# Patient Record
Sex: Male | Born: 2000
Health system: Southern US, Academic
[De-identification: ages and names within clinical notes are randomized; demographics above are authoritative.]

## PROBLEM LIST (undated history)

## (undated) ENCOUNTER — Ambulatory Visit

## (undated) ENCOUNTER — Encounter

## (undated) ENCOUNTER — Encounter: Attending: Dermatology | Primary: Dermatology

## (undated) ENCOUNTER — Telehealth

## (undated) ENCOUNTER — Ambulatory Visit: Payer: Medicaid (Managed Care)

## (undated) ENCOUNTER — Telehealth
Attending: Student in an Organized Health Care Education/Training Program | Primary: Student in an Organized Health Care Education/Training Program

## (undated) ENCOUNTER — Ambulatory Visit: Payer: BLUE CROSS/BLUE SHIELD

## (undated) ENCOUNTER — Ambulatory Visit: Payer: BLUE CROSS/BLUE SHIELD | Attending: Dermatology | Primary: Dermatology

## (undated) ENCOUNTER — Ambulatory Visit
Payer: BLUE CROSS/BLUE SHIELD | Attending: Student in an Organized Health Care Education/Training Program | Primary: Student in an Organized Health Care Education/Training Program

## (undated) ENCOUNTER — Ambulatory Visit: Attending: Pharmacist | Primary: Pharmacist

## (undated) DIAGNOSIS — L309 Dermatitis, unspecified: Secondary | ICD-10-CM

## (undated) DIAGNOSIS — Z91018 Allergy to other foods: Secondary | ICD-10-CM

## (undated) DIAGNOSIS — J45909 Unspecified asthma, uncomplicated: Secondary | ICD-10-CM

## (undated) HISTORY — PX: OTHER SURGICAL HISTORY: SHX169

## (undated) HISTORY — DX: Allergy to other foods: Z91.018

## (undated) NOTE — *Deleted (*Deleted)
Asthma Continue Advair Diskus 250 mg-take 1 puff every 12 hours to prevent coughing or wheezing Ventolin 2 puffs every 4 hours if needed for wheezing or coughing spells or instead albuterol 0.083% 1 unit dose every 4 hours if needed Use Ventolin 2 puffs 5-15 minutes before exercise to decrease cough or wheeze  Allergic rhinitis Begin fluticasone 2 sprays per nostril once a day if needed for stuffy nose Begin saline nasal rinses as needed for nasal symptoms. Use this before any medicated nasal sprays for best result Begin Mucinex 600-1200 mg twice a day for and increase hydration as tolerated until your nasal symptoms are resolved Continue cetirizine 10 mg-take 1 tablet once a day only if needed for runny nose for itching   Atopic dermatitis Continue on the treatment of his eczema prescribed by his dermatologist Continue dupilumab 300 mg once every 2 weeks  Food allergy Continue to avoid fish, shellfish, peanut and lentils.  If he has an allergic reaction give Benadryl 50 mg every 4 hours and if he has life-threatening symptoms inject with EpiPen 0.3 mg  Continue the other medications as listed in the chart  Call the clinic if this treatment plan is not working well for you  Follow up in 2 months or sooner if needed 

---
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
## 2018-01-01 ENCOUNTER — Ambulatory Visit (INDEPENDENT_AMBULATORY_CARE_PROVIDER_SITE_OTHER): Payer: Medicaid Other

## 2018-01-01 DIAGNOSIS — L209 Atopic dermatitis, unspecified: Secondary | ICD-10-CM | POA: Diagnosis not present

## 2018-01-01 MED FILL — DUPIXENT (ML)/300MG/2ML/SOSY: DUPIXENT (ML)/300MG/2ML/SOSY | 28 days supply | Qty: 4 | Fill #4

## 2018-01-01 NOTE — Unmapped (Signed)
Spoke with Barry Bond.  He says Barry Bond is doing well on the Dupixent  His next dose is for next Wednesday-sending delivery out on Friday     [facility] Refill Coordination Note    Specialty Medication(s) to be Shipped:   Inflammatory Disorders: Dupixent    Other medication(s) to be shipped: n/a     Barry Bond., DOB: August 09, 2000  Phone: [phone] (home)   Shipping Address: [address] [place] [address]  [place] [address]    All above HIPAA information was verified with patient.     Completed refill call assessment today to schedule patient's medication shipment from the [facility] [phone]).       Specialty medication(s) and dose(s) confirmed: Regimen is correct and unchanged.   Changes to medications: Barry Bond reports no changes reported at this time.  Changes to insurance: No  Questions for the pharmacist: No    The patient will receive an FSI print out for each medication shipped and additional FDA Medication Guides as required.  Patient education from [place] or Barry Bond may also be included in the shipment.    DISEASE-SPECIFIC INFORMATION        For Inflammatory disorders patients on injectable medications: Patient currently has 0 doses left.  Next injection is scheduled for 7/17.    ADHERENCE     Medication Adherence    Patient reported X missed doses in the last month:  0  Specialty Medication:  DUPIXENT  Patient is on additional specialty medications:  No  Patient is on more than two specialty medications:  No  Any gaps in refill history greater than 2 weeks in the last 3 months:  no  Demonstrates understanding of importance of adherence:  yes  Informant:  father  Reliability of informant:  reliable  Support network for adherence:  family member  Confirmed plan for next specialty medication refill:  delivery by pharmacy  Refills needed for supportive medications:  not needed          Refill Coordination    Has the Patients' Contact Information Changed: No  Is the Shipping Address Different:  No         SHIPPING     Shipping address confirmed in FSI.     Delivery Scheduled: Yes, Expected medication delivery date: 7/12 via UPS or courier.     Barry Bond   [facility] Shared [facility] Specialty Technician

## 2018-01-01 NOTE — Progress Notes (Signed)
Patient came in for Dupixent injection.  Gave 300 mg subcutaneous right arm.  Patient waited in clinic x 30 minutes.  No reaction noted.  Next appointment scheduled for 01/15/18.  Patient has EpiPen 0.3 mg available if needed.

## 2018-01-15 ENCOUNTER — Ambulatory Visit (INDEPENDENT_AMBULATORY_CARE_PROVIDER_SITE_OTHER): Payer: Medicaid Other

## 2018-01-15 DIAGNOSIS — L209 Atopic dermatitis, unspecified: Secondary | ICD-10-CM | POA: Diagnosis not present

## 2018-01-15 NOTE — Progress Notes (Signed)
Immunotherapy   Patient Details  Name: Travis Morrison MRN: [id number] Date of Birth: May 01, 2001  01/15/2018  Travis Morrison  DUPIXENT 300 MG/2ML SUBCUTANEOUS INJECTION.  GIVEN 2 ML LEFT ARM.   Following schedule: EVERY 2 WEEKS  Frequency:EVERY 2 WEEKS Epi-Pen:PATIENT STATES HE HAS EPI-PEN AND BENADRYL AVAILABLE IF NEEDED. Consent signed and patient instructions given. Patient waited in clinic x 30 minutes.  No reaction noted.  Naveah Brave Michail JewelsMarsh 01/15/2018,

## 2018-01-29 ENCOUNTER — Ambulatory Visit (INDEPENDENT_AMBULATORY_CARE_PROVIDER_SITE_OTHER): Payer: Medicaid Other

## 2018-01-29 DIAGNOSIS — L209 Atopic dermatitis, unspecified: Secondary | ICD-10-CM | POA: Diagnosis not present

## 2018-01-29 NOTE — Progress Notes (Signed)
Pt given Dupixent 300 mg/642ml today with no problems

## 2018-01-31 NOTE — Unmapped (Signed)
Spoke with Barry Bond's father  He is doing well on the dupixent so far  They have one left to use this Wednesday    [facility] Refill Coordination Note    Specialty Medication(s) to be Shipped:   Inflammatory Disorders: Dupixent    Other medication(s) to be shipped: n/a     Barry Bond., DOB: [birth year]  Phone: [phone] (home)   Shipping Address: [address]  [place] [address]    All above HIPAA information was verified with patient's father     Completed refill call assessment today to schedule patient's medication shipment from the [facility] [phone]).       Specialty medication(s) and dose(s) confirmed: Regimen is correct and unchanged.   Changes to medications: [name] reports no changes reported at this time.  Changes to insurance: No  Questions for the pharmacist: No    The patient will receive an FSI print out for each medication shipped and additional FDA Medication Guides as required.  Patient education from [place] or Barry Bond may also be included in the shipment.    DISEASE/MEDICATION-SPECIFIC INFORMATION        For Inflammatory disorders patients on injectable medications: Patient currently has 1 doses left.  Next injection is scheduled for wednesday.    ADHERENCE     Medication Adherence    Patient reported X missed doses in the last month:  0  Specialty Medication:  DUPIXENT  Patient is on additional specialty medications:  No  Patient is on more than two specialty medications:  No  Any gaps in refill history greater than 2 weeks in the last 3 months:  no  Demonstrates understanding of importance of adherence:  yes  Informant:  father  Reliability of informant:  reliable  Support network for adherence:  family member  Confirmed plan for next specialty medication refill:  delivery by pharmacy  Refills needed for supportive medications:  not needed          Refill Coordination    Has the Patients' Contact Information Changed:  No Is the Shipping Address Different:  No           SHIPPING     Shipping address confirmed in Epic.     Delivery Scheduled: Yes, Expected medication delivery date: 8/13 via UPS or courier.     Barry Bond   [facility] Shared [facility] Specialty Technician

## 2018-02-03 MED FILL — DUPIXENT 300 MG/2 ML SUBCUTANEOUS SYRINGE: 28 days supply | Qty: 4 | Fill #0 | Status: AC

## 2018-02-03 MED FILL — DUPIXENT 300 MG/2 ML SUBCUTANEOUS SYRINGE: 28 days supply | Qty: 4 | Fill #0

## 2018-02-12 ENCOUNTER — Ambulatory Visit (INDEPENDENT_AMBULATORY_CARE_PROVIDER_SITE_OTHER): Payer: Medicaid Other | Admitting: *Deleted

## 2018-02-12 DIAGNOSIS — L209 Atopic dermatitis, unspecified: Secondary | ICD-10-CM

## 2018-02-18 ENCOUNTER — Encounter: Payer: Self-pay | Admitting: Pediatrics

## 2018-02-18 ENCOUNTER — Ambulatory Visit (INDEPENDENT_AMBULATORY_CARE_PROVIDER_SITE_OTHER): Payer: Medicaid Other | Admitting: Pediatrics

## 2018-02-18 VITALS — BP 124/70 | HR 72 | Temp 97.7°F | Resp 12 | Ht 66.0 in | Wt 180.1 lb

## 2018-02-18 DIAGNOSIS — L2089 Other atopic dermatitis: Secondary | ICD-10-CM

## 2018-02-18 DIAGNOSIS — J4540 Moderate persistent asthma, uncomplicated: Secondary | ICD-10-CM | POA: Diagnosis not present

## 2018-02-18 DIAGNOSIS — T7800XD Anaphylactic reaction due to unspecified food, subsequent encounter: Secondary | ICD-10-CM

## 2018-02-18 DIAGNOSIS — J301 Allergic rhinitis due to pollen: Secondary | ICD-10-CM

## 2018-02-18 MED ORDER — EPINEPHRINE 0.3 MG/0.3ML IJ SOAJ: INTRAMUSCULAR | 1 refills | Status: DC

## 2018-02-18 MED ORDER — FLUTICASONE-SALMETEROL 250-50 MCG/DOSE IN AEPB: INHALATION_SPRAY | RESPIRATORY_TRACT | 5 refills | Status: DC

## 2018-02-18 MED ORDER — CETIRIZINE HCL 10 MG PO TABS: ORAL_TABLET | ORAL | 5 refills | Status: DC

## 2018-02-18 MED ORDER — ALBUTEROL SULFATE HFA 108 (90 BASE) MCG/ACT IN AERS: 2.0000 | INHALATION_SPRAY | RESPIRATORY_TRACT | 1 refills | Status: DC | PRN

## 2018-02-18 MED ORDER — ALBUTEROL SULFATE (2.5 MG/3ML) 0.083% IN NEBU: INHALATION_SOLUTION | RESPIRATORY_TRACT | 1 refills | Status: DC

## 2018-02-18 MED ORDER — FLUTICASONE PROPIONATE 50 MCG/ACT NA SUSP: NASAL | 5 refills | Status: DC

## 2018-02-18 NOTE — Progress Notes (Signed)
[address] [place]  [id number] Dept: [phone]  FOLLOW UP NOTE  Patient ID: Travis Saunaslejandro Deahl, male    DOB: 05-05-2001  Age: 17 y.o. MRN: [id number] Date of Office Visit: 02/18/2018  Assessment  Chief Complaint: Asthma (doing well) and Eczema (doing well)  HPI Travis Morrison presents for follow-up of asthma, allergic rhinitis, eczema and food allergies.  He has had an excellent response to dupilumab with regard to his asthma and eczema.  He now avoids fish, shellfish, peanut and lentils.  He is on Advair Diskus 250--1 puff every 12 hours and cetirizine 10 mg once a day.  [facility]   Drug Allergies:  Allergies  Allergen Reactions  . Cyproheptadine Other (See Comments)    Other Reaction: Other reaction Other Reaction: Other reaction   . Diphenhydramine Hcl Swelling  . Orange (Diagnostic) Other (See Comments)    Other Reaction: Other reaction  . Peanuts [Peanut Oil] Anaphylaxis    Pt has an epi pen  . Soy Allergy Anaphylaxis  . Citric Acid Other (See Comments)  . Eggs Or Egg-Derived Products Other (See Comments)  . Fish Allergy     Unknown  . Fish-Derived Products Other (See Comments)    Unknown Other reaction(s): UNKNOWN   . Hydroxyzine Other (See Comments)  . Lactose Intolerance (Gi)   . Other     Grains: Unknown  . Shellfish Allergy     Unknown Unknown  . Wheat Extract Other (See Comments)    Other Reaction: Not Assessed  . Cephalexin Rash  . Nickel Other (See Comments) and Rash    Other Reaction: Not Assessed     Physical Exam: BP 124/70 (BP Location: Left Arm, Patient Position: Sitting, Cuff Size: Normal)   Pulse 72   Temp 97.7 F (36.5 C) (Oral)   Resp 12   Ht 5\' 6"  (1.676 m)   Wt 180 lb 1.9 oz (81.7 kg)   SpO2 100%   BMI 29.07 kg/m    Physical Exam  Constitutional: He is oriented to person, place, and time. He appears well-developed and well-nourished.  HENT:  Eyes  normal.  Ears normal.  Nose normal.  Pharynx normal.  Neck: Neck supple.  Cardiovascular:  S1-S2 normal no murmurs  Pulmonary/Chest:  Clear to percussion and auscultation  Lymphadenopathy:    He has no cervical adenopathy.  Neurological: He is alert and oriented to person, place, and time.  Skin:  Dry.  Very mild eczematoid changes on his face arms and legs  Psychiatric: He has a normal mood and affect. His behavior is normal. Judgment and thought content normal.  Vitals reviewed.   Diagnostics: FVC 4.05 L FEV1 3.84 L.  Predicted FVC 4.85 L predicted FEV1 4.18 L-the spirometry is in the normal range  Assessment and Plan: 1. Moderate persistent asthma without complication   2. Anaphylactic shock due to food, subsequent encounter   3. Flexural atopic dermatitis   4. Seasonal allergic rhinitis due to pollen     Meds ordered this encounter  Medications  . EPINEPHrine (EPIPEN 2-PAK) 0.3 mg/0.3 mL IJ SOAJ injection    Sig: Use as directed for severe allergic reactions    Dispense:  4 Device    Refill:  1    Dispense mylan generic brand only.  Marland Kitchen. albuterol (PROVENTIL) (2.5 MG/3ML) 0.083% nebulizer solution    Sig: 1 vial in nebulizer every 4 hours as needed for coughing or wheezing.  Dispense:  75 mL    Refill:  1  . cetirizine (ZYRTEC) 10 MG tablet    Sig: 1 tablet once a day if needed for runny nose or itchy eyes    Dispense:  34 tablet    Refill:  5  . fluticasone (FLONASE) 50 MCG/ACT nasal spray    Sig: 1 spray per nostril once a day if needed for stuffy nose.    Dispense:  16 g    Refill:  5  . albuterol (VENTOLIN HFA) 108 (90 Base) MCG/ACT inhaler    Sig: Inhale 2 puffs into the lungs every 4 (four) hours as needed.    Dispense:  2 Inhaler    Refill:  1    One for home and school..  . Fluticasone-Salmeterol (ADVAIR DISKUS) 250-50 MCG/DOSE AEPB    Sig: One puff twice a day to prevent coughing or wheezing. Rinse, gargle and spit out after use    Dispense:  1 each     Refill:  5    Patient Instructions  Cetirizine 10 mg-take 1 tablet once a day if needed for runny nose for itching Fluticasone 2 sprays per nostril once a day if needed for stuffy nose  Advair Diskus 250 mg-take 1 puff every 12 hours to prevent coughing or wheezing Ventolin 2 puffs every 4 hours if needed for wheezing or coughing spells or instead albuterol 0.083% 1 unit dose every 4 hours if needed Continue on the treatment of his eczema prescribed by his dermatologist Continue dupilumab  Avoid fish, shellfish, peanut and lentils.  If he has an allergic reaction give Benadryl 50 mg every 4 hours and if he has life-threatening symptoms inject with EpiPen 0.3 mg   Return in about 6 months (around 08/21/2018).    Thank you for the opportunity to care for this patient.  Please do not hesitate to contact me with questions.  Tonette Bihari, M.D.  [facility] [address] [place], [place] [address] [phone]

## 2018-02-18 NOTE — Patient Instructions (Addendum)
Cetirizine 10 mg-take 1 tablet once a day if needed for runny nose for itching Fluticasone 2 sprays per nostril once a day if needed for stuffy nose  Advair Diskus 250 mg-take 1 puff every 12 hours to prevent coughing or wheezing Ventolin 2 puffs every 4 hours if needed for wheezing or coughing spells or instead albuterol 0.083% 1 unit dose every 4 hours if needed Continue on the treatment of his eczema prescribed by his dermatologist Continue dupilumab  Avoid fish, shellfish, peanut and lentils.  If he has an allergic reaction give Benadryl 50 mg every 4 hours and if he has life-threatening symptoms inject with EpiPen 0.3 mg

## 2018-02-26 ENCOUNTER — Ambulatory Visit (INDEPENDENT_AMBULATORY_CARE_PROVIDER_SITE_OTHER): Payer: Medicaid Other

## 2018-02-26 DIAGNOSIS — L209 Atopic dermatitis, unspecified: Secondary | ICD-10-CM | POA: Diagnosis not present

## 2018-02-26 NOTE — Progress Notes (Signed)
Immunotherapy   Patient Details  Name: Travis Morrison MRN: [id number] Date of Birth: [birth year]  02/26/2018  [name] received Dupixent inj today  Frequency: next appointment in 2 weeks Epi-Pen:Epi-Pen Available  Consent signed and patient instructions given.   Damita Gainey 02/26/2018, 9:26 AM

## 2018-03-05 NOTE — Unmapped (Signed)
Fadi's father reported he is doing well, and only occasionally needs topical steroids for minor eczema spots. He is happy with treatment, no medication issues identified.    [facility] Special[facility] Pharmacy Refill and Clinical Coordination Note  Medication(s): Dupixent    Ioane Bhola., DOB: Mar 30, 2001  Phone: [phone] (home) , Alternate phone contact: N/A  Shipping address: [address]  [place] [address]  Phone or address changes today?: No  All above HIPAA information verified.  Insurance changes? No    Completed refill and clinical call assessment today to schedule patient's medication shipment from the [facility] [phone]).      MEDICATION RECONCILIATION    Confirmed the medication and dosage are correct and have not changed: Yes, regimen is correct and unchanged.    Were there any changes to your medication(s) in the past month:  No, there are no changes reported at this time.    ADHERENCE    Is this medicine transplant or covered by Medicare Part B? No.    Did you miss any doses in the past 4 weeks? No missed doses reported.  Adherence counseling provided? Not needed     SIDE EFFECT MANAGEMENT    Are you tolerating your medication?:  Eythan reports tolerating the medication.  Side effect management discussed: None      Therapy is appropriate and should be continued.    Evidence of clinical benefit: See Epic note from 12/02/17      FINANCIAL/SHIPPING    Delivery Scheduled: Yes, Expected medication delivery date: Friday, Sept 13     Additional medications refilled: No additional medications/refills needed at this time.    The patient will receive a drug information handout for each medication shipped and additional FDA Medication Guides as required.      Donat did not have any additional questions at this time.    Delivery address confirmed in Epic.     We will follow up with patient monthly for standard refill processing and delivery.      Thank you, Tawanna Solo [facility] Specialty Pharmacist

## 2018-03-06 MED FILL — DUPIXENT 300 MG/2 ML SUBCUTANEOUS SYRINGE: 28 days supply | Qty: 4 | Fill #1

## 2018-03-06 MED FILL — DUPIXENT 300 MG/2 ML SUBCUTANEOUS SYRINGE: 28 days supply | Qty: 4 | Fill #1 | Status: AC

## 2018-03-12 ENCOUNTER — Ambulatory Visit (INDEPENDENT_AMBULATORY_CARE_PROVIDER_SITE_OTHER): Payer: Medicaid Other

## 2018-03-12 DIAGNOSIS — L209 Atopic dermatitis, unspecified: Secondary | ICD-10-CM | POA: Diagnosis not present

## 2018-03-12 NOTE — Progress Notes (Signed)
Immunotherapy   Patient Details  Name: Travis Morrison MRN: [id number] Date of Birth: [birth year]  03/12/2018  Travis Morrison  dupixent 300mg /172ml given in the office Following schedule: every 2 weeks  Frequency:every 2 weeks Epi-Pen:Epi-Pen Available  Consent signed and patient instructions given.   Murray HodgkinsMichelle Ernesto Zukowski 03/12/2018, 3:49 PM

## 2018-03-26 ENCOUNTER — Ambulatory Visit (INDEPENDENT_AMBULATORY_CARE_PROVIDER_SITE_OTHER): Payer: Medicaid Other

## 2018-03-26 DIAGNOSIS — L209 Atopic dermatitis, unspecified: Secondary | ICD-10-CM | POA: Diagnosis not present

## 2018-03-26 NOTE — Unmapped (Signed)
[facility] Specialty Pharmacy Refill Coordination Note  Specialty Medication(s): Dupixent 300mg /60ml  Additional Medications shipped: none    Barry Bond., DOB: 08/26/2000  Phone: [phone] (home) , Alternate phone contact: N/A  Phone or address changes today?: No  All above HIPAA information was verified with patient's family member.  Shipping Address: [address]  [name] [address]   Insurance changes? No    Completed refill call assessment today to schedule patient's medication shipment from the [facility] [phone]).      Confirmed the medication and dosage are correct and have not changed: Yes, regimen is correct and unchanged.    Confirmed patient started or stopped the following medications in the past month:  No, there are no changes reported at this time.    Are you tolerating your medication?:  Cobey reports tolerating the medication.    ADHERENCE    Did you miss any doses in the past 4 weeks? No missed doses reported.    FINANCIAL/SHIPPING    Delivery Scheduled: Yes, Expected medication delivery date: 04/04/18     The patient will receive a drug information handout for each medication shipped and additional FDA Medication Guides as required.      Ashraf did not have any additional questions at this time.    Delivery address validated in Epic.    We will follow up with patient monthly for standard refill processing and delivery.      Thank you,  Lupita Shutter   [facility] Specialty Pharmacist

## 2018-03-26 NOTE — Progress Notes (Signed)
Immunotherapy   Patient Details  Name: Trinton Prewitt MRN: [id number] Date of Birth: 2000-08-22  03/26/2018  [name] DUPIXENT 300 MG EVERY 14 DAYS Following schedule: EVERY 14 DAYS  Frequency:EVERY 14 DAYS Epi-Pen:PATIENT HAS EPIPEN 0.3 MG AVAILABLE IF NEEDED Consent signed and patient instructions given.  Patient waited in clinic x 30 minutes.  No reaction noted.  Next injection 04/09/18.   Kassey Laforest 03/26/2018, 9:35 AM

## 2018-04-03 MED FILL — DUPIXENT 300 MG/2 ML SUBCUTANEOUS SYRINGE: 28 days supply | Qty: 4 | Fill #2 | Status: AC

## 2018-04-03 MED FILL — DUPIXENT 300 MG/2 ML SUBCUTANEOUS SYRINGE: 28 days supply | Qty: 4 | Fill #2

## 2018-04-09 ENCOUNTER — Ambulatory Visit (INDEPENDENT_AMBULATORY_CARE_PROVIDER_SITE_OTHER): Payer: Medicaid Other | Admitting: *Deleted

## 2018-04-09 DIAGNOSIS — L209 Atopic dermatitis, unspecified: Secondary | ICD-10-CM | POA: Diagnosis not present

## 2018-04-23 ENCOUNTER — Ambulatory Visit (INDEPENDENT_AMBULATORY_CARE_PROVIDER_SITE_OTHER): Payer: Medicaid Other | Admitting: *Deleted

## 2018-04-23 DIAGNOSIS — L209 Atopic dermatitis, unspecified: Secondary | ICD-10-CM | POA: Diagnosis not present

## 2018-04-28 NOTE — Unmapped (Signed)
[facility] Specialty Pharmacy Refill Coordination Note    Specialty Medication(s) to be Shipped:   Inflammatory Disorders: Dupixent    Other medication(s) to be shipped: n/a     Barry Bond., DOB: Sep 26, 2000  Phone: [phone] (home)       All above HIPAA information was verified with Barry Bond's father     Completed refill call assessment today to schedule patient's medication shipment from the [facility] [phone]).       Specialty medication(s) and dose(s) confirmed: Regimen is correct and unchanged.   Changes to medications: Barry Bond reports no changes reported at this time.  Changes to insurance: No  Questions for the pharmacist: No    The patient will receive a drug information handout for each medication shipped and additional FDA Medication Guides as required.      DISEASE/MEDICATION-SPECIFIC INFORMATION        For Inflammatory disorders patients on injectable medications: Patient currently has 0 doses left.  Next injection is scheduled for next week.    ADHERENCE     Medication Adherence    Patient reported X missed doses in the last month:  0  Specialty Medication:  dupixent  Patient is on additional specialty medications:  No  Patient is on more than two specialty medications:  No  Any gaps in refill history greater than 2 weeks in the last 3 months:  no  Demonstrates understanding of importance of adherence:  yes  Informant:  father  Reliability of informant:  reliable          Support network for adherence:  family member      Confirmed plan for next specialty medication refill:  delivery by pharmacy  Refills needed for supportive medications:  not needed          Refill Coordination    Has the Patients' Contact Information Changed:  No  Is the Shipping Address Different:  No         SHIPPING     Shipping address confirmed in Epic.     Delivery Scheduled: Yes, Expected medication delivery date: 11/7 via UPS or courier.     Medication will be delivered via UPS to the home address in Epic WAM.    Barry Bond   Barry Bond [facility] Specialty Technician

## 2018-04-30 MED FILL — DUPIXENT 300 MG/2 ML SUBCUTANEOUS SYRINGE: 28 days supply | Qty: 4 | Fill #3 | Status: AC

## 2018-04-30 MED FILL — DUPIXENT 300 MG/2 ML SUBCUTANEOUS SYRINGE: 28 days supply | Qty: 4 | Fill #3

## 2018-05-07 ENCOUNTER — Ambulatory Visit (INDEPENDENT_AMBULATORY_CARE_PROVIDER_SITE_OTHER): Payer: Medicaid Other | Admitting: *Deleted

## 2018-05-07 DIAGNOSIS — L209 Atopic dermatitis, unspecified: Secondary | ICD-10-CM

## 2018-05-21 ENCOUNTER — Ambulatory Visit (INDEPENDENT_AMBULATORY_CARE_PROVIDER_SITE_OTHER): Payer: Medicaid Other

## 2018-05-21 DIAGNOSIS — L209 Atopic dermatitis, unspecified: Secondary | ICD-10-CM | POA: Diagnosis not present

## 2018-05-30 NOTE — Unmapped (Signed)
[facility] Specialty Pharmacy Refill Coordination Note  Specialty Medication(s): Dupixent 300 mg/2 mL  Additional Medications shipped: none    [name]., DOB: [birth year]  Phone: [phone] (home) , Alternate phone contact: N/A  Phone or address changes today?: No  All above HIPAA information was verified with patient's family member.  Shipping Address: [address]  [name] [address]   Insurance changes? No    Completed refill call assessment today to schedule patient's medication shipment from the [facility] [phone]).      Confirmed the medication and dosage are correct and have not changed: Yes, regimen is correct and unchanged.    Confirmed patient started or stopped the following medications in the past month:  No, there are no changes reported at this time.    Are you tolerating your medication?:  Prabhav reports tolerating the medication.    ADHERENCE    Did you miss any doses in the past 4 weeks? No missed doses reported.    FINANCIAL/SHIPPING    Delivery Scheduled: Yes, Expected medication delivery date: 06/03/18     Medication will be delivered via UPS to the home address in [facility].    The patient will receive a drug information handout for each medication shipped and additional FDA Medication Guides as required.      Tayjon did not have any additional questions at this time.    We will follow up with patient monthly for standard refill processing and delivery.      Thank you,  Verdia Kuba   [facility] Specialty Pharmacist

## 2018-06-02 MED FILL — DUPIXENT 300 MG/2 ML SUBCUTANEOUS SYRINGE: 28 days supply | Qty: 4 | Fill #4 | Status: AC

## 2018-06-02 MED FILL — DUPIXENT 300 MG/2 ML SUBCUTANEOUS SYRINGE: 28 days supply | Qty: 4 | Fill #4

## 2018-06-04 ENCOUNTER — Ambulatory Visit (INDEPENDENT_AMBULATORY_CARE_PROVIDER_SITE_OTHER): Payer: Medicaid Other

## 2018-06-04 DIAGNOSIS — L209 Atopic dermatitis, unspecified: Secondary | ICD-10-CM

## 2018-06-05 MED ORDER — CLOBETASOL 0.05 % TOPICAL OINTMENT
OPHTHALMIC | 1 refills | 0.00000 days | Status: CP
Start: 2018-06-05 — End: ?

## 2018-06-17 ENCOUNTER — Ambulatory Visit (INDEPENDENT_AMBULATORY_CARE_PROVIDER_SITE_OTHER): Payer: Medicaid Other

## 2018-06-17 DIAGNOSIS — L209 Atopic dermatitis, unspecified: Secondary | ICD-10-CM

## 2018-06-24 NOTE — Unmapped (Signed)
[facility] Specialty Pharmacy Refill Coordination Note    Specialty Medication(s) to be Shipped:   Inflammatory Disorders: Dupixent       Barry Bond., DOB: 24-Sep-2000  Phone: [phone] (home)       All above HIPAA information was verified with patient's family member.     Completed refill call assessment today to schedule patient's medication shipment from the [facility] [phone]).       Specialty medication(s) and dose(s) confirmed: Regimen is correct and unchanged.   Changes to medications: Younis reports no changes reported at this time.  Changes to insurance: No  Questions for the pharmacist: No    The patient will receive a drug information handout for each medication shipped and additional FDA Medication Guides as required.      DISEASE/MEDICATION-SPECIFIC INFORMATION        For patients on injectable medications: Patient currently has 0 doses left.  Next injection is scheduled for 07/01/2018.    ADHERENCE     Medication Adherence    Patient reported X missed doses in the last month:  0  Specialty Medication:  DUPIXENT 300 mg/2 mL Syrg injection  Patient is on additional specialty medications:  No  Patient is on more than two specialty medications:  No  Any gaps in refill history greater than 2 weeks in the last 3 months:  no  Demonstrates understanding of importance of adherence:  yes  Informant:  father  Reliability of informant:  reliable  Provider-estimated medication adherence level:  good          Support network for adherence:  family member      Confirmed plan for next specialty medication refill:  delivery by pharmacy          Refill Coordination    Has the Patients' Contact Information Changed:  No  Is the Shipping Address Different:  No         MEDICARE PART B DOCUMENTATION     DUPIXENT 300 mg/2 mL Syrg injection: Patient has none on hand.    SHIPPING     Shipping address confirmed in Epic.     Delivery Scheduled: Yes, Expected medication delivery date: 06/27/2018 via UPS or courier.     Medication will be delivered via UPS to the home address in Epic WAM.    Jorje Guild   [facility] Specialty Technician

## 2018-06-26 MED FILL — DUPIXENT 300 MG/2 ML SUBCUTANEOUS SYRINGE: 28 days supply | Qty: 4 | Fill #5

## 2018-06-26 MED FILL — DUPIXENT 300 MG/2 ML SUBCUTANEOUS SYRINGE: 28 days supply | Qty: 4 | Fill #5 | Status: AC

## 2018-07-01 ENCOUNTER — Ambulatory Visit (INDEPENDENT_AMBULATORY_CARE_PROVIDER_SITE_OTHER): Payer: Medicaid Other | Admitting: *Deleted

## 2018-07-01 DIAGNOSIS — L209 Atopic dermatitis, unspecified: Secondary | ICD-10-CM | POA: Diagnosis not present

## 2018-07-01 NOTE — Progress Notes (Signed)
Immunotherapy   Patient Details  Name: Travis Morrison MRN: [id number] Date of Birth: 2000-12-18  07/01/2018  [name] received Dupixent 2 mL in the left arm.  Frequency: Every 2 weeks Epi-Pen:Epi-Pen Available  Consent signed and patient instructions given. Pt left without waiting 30 minutes.   [name] 07/01/2018, 9:32 AM

## 2018-07-08 NOTE — Unmapped (Signed)
Father aware rx has expired and we're sending a refill request     [facility] Specialty Pharmacy Refill Coordination Note    Specialty Medication(s) to be Shipped:   Inflammatory Disorders: Dupixent    Other medication(s) to be shipped: n/a     Barry Bond., DOB: 10-08-2000  Phone: [phone] (home)       All above HIPAA information was verified with Barry Bond     Completed refill call assessment today to schedule patient's medication shipment from the [facility] [phone]).       Specialty medication(s) and dose(s) confirmed: Regimen is correct and unchanged.   Changes to medications: Shondell reports no changes reported at this time.  Changes to insurance: No  Questions for the pharmacist: No    Confirmed patient received Welcome Packet with first shipment. The patient will receive a drug information handout for each medication shipped and additional FDA Medication Guides as required.       DISEASE/MEDICATION-SPECIFIC INFORMATION        For patients on injectable medications: Patient currently has 1 doses left.  Next injection is scheduled for next wed 1/22.    SPECIALTY MEDICATION ADHERENCE     Medication Adherence    Patient reported X missed doses in the last month:  0  Specialty Medication:  dupixent  Patient is on additional specialty medications:  No  Patient is on more than two specialty medications:  No  Any gaps in refill history greater than 2 weeks in the last 3 months:  no  Demonstrates understanding of importance of adherence:  yes  Informant:  father  Reliability of informant:  reliable          Support network for adherence:  family member      Confirmed plan for next specialty medication refill:  delivery by pharmacy  Refills needed for supportive medications:  not needed          Refill Coordination    Has the Patients' Contact Information Changed:  No  Is the Shipping Address Different:  No           dupixent 300mg /40ml: patient has 14 days of medication on hand SHIPPING     Shipping address confirmed in Epic.     Delivery Scheduled: Yes, Expected medication delivery date: 1/24.  However, Rx request for refills was sent to the provider as there are none remaining.     Medication will be delivered via UPS to the home address in Epic WAM.    Barry Bond   [facility] Specialty Technician

## 2018-07-09 MED ORDER — DUPILUMAB 300 MG/2 ML SUBCUTANEOUS SYRINGE
SUBCUTANEOUS | 7 refills | 0.00000 days | Status: CP
Start: 2018-07-09 — End: 2019-07-09
  Filled 2018-07-17: qty 4, 28d supply, fill #0

## 2018-07-15 ENCOUNTER — Ambulatory Visit (INDEPENDENT_AMBULATORY_CARE_PROVIDER_SITE_OTHER): Payer: Medicaid Other

## 2018-07-15 DIAGNOSIS — L209 Atopic dermatitis, unspecified: Secondary | ICD-10-CM | POA: Diagnosis not present

## 2018-07-15 NOTE — Progress Notes (Signed)
Dupixent 300 mg given today

## 2018-07-17 MED FILL — DUPIXENT 300 MG/2 ML SUBCUTANEOUS SYRINGE: 28 days supply | Qty: 4 | Fill #0 | Status: AC

## 2018-07-29 ENCOUNTER — Ambulatory Visit (INDEPENDENT_AMBULATORY_CARE_PROVIDER_SITE_OTHER): Payer: Medicaid Other

## 2018-07-29 DIAGNOSIS — L209 Atopic dermatitis, unspecified: Secondary | ICD-10-CM

## 2018-08-11 MED ORDER — CALCIUM CARBONATE-VITAMIN D3 600 MG (1,500 MG)-800 UNIT TABLET
ORAL_TABLET | ORAL | 3 refills | 0.00000 days | Status: CP
Start: 2018-08-11 — End: ?

## 2018-08-12 ENCOUNTER — Ambulatory Visit (INDEPENDENT_AMBULATORY_CARE_PROVIDER_SITE_OTHER): Payer: Medicaid Other

## 2018-08-12 DIAGNOSIS — L209 Atopic dermatitis, unspecified: Secondary | ICD-10-CM | POA: Diagnosis not present

## 2018-08-12 NOTE — Unmapped (Signed)
[facility] Specialty Pharmacy Clinical Assessment & Refill Coordination Note    Barry Kring., DOB: [birth year]  Phone: [phone] (home)     All above HIPAA information was verified with patient's caregiver.     Specialty Medication(s):   Inflammatory Disorders: Dupixent     Current Outpatient Medications   Medication Sig Dispense Refill   ??? albuterol (PROVENTIL HFA;VENTOLIN HFA) 90 mcg/actuation inhaler Inhale 1 puff every six (6) hours as needed.      ??? albuterol (PROVENTIL HFA;VENTOLIN HFA) 90 mcg/actuation inhaler Inhale 2 puffs.     ??? albuterol 2.5 mg /3 mL (0.083 %) nebulizer solution INHALE THE CONTENTS OF 1 VIAL EVERY 6 HOURS AS NEEDED FOR COUGH OR WHEEZE     ??? ALBUTEROL INHL   prn as needed     ??? beclomethasone (QVAR) 80 mcg/actuation inhaler Inhale 2 puffs Two (2) times a day.      ??? calcium carbonate-vitamin D3 600 mg(1,500mg ) -800 unit Tab TAKE 1 TABLET BY MOUTH EVERY DAY 90 tablet 3   ??? cetirizine (ZYRTEC) 10 MG tablet Take 1 tablet (10 mg total) by mouth daily. 90 tablet 3   ??? clobetasol (TEMOVATE) 0.05 % ointment APPLY TOPICALLY FOR ECZEMA TWICE DAILY UNTIL SMOOTH. 240 g 1   ??? dupilumab (DUPIXENT) 300 mg/2 mL Syrg injection INJECT THE CONTENTS OF 1 SYRINGE (300MG ) UNDER THE SKIN EVERY 14 DAYS 4 mL 7   ??? EPINEPHrine (EPIPEN) 0.3 mg/0.3 mL (1:1,000) injection      ??? EPINEPHrine (EPIPEN) 0.3 mg/0.3 mL injection Inject 0.3 mL (0.3 mg total) into the muscle once. for 1 dose In case of anaphylaxis 2 each 3   ??? fluticasone (FLONASE) 50 mcg/actuation nasal spray      ??? fluticasone-salmeterol (ADVAIR DISKUS) 250-50 mcg/dose diskus Inhale 1 puff daily. 60 each 0   ??? fluticasone-salmeterol (ADVAIR) 250-50 mcg/dose diskus Inhale 1 puff.     ??? predniSONE (DELTASONE) 10 MG tablet Take 1 tablet (10 mg total) by mouth daily. (Patient not taking: Reported on 12/02/2017) 30 tablet 0   ??? sulfamethoxazole-trimethoprim (BACTRIM DS) 800-160 mg per tablet Take one tablet by mouth daily 30 tablet 0 No current facility-administered medications for this visit.         Changes to medications: Barry Bond reports no changes reported at this time.    Allergies   Allergen Reactions   ??? Benadryl [Diphenhydramine Hcl] Swelling   ??? Peanut Swelling and Other (See Comments)     Face   ??? Peanut Oil Anaphylaxis     Pt has an epi pen  Pt has an epi pen   ??? Soy Anaphylaxis   ??? Cetirizine Other (See Comments)     Other reaction(s): Unknown   ??? Cyproheptadine Other (See Comments)     Other Reaction: Other reaction   ??? Fish Containing Products Other (See Comments)     Unknown  Other reaction(s): UNKNOWN   ??? Hydroxyzine Other (See Comments)   ??? Shellfish Containing Products      Unknown   ??? Nickel Rash       Changes to allergies: No    SPECIALTY MEDICATION ADHERENCE     Dupixent 300 mg/ml: 0 days of medicine on hand      Medication Adherence    Support network for adherence:  family member          Specialty medication(s) dose(s) confirmed: Regimen is correct and unchanged.     Are there any concerns with adherence? No  Adherence counseling provided? Not needed    CLINICAL MANAGEMENT AND INTERVENTION      Clinical Benefit Assessment:    Do you feel the medicine is effective or helping your condition? Yes    Clinical Benefit counseling provided? Not needed    Adverse Effects Assessment:    Are you experiencing any side effects? No    Are you experiencing difficulty administering your medicine? No    Quality of Life Assessment:    How many days over the past month did your Dupixent  keep you from your normal activities? For example, brushing your teeth or getting up in the morning. 0    Have you discussed this with your provider? Not needed    Therapy Appropriateness:    Is therapy appropriate? Yes, therapy is appropriate and should be continued    DISEASE/MEDICATION-SPECIFIC INFORMATION      For patients on injectable medications: Patient currently has 0 doses left.  Next injection is scheduled for 08/26/18.    PATIENT SPECIFIC NEEDS     ? Does the patient have any physical, cognitive, or cultural barriers? No    ? Is the patient high risk? No     ? Does the patient require a Care Management Plan? No     ? Does the patient require physician intervention or other additional services (i.e. nutrition, smoking cessation, social work)? No      SHIPPING     Specialty Medication(s) to be Shipped:   Inflammatory Disorders: Dupixent    Other medication(s) to be shipped: n/a     Changes to insurance: No    Delivery Scheduled: Yes, Expected medication delivery date: 08/20/18.     Medication will be delivered via UPS to the confirmed home address in [facility].    The patient will receive a drug information handout for each medication shipped and additional FDA Medication Guides as required.  Verified that patient has previously received a Conservation officer, historic buildings.    Michaela Broski Vangie Bicker   [facility] Specialty Pharmacist

## 2018-08-12 NOTE — Progress Notes (Signed)
                                                                        Immunotherapy   Patient Details  Name: Travis Morrison MRN: [id number] Date of Birth: [birth year]  08/12/2018 [name]  Patient received Dupixent 300 mg/12ml  today Following schedule:  Every 2 weeks Frequency: Every 2 weeks Epi-Pen: yes  No reaction noted today.  Patient made appointment for 2 week injection.  [name] [name] 08/12/2018, 9:58 AM

## 2018-08-19 MED FILL — DUPIXENT 300 MG/2 ML SUBCUTANEOUS SYRINGE: 28 days supply | Qty: 4 | Fill #1

## 2018-08-19 MED FILL — DUPIXENT 300 MG/2 ML SUBCUTANEOUS SYRINGE: 28 days supply | Qty: 4 | Fill #1 | Status: AC

## 2018-08-25 ENCOUNTER — Ambulatory Visit: Payer: Medicaid Other | Admitting: Pediatrics

## 2018-08-26 ENCOUNTER — Ambulatory Visit (INDEPENDENT_AMBULATORY_CARE_PROVIDER_SITE_OTHER): Payer: Medicaid Other | Admitting: *Deleted

## 2018-08-26 DIAGNOSIS — L209 Atopic dermatitis, unspecified: Secondary | ICD-10-CM

## 2018-09-05 NOTE — Unmapped (Signed)
[facility] Specialty Pharmacy Refill Coordination Note    Specialty Medication(s) to be Shipped:   Inflammatory Disorders: Dupixent    Other medication(s) to be shipped: na     Barry Bond., DOB: December 30, 2000  Phone: [phone] (home)       All above HIPAA information was verified with patient's family member.     Completed refill call assessment today to schedule patient's medication shipment from the [facility] [phone]).       Specialty medication(s) and dose(s) confirmed: Regimen is correct and unchanged.   Changes to medications: Barry Bond reports no changes reported at this time.  Changes to insurance: No  Questions for the pharmacist: No    Confirmed patient received Welcome Packet with first shipment. The patient will receive a drug information handout for each medication shipped and additional FDA Medication Guides as required.       DISEASE/MEDICATION-SPECIFIC INFORMATION        N/A    SPECIALTY MEDICATION ADHERENCE     Medication Adherence    Patient reported X missed doses in the last month:  0  Specialty Medication:  dupixent  Patient is on additional specialty medications:  No  Patient is on more than two specialty medications:  No  Any gaps in refill history greater than 2 weeks in the last 3 months:  no  Demonstrates understanding of importance of adherence:  yes  Informant:  father  Reliability of informant:  reliable  Support network for adherence:  family member  Confirmed plan for next specialty medication refill:  delivery by pharmacy  Refills needed for supportive medications:  not needed          Refill Coordination    Has the Patients' Contact Information Changed:  No  Is the Shipping Address Different:  No           dupixent injections. Patient's father states he has 1 pen remaining that he will take Tuesday      SHIPPING     Shipping address confirmed in Epic.     Delivery Scheduled: Yes, Expected medication delivery date: 032520.     Medication will be delivered via UPS to the home address in Epic WAM.    Barry Bond D Quaneisha Hanisch   [facility] Specialty Technician

## 2018-09-09 ENCOUNTER — Ambulatory Visit: Payer: Medicaid Other

## 2018-09-16 MED FILL — DUPIXENT 300 MG/2 ML SUBCUTANEOUS SYRINGE: 28 days supply | Qty: 4 | Fill #2 | Status: AC

## 2018-09-16 MED FILL — DUPIXENT 300 MG/2 ML SUBCUTANEOUS SYRINGE: 28 days supply | Qty: 4 | Fill #2

## 2018-09-23 ENCOUNTER — Ambulatory Visit (INDEPENDENT_AMBULATORY_CARE_PROVIDER_SITE_OTHER): Payer: Medicaid Other

## 2018-09-23 ENCOUNTER — Other Ambulatory Visit: Payer: Self-pay

## 2018-09-23 DIAGNOSIS — L209 Atopic dermatitis, unspecified: Secondary | ICD-10-CM | POA: Diagnosis not present

## 2018-10-06 NOTE — Unmapped (Signed)
Barry Bond    Specialty Medication(s) to be Shipped:   Inflammatory Disorders: Dupixent    Other medication(s) to be shipped: na     Barry Bond., DOB: 05-04-2001  Phone: [phone] (home)       All above HIPAA information was verified with patient.     Completed refill call assessment today to schedule patient's medication shipment from the [facility] [phone]).       Specialty medication(s) and dose(s) confirmed: Regimen is correct and unchanged.   Changes to medications: Barry Bond reports no changes at this time.  Changes to insurance: No  Questions for the pharmacist: No    Confirmed patient received Welcome Packet with first shipment. The patient will receive a drug information handout for each medication shipped and additional FDA Medication Guides as required.       DISEASE/MEDICATION-SPECIFIC INFORMATION        N/A    SPECIALTY MEDICATION ADHERENCE     Medication Adherence    Patient reported X missed doses in the last month:  0  Specialty Medication:  dupixent  Patient is on additional specialty medications:  No  Patient is on more than two specialty medications:  No  Any gaps in refill history greater than 2 weeks in the last 3 months:  no  Demonstrates understanding of importance of adherence:  yes  Informant:  patient  Reliability of informant:  reliable  Support network for adherence:  family member  Confirmed plan for next specialty medication refill:  delivery by pharmacy  Refills needed for supportive medications:  not needed          Refill Coordination    Has the Patients' Contact Information Changed:  No  Is the Shipping Address Different:  No         Dupixent. Patient has 1 pen that he will take this coming Tuesday      SHIPPING     Shipping address confirmed in Epic.     Delivery Scheduled: Yes, Expected medication delivery date: 042320.     Medication will be delivered via UPS to the home address in Epic WAM. Barry Bond   [facility] Specialty Technician

## 2018-10-07 ENCOUNTER — Other Ambulatory Visit: Payer: Self-pay

## 2018-10-07 ENCOUNTER — Ambulatory Visit (INDEPENDENT_AMBULATORY_CARE_PROVIDER_SITE_OTHER): Payer: Medicaid Other

## 2018-10-07 DIAGNOSIS — L209 Atopic dermatitis, unspecified: Secondary | ICD-10-CM | POA: Diagnosis not present

## 2018-10-15 MED FILL — DUPIXENT 300 MG/2 ML SUBCUTANEOUS SYRINGE: 28 days supply | Qty: 4 | Fill #3 | Status: AC

## 2018-10-15 MED FILL — DUPIXENT 300 MG/2 ML SUBCUTANEOUS SYRINGE: 28 days supply | Qty: 4 | Fill #3

## 2018-10-21 ENCOUNTER — Ambulatory Visit (INDEPENDENT_AMBULATORY_CARE_PROVIDER_SITE_OTHER): Payer: Medicaid Other

## 2018-10-21 ENCOUNTER — Other Ambulatory Visit: Payer: Self-pay

## 2018-10-21 DIAGNOSIS — L209 Atopic dermatitis, unspecified: Secondary | ICD-10-CM | POA: Diagnosis not present

## 2018-10-27 ENCOUNTER — Encounter: Payer: Self-pay | Admitting: Pediatrics

## 2018-10-27 ENCOUNTER — Other Ambulatory Visit: Payer: Self-pay

## 2018-10-27 ENCOUNTER — Ambulatory Visit (INDEPENDENT_AMBULATORY_CARE_PROVIDER_SITE_OTHER): Payer: Medicaid Other | Admitting: Pediatrics

## 2018-10-27 DIAGNOSIS — J4540 Moderate persistent asthma, uncomplicated: Secondary | ICD-10-CM | POA: Diagnosis not present

## 2018-10-27 DIAGNOSIS — L209 Atopic dermatitis, unspecified: Secondary | ICD-10-CM | POA: Diagnosis not present

## 2018-10-27 DIAGNOSIS — T7800XD Anaphylactic reaction due to unspecified food, subsequent encounter: Secondary | ICD-10-CM

## 2018-10-27 DIAGNOSIS — J301 Allergic rhinitis due to pollen: Secondary | ICD-10-CM

## 2018-10-27 MED ORDER — ALBUTEROL SULFATE 2.5 MG/3 ML (0.083 %) SOLUTION FOR NEBULIZATION
0.00000 days
Start: 2018-10-27 — End: ?

## 2018-10-27 MED ORDER — ALBUTEROL SULFATE HFA 108 (90 BASE) MCG/ACT IN AERS: 2.0000 | INHALATION_SPRAY | RESPIRATORY_TRACT | 2 refills | Status: DC | PRN

## 2018-10-27 MED ORDER — FLUTICASONE PROPIONATE 50 MCG/ACT NA SUSP: NASAL | 5 refills | Status: DC

## 2018-10-27 MED ORDER — ALBUTEROL SULFATE (2.5 MG/3ML) 0.083% IN NEBU: INHALATION_SOLUTION | RESPIRATORY_TRACT | 1 refills | Status: DC

## 2018-10-27 MED ORDER — CETIRIZINE HCL 10 MG PO TABS: ORAL_TABLET | ORAL | 5 refills | Status: DC

## 2018-10-27 MED ORDER — FLUTICASONE-SALMETEROL 250-50 MCG/DOSE IN AEPB: INHALATION_SPRAY | RESPIRATORY_TRACT | 5 refills | Status: DC

## 2018-10-27 MED ORDER — EPINEPHRINE 0.3 MG/0.3ML IJ SOAJ: INTRAMUSCULAR | 1 refills | Status: DC

## 2018-10-27 NOTE — Patient Instructions (Addendum)
Allergic rhinitis Continue cetirizine 10 mg-take 1 tablet once a day if needed for runny nose for itching Continue Fluticasone 2 sprays per nostril once a day if needed for stuffy nose  Asthma Continue Advair Diskus 250 mg-take 1 puff every 12 hours to prevent coughing or wheezing Ventolin 2 puffs every 4 hours if needed for wheezing or coughing spells or instead albuterol 0.083% 1 unit dose every 4 hours if needed Use Ventolin 2 puffs 5-15 minutes before exercise to decrease cough or wheeze  Atopic dermatitis Continue on the treatment of his eczema prescribed by his dermatologist Continue dupilumab 300 mg once every 2 weeks  Food allergy Continue to avoid fish, shellfish, peanut and lentils.  If he has an allergic reaction give Benadryl 50 mg every 4 hours and if he has life-threatening symptoms inject with EpiPen 0.3 mg  Continue the other medications as listed in the chart  Call the clinic if this treatment plan is not working well for you  Follow up in 6 months or sooner if needed

## 2018-10-27 NOTE — Progress Notes (Addendum)
RE: Travis Morrison MRN: [id number] DOB: 14-Mar-2001 Date of Telemedicine Visit: 10/27/2018  Referring provider: Clint Guy, MD Primary care provider: Clint Guy, MD  Chief Complaint: Eczema   Telemedicine Follow Up Visit via Telephone: I connected with Lindo Gislason for a follow up on 10/27/18 by telephone and verified that I am speaking with the correct person using two identifiers.   I discussed the limitations, risks, security and privacy concerns of performing an evaluation and management service by telephone and the availability of in person appointments. I also discussed with the patient that there may be a patient responsible charge related to this service. The patient expressed understanding and agreed to proceed.  Patient is at home accompanied by his father who provided/contributed to the history.  Provider is at the office.  Visit start time: 2:45 Visit end time: 3:12 Insurance consent/check in by: Jennette Banker Medical consent and medical assistant/nurse: Maryjean Morn  History of Present Illness: He is a 18 y.o. male, who is being followed for asthma, allergic rhinitis, atopic dermatitis, and food allergy to peanut, fish, shellfish, and lentils. His previous allergy office visit was on 02/18/2018 with Dr. Beaulah Dinning. He is accompanied by his father who assists with history. He reports that his asthma has been moderately well controlled with intermittent shortness of breath with activity while outside in the heat. He denies cough or wheeze. He is currently using Advair 250/50-1 puff twice a day and albuterol 2 times a week with relief of symptoms. He is not using albuterol before activity at this time. Allergic rhinitis is reported as well controlled with occasional runny nose. He continues cetirizine daily and Flonase as needed. Atopic dermatitis is reported as "one thousand times better" while continuing on Dupixent injections once every 2 weeks. He continues to avoid  peanut, tree nuts, fish, shellfish, and lentils and carry an EpiPen at all times. His current medications are listed in the chart.  Assessment and Plan: Allergic rhinitis Continue cetirizine 10 mg-take 1 tablet once a day if needed for runny nose for itching Continue Fluticasone 2 sprays per nostril once a day if needed for stuffy nose  Asthma Continue Advair Diskus 250 mg-take 1 puff every 12 hours to prevent coughing or wheezing Ventolin 2 puffs every 4 hours if needed for wheezing or coughing spells or instead albuterol 0.083% 1 unit dose every 4 hours if needed Use Ventolin 2 puffs 5-15 minutes before exercise to decrease cough or wheeze  Atopic dermatitis Continue on the treatment of eczema as prescribed by his dermatologist Continue dupilumab 300 mg once every 2 weeks  Food allergy Continue to avoid fish, shellfish, peanut and lentils.  If he has an allergic reaction give Benadryl 50 mg every 4 hours and if he has life-threatening symptoms inject with EpiPen 0.3 mg  Continue the other medications as listed in the chart  Call the clinic if this treatment plan is not working well for you  Follow up in 6 months or sooner if needed  Return in about 6 months (around 04/29/2019), or if symptoms worsen or fail to improve.  Meds ordered this encounter  Medications   EPINEPHrine (EPIPEN 2-PAK) 0.3 mg/0.3 mL IJ SOAJ injection    Sig: Use as directed for severe allergic reactions    Dispense:  2 Device    Refill:  1    Dispense mylan generic brand only.   Fluticasone-Salmeterol (ADVAIR DISKUS) 250-50 MCG/DOSE AEPB    Sig: One puff twice a day to prevent  coughing or wheezing. Rinse, gargle and spit out after use    Dispense:  60 each    Refill:  5   albuterol (VENTOLIN HFA) 108 (90 Base) MCG/ACT inhaler    Sig: Inhale 2 puffs into the lungs every 4 (four) hours as needed.    Dispense:  1 Inhaler    Refill:  2    One for home and school..   cetirizine (ZYRTEC) 10 MG tablet     Sig: 1 tablet once a day if needed for runny nose or itchy eyes    Dispense:  34 tablet    Refill:  5   albuterol (PROVENTIL) (2.5 MG/3ML) 0.083% nebulizer solution    Sig: 1 vial in nebulizer every 4 hours as needed for coughing or wheezing.    Dispense:  75 mL    Refill:  1   fluticasone (FLONASE) 50 MCG/ACT nasal spray    Sig: 1 spray per nostril once a day if needed for stuffy nose.    Dispense:  16 g    Refill:  5     Medication List:  Current Outpatient Medications  Medication Sig Dispense Refill   albuterol (PROVENTIL) (2.5 MG/3ML) 0.083% nebulizer solution 1 vial in nebulizer every 4 hours as needed for coughing or wheezing. 75 mL 1   albuterol (VENTOLIN HFA) 108 (90 Base) MCG/ACT inhaler Inhale 2 puffs into the lungs every 4 (four) hours as needed. 1 Inhaler 2   Calcium Carb-Cholecalciferol 600-800 MG-UNIT TABS Take one tablet daily     cetirizine (ZYRTEC) 10 MG tablet 1 tablet once a day if needed for runny nose or itchy eyes 34 tablet 5   clobetasol ointment (TEMOVATE) 0.05 % APPLY TOPICALLY FOR ECZEMA TWICE DAILY UNTIL SMOOTH.  1   Dupilumab 300 MG/2ML SOSY Inject 300 mg into the skin.     EPINEPHrine (EPIPEN 2-PAK) 0.3 mg/0.3 mL IJ SOAJ injection Use as directed for severe allergic reactions 2 Device 1   fluticasone (FLONASE) 50 MCG/ACT nasal spray 1 spray per nostril once a day if needed for stuffy nose. 16 g 5   Fluticasone-Salmeterol (ADVAIR DISKUS) 250-50 MCG/DOSE AEPB One puff twice a day to prevent coughing or wheezing. Rinse, gargle and spit out after use 60 each 5   No current facility-administered medications for this visit.    Allergies: Allergies  Allergen Reactions   Cyproheptadine Other (See Comments)    Other Reaction: Other reaction Other Reaction: Other reaction    Diphenhydramine Hcl Swelling   Orange (Diagnostic) Other (See Comments)    Other Reaction: Other reaction   Peanuts [Peanut Oil] Anaphylaxis    Pt has an epi pen   Soy  Allergy Anaphylaxis   Citric Acid Other (See Comments)   Eggs Or Egg-Derived Products Other (See Comments)   Fish Allergy     Unknown   Fish-Derived Products Other (See Comments)    Unknown Other reaction(s): UNKNOWN    Hydroxyzine Other (See Comments)   Lactose Intolerance (Gi)    Other     Grains: Unknown   Shellfish Allergy     Unknown Unknown   Wheat Extract Other (See Comments)    Other Reaction: Not Assessed   Cephalexin Rash   Nickel Other (See Comments) and Rash    Other Reaction: Not Assessed    I reviewed his past medical history, social history, family history, and environmental history and no significant changes have been reported from previous visit on 02/18/2018.  Objective: Physical Exam Not obtained  as encounter was done via telephone.   Previous notes and tests were reviewed.  I discussed the assessment and treatment plan with the patient. The patient was provided an opportunity to ask questions and all were answered. The patient agreed with the plan and demonstrated an understanding of the instructions.   The patient was advised to call back or seek an in-person evaluation if the symptoms worsen or if the condition fails to improve as anticipated.  I provided 27 minutes of non-face-to-face time during this encounter.  Thank you for the opportunity to care for this patient.  Please do not hesitate to contact me with questions.  Anne AmbsThermon LeylandAllergy and [facility]  I have provided oversight concerning Thermon Leyland' evaluation and treatment of this patient's health issues addressed during today's encounter. I agree with the assessment and therapeutic plan as outlined in the note.   It was my pleasure to participate in Ripley Tugwell's care today. Please feel free to contact me with any questions or concerns.   Sincerely,  Ginnie Smart, MD

## 2018-10-30 ENCOUNTER — Telehealth: Payer: Self-pay | Admitting: *Deleted

## 2018-10-30 NOTE — Telephone Encounter (Signed)
-----   Message from Hetty Blend, FNP sent at 10/27/2018  5:18 PM EDT ----- Hi Claus Silvestro, Can we please order Dupixent for this patient? He has been taking 300 mg every 2 weeks for eczema and was getting the Dupixent from [facility]. Thank you

## 2018-10-30 NOTE — Telephone Encounter (Signed)
I have left patient 2 messages this week with no callback so I could find out which specialty pharmacy he was using and submit rx but have not received return call yet

## 2018-10-31 NOTE — Telephone Encounter (Signed)
Ok. Thank you.

## 2018-11-04 ENCOUNTER — Ambulatory Visit (INDEPENDENT_AMBULATORY_CARE_PROVIDER_SITE_OTHER): Payer: Medicaid Other

## 2018-11-04 ENCOUNTER — Other Ambulatory Visit: Payer: Self-pay

## 2018-11-04 DIAGNOSIS — L209 Atopic dermatitis, unspecified: Secondary | ICD-10-CM

## 2018-11-04 NOTE — Progress Notes (Signed)
Pt received Dupixent today 

## 2018-11-06 NOTE — Unmapped (Signed)
[facility] Refill Coordination Note    Specialty Medication(s) to be Shipped:   Inflammatory Disorders: Dupixent    Other medication(s) to be shipped: na     Barry Bond., DOB: [birth year]  Phone: [phone] (home)       All above HIPAA information was verified with patient's family member.     Completed refill call assessment today to schedule patient's medication shipment from the [facility] [phone]).       Specialty medication(s) and dose(s) confirmed: Regimen is correct and unchanged.   Changes to medications: Lemar reports no changes at this time.  Changes to insurance: No  Questions for the pharmacist: No    Confirmed patient received Welcome Packet with first shipment. The patient will receive a drug information handout for each medication shipped and additional FDA Medication Guides as required.       DISEASE/MEDICATION-SPECIFIC INFORMATION        N/A    SPECIALTY MEDICATION ADHERENCE     Medication Adherence    Patient reported X missed doses in the last month:  0  Specialty Medication:  dupixent 300 mg/2 ml  Patient is on additional specialty medications:  No  Patient is on more than two specialty medications:  No  Any gaps in refill history greater than 2 weeks in the last 3 months:  no  Demonstrates understanding of importance of adherence:  yes  Informant:  father  Reliability of informant:  reliable  Support network for adherence:  family member  Confirmed plan for next specialty medication refill:  delivery by pharmacy  Refills needed for supportive medications:  not needed            dupixent 300 mg/2 ml. 0 on hand      SHIPPING     Shipping address confirmed in Epic.     Delivery Scheduled: Yes, Expected medication delivery date: 052120.     Medication will be delivered via UPS to the home address in Epic WAM.    Barry Bond   [facility] Specialty Technician

## 2018-11-12 MED FILL — DUPIXENT 300 MG/2 ML SUBCUTANEOUS SYRINGE: 28 days supply | Qty: 4 | Fill #4 | Status: AC

## 2018-11-12 MED FILL — DUPIXENT 300 MG/2 ML SUBCUTANEOUS SYRINGE: 28 days supply | Qty: 4 | Fill #4

## 2018-11-18 ENCOUNTER — Other Ambulatory Visit: Payer: Self-pay

## 2018-11-18 ENCOUNTER — Ambulatory Visit (INDEPENDENT_AMBULATORY_CARE_PROVIDER_SITE_OTHER): Payer: Medicaid Other

## 2018-11-18 DIAGNOSIS — L209 Atopic dermatitis, unspecified: Secondary | ICD-10-CM

## 2018-12-02 ENCOUNTER — Ambulatory Visit (INDEPENDENT_AMBULATORY_CARE_PROVIDER_SITE_OTHER): Payer: Medicaid Other

## 2018-12-02 ENCOUNTER — Other Ambulatory Visit: Payer: Self-pay

## 2018-12-02 DIAGNOSIS — L209 Atopic dermatitis, unspecified: Secondary | ICD-10-CM | POA: Diagnosis not present

## 2018-12-02 MED ORDER — DUPILUMAB 300 MG/2ML ~~LOC~~ SOSY
300.0000 mg | PREFILLED_SYRINGE | SUBCUTANEOUS | Status: DC
  Administered 2018-12-02 – 2020-07-13 (×39): 300 mg via SUBCUTANEOUS

## 2018-12-04 NOTE — Unmapped (Signed)
[facility] Refill Coordination Note    Specialty Medication(s) to be Shipped:   Inflammatory Disorders: Dupixent    Other medication(s) to be shipped: na     Barry Bond., DOB: 2001-01-02  Phone: [phone] (home)       All above HIPAA information was verified with patient.     Completed refill call assessment today to schedule patient's medication shipment from the [facility] [phone]).       Specialty medication(s) and dose(s) confirmed: Regimen is correct and unchanged.   Changes to medications: [name] reports no changes at this time.  Changes to insurance: No  Questions for the pharmacist: No    Confirmed patient received Welcome Packet with first shipment. The patient will receive a drug information handout for each medication shipped and additional FDA Medication Guides as required.       DISEASE/MEDICATION-SPECIFIC INFORMATION        N/A    SPECIALTY MEDICATION ADHERENCE     Medication Adherence    Patient reported X missed doses in the last month:  0  Specialty Medication:  dupixent 300 mg/2 ml  Patient is on additional specialty medications:  No  Patient is on more than two specialty medications:  No  Any gaps in refill history greater than 2 weeks in the last 3 months:  no  Demonstrates understanding of importance of adherence:  yes  Informant:  patient  Reliability of informant:  reliable  Support network for adherence:  family member  Confirmed plan for next specialty medication refill:  delivery by pharmacy  Refills needed for supportive medications:  not needed                dupixent 300 mg/2 ml. 0 on hand. Next dose is due 6/23      SHIPPING     Shipping address confirmed in Epic.     Delivery Scheduled: Yes, Expected medication delivery date: 061820.     Medication will be delivered via UPS to the home address in Epic WAM.    Thomasenia Dowse D Jakiera Ehler   [facility] Specialty Technician

## 2018-12-10 MED FILL — DUPIXENT 300 MG/2 ML SUBCUTANEOUS SYRINGE: 28 days supply | Qty: 4 | Fill #5

## 2018-12-10 MED FILL — DUPIXENT 300 MG/2 ML SUBCUTANEOUS SYRINGE: 28 days supply | Qty: 4 | Fill #5 | Status: AC

## 2018-12-16 ENCOUNTER — Other Ambulatory Visit: Payer: Self-pay

## 2018-12-16 ENCOUNTER — Ambulatory Visit (INDEPENDENT_AMBULATORY_CARE_PROVIDER_SITE_OTHER): Payer: Medicaid Other

## 2018-12-16 DIAGNOSIS — L2081 Atopic neurodermatitis: Secondary | ICD-10-CM

## 2018-12-30 ENCOUNTER — Other Ambulatory Visit: Payer: Self-pay

## 2018-12-30 ENCOUNTER — Ambulatory Visit (INDEPENDENT_AMBULATORY_CARE_PROVIDER_SITE_OTHER): Payer: Medicaid Other

## 2018-12-30 DIAGNOSIS — L209 Atopic dermatitis, unspecified: Secondary | ICD-10-CM | POA: Diagnosis not present

## 2019-01-01 NOTE — Unmapped (Signed)
[facility] Refill Coordination Note    Specialty Medication(s) to be Shipped:   Inflammatory Disorders: Dupixent    Other medication(s) to be shipped: na     Barry Bond., DOB: 2000-10-21  Phone: [phone] (home)       All above HIPAA information was verified with patient.     Completed refill call assessment today to schedule patient's medication shipment from the [facility] [phone]).       Specialty medication(s) and dose(s) confirmed: Regimen is correct and unchanged.   Changes to medications: [name] reports no changes at this time.  Changes to insurance: No  Questions for the pharmacist: No    Confirmed patient received Welcome Packet with first shipment. The patient will receive a drug information handout for each medication shipped and additional FDA Medication Guides as required.       DISEASE/MEDICATION-SPECIFIC INFORMATION        N/A    SPECIALTY MEDICATION ADHERENCE     Medication Adherence    Patient reported X missed doses in the last month:  0  Specialty Medication:  dupixent 300 mg/2 ml  Patient is on additional specialty medications:  No  Patient is on more than two specialty medications:  No  Any gaps in refill history greater than 2 weeks in the last 3 months:  no  Demonstrates understanding of importance of adherence:  yes  Informant:  patient  Reliability of informant:  reliable  Support network for adherence:  family member  Confirmed plan for next specialty medication refill:  delivery by pharmacy  Refills needed for supportive medications:  not needed                dupixent 300 mg/2 ml. 0 on hand. Next dose is due on 7/21       [facility]     Shipping address confirmed in Epic.     Delivery Scheduled: Yes, Expected medication delivery date: 071620.     Medication will be delivered via UPS to the home address in Epic WAM.    [name] D [name]   [facility] Specialty Technician

## 2019-01-07 MED FILL — DUPIXENT 300 MG/2 ML SUBCUTANEOUS SYRINGE: 28 days supply | Qty: 4 | Fill #6

## 2019-01-07 MED FILL — DUPIXENT 300 MG/2 ML SUBCUTANEOUS SYRINGE: 28 days supply | Qty: 4 | Fill #6 | Status: AC

## 2019-01-13 ENCOUNTER — Ambulatory Visit (INDEPENDENT_AMBULATORY_CARE_PROVIDER_SITE_OTHER): Payer: Medicaid Other

## 2019-01-13 DIAGNOSIS — L209 Atopic dermatitis, unspecified: Secondary | ICD-10-CM | POA: Diagnosis not present

## 2019-01-15 ENCOUNTER — Other Ambulatory Visit: Payer: Self-pay

## 2019-01-27 ENCOUNTER — Other Ambulatory Visit: Payer: Self-pay

## 2019-01-27 ENCOUNTER — Ambulatory Visit (INDEPENDENT_AMBULATORY_CARE_PROVIDER_SITE_OTHER): Payer: Medicaid Other

## 2019-01-27 DIAGNOSIS — L209 Atopic dermatitis, unspecified: Secondary | ICD-10-CM | POA: Diagnosis not present

## 2019-01-29 NOTE — Unmapped (Signed)
[facility] Clinical Assessment & Refill Coordination Note    Barry Bond., DOB: [birth year]  Phone: [phone] (home)     All above HIPAA information was verified with patient.     Specialty Medication(s):   Inflammatory Disorders: Dupixent     Current Outpatient Medications   Medication Sig Dispense Refill   ??? albuterol (PROVENTIL HFA;VENTOLIN HFA) 90 mcg/actuation inhaler Inhale 1 puff every six (6) hours as needed.      ??? albuterol (PROVENTIL HFA;VENTOLIN HFA) 90 mcg/actuation inhaler Inhale 2 puffs.     ??? albuterol 2.5 mg /3 mL (0.083 %) nebulizer solution INHALE THE CONTENTS OF 1 VIAL EVERY 6 HOURS AS NEEDED FOR COUGH OR WHEEZE     ??? ALBUTEROL INHL   prn as needed     ??? beclomethasone (QVAR) 80 mcg/actuation inhaler Inhale 2 puffs Two (2) times a day.      ??? calcium carbonate-vitamin D3 600 mg(1,500mg ) -800 unit Tab TAKE 1 TABLET BY MOUTH EVERY DAY 90 tablet 3   ??? cetirizine (ZYRTEC) 10 MG tablet Take 1 tablet (10 mg total) by mouth daily. 90 tablet 3   ??? clobetasol (TEMOVATE) 0.05 % ointment APPLY TOPICALLY FOR ECZEMA TWICE DAILY UNTIL SMOOTH. 240 g 1   ??? dupilumab (DUPIXENT) 300 mg/2 mL Syrg injection INJECT THE CONTENTS OF 1 SYRINGE (300MG ) UNDER THE SKIN EVERY 14 DAYS 4 mL 7   ??? EPINEPHrine (EPIPEN) 0.3 mg/0.3 mL (1:1,000) injection      ??? EPINEPHrine (EPIPEN) 0.3 mg/0.3 mL injection Inject 0.3 mL (0.3 mg total) into the muscle once. for 1 dose In case of anaphylaxis 2 each 3   ??? fluticasone (FLONASE) 50 mcg/actuation nasal spray      ??? fluticasone-salmeterol (ADVAIR DISKUS) 250-50 mcg/dose diskus Inhale 1 puff daily. 60 each 0   ??? fluticasone-salmeterol (ADVAIR) 250-50 mcg/dose diskus Inhale 1 puff.     ??? predniSONE (DELTASONE) 10 MG tablet Take 1 tablet (10 mg total) by mouth daily. (Patient not taking: Reported on 12/02/2017) 30 tablet 0   ??? sulfamethoxazole-trimethoprim (BACTRIM DS) 800-160 mg per tablet Take one tablet by mouth daily 30 tablet 0     No current facility-administered medications for this visit.         Changes to medications: Barry Bond reports no changes at this time.    Allergies   Allergen Reactions   ??? Benadryl [Diphenhydramine Hcl] Swelling   ??? Peanut Swelling and Other (See Comments)     Face   ??? Peanut Oil Anaphylaxis     Pt has an epi pen  Pt has an epi pen   ??? Soy Anaphylaxis   ??? Cetirizine Other (See Comments)     Other reaction(s): Unknown   ??? Cyproheptadine Other (See Comments)     Other Reaction: Other reaction   ??? Fish Containing Products Other (See Comments)     Unknown  Other reaction(s): UNKNOWN   ??? Hydroxyzine Other (See Comments)   ??? Shellfish Containing Products      Unknown   ??? Nickel Rash       Changes to allergies: No    SPECIALTY MEDICATION ADHERENCE     Dupixent 300mg /63ml: 0 days of medicine on hand       Medication Adherence    Patient reported X missed doses in the last month: 0  Specialty Medication: Dupixent  Support network for adherence: family member          Specialty medication(s) dose(s) confirmed: Regimen  is correct and unchanged.     Are there any concerns with adherence? No    Adherence counseling provided? Not needed    CLINICAL MANAGEMENT AND INTERVENTION      Clinical Benefit Assessment:    Do you feel the medicine is effective or helping your condition? Yes    Clinical Benefit counseling provided? Not needed    Adverse Effects Assessment:    Are you experiencing any side effects? No    Are you experiencing difficulty administering your medicine? No    Quality of Life Assessment:    How many days over the past month did your atomic dermatitis  keep you from your normal activities? For example, brushing your teeth or getting up in the morning. 0    Have you discussed this with your provider? Not needed    Therapy Appropriateness:    Is therapy appropriate? Yes, therapy is appropriate and should be continued    DISEASE/MEDICATION-SPECIFIC INFORMATION      For patients on injectable medications: Patient currently has 0 doses left.  Next injection is scheduled for 8/18.    PATIENT SPECIFIC NEEDS     ? Does the patient have any physical, cognitive, or cultural barriers? No    ? Is the patient high risk? No     ? Does the patient require a Care Management Plan? No     ? Does the patient require physician intervention or other additional services (i.e. nutrition, smoking cessation, social work)? No      SHIPPING     Specialty Medication(s) to be Shipped:   Inflammatory Disorders: Dupixent    Other medication(s) to be shipped: n/a     Changes to insurance: No    Delivery Scheduled: Yes, Expected medication delivery date: 8/12.     Medication will be delivered via UPS to the confirmed home address in [facility].    The patient will receive a drug information handout for each medication shipped and additional FDA Medication Guides as required.  Verified that patient has previously received a Conservation officer, historic buildings.    All of the patient's questions and concerns have been addressed.    Clydell Hakim   [facility] Shared [facility] Specialty Pharmacist

## 2019-02-03 MED FILL — DUPIXENT 300 MG/2 ML SUBCUTANEOUS SYRINGE: 28 days supply | Qty: 4 | Fill #7 | Status: AC

## 2019-02-03 MED FILL — DUPIXENT 300 MG/2 ML SUBCUTANEOUS SYRINGE: 28 days supply | Qty: 4 | Fill #7

## 2019-02-10 ENCOUNTER — Ambulatory Visit (INDEPENDENT_AMBULATORY_CARE_PROVIDER_SITE_OTHER): Payer: Medicaid Other

## 2019-02-10 DIAGNOSIS — L209 Atopic dermatitis, unspecified: Secondary | ICD-10-CM

## 2019-02-25 ENCOUNTER — Other Ambulatory Visit: Payer: Self-pay

## 2019-02-25 ENCOUNTER — Ambulatory Visit (INDEPENDENT_AMBULATORY_CARE_PROVIDER_SITE_OTHER): Payer: Medicaid Other

## 2019-02-25 DIAGNOSIS — L209 Atopic dermatitis, unspecified: Secondary | ICD-10-CM

## 2019-02-25 NOTE — Unmapped (Signed)
Called, left voicemail

## 2019-02-25 NOTE — Unmapped (Signed)
Hasn't been seen in over a year. Will send to schedulers to get scheduled in first available adult clinic.

## 2019-02-25 NOTE — Unmapped (Signed)
Barry Bond. 's Dupixent shipment will be delayed as a result of no refills remain on the prescription.      I have reached out to the patient and left a voicemail message.  We will not reschedule the medication due to denied refill request and have removed this/these medication(s) from the work request.  We have not confirmed the new delivery date.

## 2019-02-25 NOTE — Unmapped (Signed)
[facility] Special[facility] Pharmacy Refill Coordination Note    Specialty Medication(s) to be Shipped:   Inflammatory Disorders: Dupixent    Other medication(s) to be shipped: na     Barry Bond., DOB: 2001-02-26  Phone: [phone] (home)       All above HIPAA information was verified with patient.     Completed refill call assessment today to schedule patient's medication shipment from the [facility] [phone]).       Specialty medication(s) and dose(s) confirmed: Regimen is correct and unchanged.   Changes to medications: Barry Bond reports no changes at this time.  Changes to insurance: No  Questions for the pharmacist: No    Confirmed patient received Welcome Packet with first shipment. The patient will receive a drug information handout for each medication shipped and additional FDA Medication Guides as required.       DISEASE/MEDICATION-SPECIFIC INFORMATION        For patients on injectable medications: Patient currently has 1 doses left.  Next injection is scheduled for [id number].    SPECIALTY MEDICATION ADHERENCE     Medication Adherence    Patient reported X missed doses in the last month: 0  Specialty Medication: dupixent 300 mg/2 ml  Patient is on additional specialty medications: No  Patient is on more than two specialty medications: No  Any gaps in refill history greater than 2 weeks in the last 3 months: no  Demonstrates understanding of importance of adherence: yes  Informant: patient  Reliability of informant: reliable  Support network for adherence: family member  Confirmed plan for next specialty medication refill: delivery by pharmacy  Refills needed for supportive medications: not needed                dupixent 300 mg/2 ml. 14 days on hand      SHIPPING     Shipping address confirmed in Epic.     Delivery Scheduled: Yes, Expected medication delivery date: 091020.     Medication will be delivered via UPS to the home address in Epic WAM.    Samary Shatz D Marigene Erler   [facility] Shared [facility] Specialty Technician

## 2019-02-25 NOTE — Unmapped (Signed)
Pt is requesting refill of DUPIXENT SYRINGE 300 mg/2 mL Syrg injection. Pt needs an appointment before request can be fulfilled. Pt's last visit was 12/02/2017.

## 2019-03-11 ENCOUNTER — Ambulatory Visit: Payer: Medicaid Other

## 2019-03-27 NOTE — Unmapped (Signed)
Harutyun's dad with accompany him for the 04/01/2019 dermatology visit.Mr. Steve answer no to all COVID screening questions.

## 2019-03-30 ENCOUNTER — Ambulatory Visit
Admit: 2019-03-30 | Discharge: 2019-03-31 | Payer: BLUE CROSS/BLUE SHIELD | Attending: Dermatology | Primary: Dermatology

## 2019-03-30 DIAGNOSIS — L209 Atopic dermatitis, unspecified: Secondary | ICD-10-CM

## 2019-03-30 MED ORDER — DUPILUMAB 300 MG/2 ML SUBCUTANEOUS SYRINGE
SUBCUTANEOUS | 11 refills | 0.00000 days | Status: CP
Start: 2019-03-30 — End: 2020-03-29
  Filled 2019-04-01: qty 4, 28d supply, fill #0

## 2019-03-30 MED ORDER — CEPHALEXIN 500 MG CAPSULE
ORAL_CAPSULE | Freq: Two times a day (BID) | ORAL | 0 refills | 14.00000 days | Status: CP
Start: 2019-03-30 — End: 2019-04-15
  Filled 2019-04-01: qty 28, 14d supply, fill #0

## 2019-03-30 MED ORDER — CLOBETASOL 0.05 % TOPICAL OINTMENT
Freq: Two times a day (BID) | TOPICAL | 11 refills | 0.00000 days | Status: CP
Start: 2019-03-30 — End: ?
  Filled 2019-04-01: qty 120, 30d supply, fill #0

## 2019-03-30 MED ORDER — TRIAMCINOLONE ACETONIDE 0.1 % TOPICAL OINTMENT
Freq: Two times a day (BID) | TOPICAL | 11 refills | 0.00000 days | Status: CP
Start: 2019-03-30 — End: 2020-03-29
  Filled 2019-04-01: qty 454, 30d supply, fill #0

## 2019-03-30 NOTE — Unmapped (Signed)
PEDIATRIC DERMATOLOGY CLINIC NOTE      A/P:  Atopic Dermatitis, severe, flaring BSA at least 80%  - Has failed numerous previous therapies  - Previously well controlled with dupixent but ran out 4 weeks ago and flaring.   - Continue dupilumab (DUPIXENT) 300 mg/2 mL Syrg injection; INJECT THE CONTENTS OF 1 SYRINGE (300MG ) UNDER THE SKIN EVERY 14 DAYS  - Continue emollients, prefer cerave and vaseline.  - Start triamcinolone 0.01% ointment BID to entire body. Apply wet wraps after application.   - Can re-start clobetasol ointment BID if TAC ointment not effective. Refills provided.   - Start cephalexin 500 mg BID for possible impetiginization.   - Has had an extensive work up for immune deficiency including immunoglobulins, lymphocyte markers, mitogens, vaccine response, genetic testing, and skin biopsy all pointing to normal eczema.      Return in about 6 months (around 09/28/2019) for f/u atopic dermatitis on dupixent. or sooner as needed      CC:  Chief Complaint   Patient presents with   ??? Skin Check     follow up atopic dermatitis- Dupixent helps to control outbreaks, needs refills.       HPI:  Barry Bond. is a 18 y.o. male last seen by Dr Elton Sin on 03/27/2019. He presents today for follow up of atopic dermatitis on dupilumab. Dad reports that patient is flaring due to running out of dupilumab 4 weeks ago. Otherwise, patient was well controlled on dupilumab 300 mg q2w, TSC (clobetasol). He moisturizes with cerave. Worse areas are at bilateral lower extremities, neck. Reports itching especially at legs, does not affect sleep. Tolerating dupixent without side effects.     No other new or changing lesions or areas of concern. Denies fevers, chills, night sweats.     Pertinent PMH: Reviewed in epic.     Patient Active Problem List   Diagnosis   ??? Atopic dermatitis   ??? Moderate persistent asthma without complication   ??? Osteopenia   - food allergy to peanut, fish, shellfish, and lentils      ROS: Baseline state of health. No fever, no other skin complaints except as noted in HPI.      PE:  General: Well-developed, well-nourished male, in no acute distress  Neuro: Alert and oriented, interacts appropriately  Mouth: no lesions of lips or oral mucosa  Ext: no inflammatory or dystrophic changes of nails  Skin: Examination with inspection and palpation of the head, neck, chest, abdomen, back, right upper extremity, left upper extremity, right lower extremity, left lower extremity, was performed and notable for the following:  - Diffuse xerosis and scale with hyperlinear palms, somewhat ill-defined erythematous plaques with evidence of lichenification and pigment alteration, with excoriation on the trunk and extremities.  - superficial erosion at R neck.     -All other areas examined were normal or had no significant findings.        The patient was seen and examined by Lorayne Bender, MD who agrees with the assessment and plan as above.

## 2019-03-31 NOTE — Unmapped (Addendum)
We appreciated the opportunity to see you in clinic today! Your resident doctor was Louanne Skye, MD and your attending doctor was Lorayne Bender, MD.    Re-start dupilumab 300 mg every 14 days    Start cephalexin 500 mg twice daily for possible infection. Please take with food.    Continue with clobetasol ointment and emollients (Cerave, vaseline). After applying, can place warm, wet towels on top and let sit.     If any of your medications are too expensive, you can look for a coupon at GoodRx.com  - Enter the medication name, size, and your zip code to find coupons for local pharmacies.   - You can print a coupon and bring it to the pharmacy, or pull up the coupon on a smartphone.  - You can also call pharmacies ahead of time to ask about the cost of your medication before you pick it up.    Please also feel free to call the clinic at [phone] with any concerns. We look forward to seeing you again!

## 2019-03-31 NOTE — Unmapped (Signed)
Barry Bond had a recent visit with dermatology and will start back on his Dupixent. He was off medication for ~ month due to needing a follow up appointment. He is currently having a flare and was prescribed a course of keflex. We'll plan to transfer and send this as well.     [facility] Clinical Assessment & Refill Coordination Note    Barry Bond., DOB: 01-06-2001  Phone: [phone] (home)     All above HIPAA information was verified with patient.     Specialty Medication(s):   Inflammatory Disorders: Dupixent     Current Outpatient Medications   Medication Sig Dispense Refill   ??? albuterol (PROVENTIL HFA;VENTOLIN HFA) 90 mcg/actuation inhaler Inhale 1 puff every six (6) hours as needed.      ??? albuterol (PROVENTIL HFA;VENTOLIN HFA) 90 mcg/actuation inhaler Inhale 2 puffs.     ??? albuterol 2.5 mg /3 mL (0.083 %) nebulizer solution INHALE THE CONTENTS OF 1 VIAL EVERY 6 HOURS AS NEEDED FOR COUGH OR WHEEZE     ??? ALBUTEROL INHL   prn as needed     ??? beclomethasone (QVAR) 80 mcg/actuation inhaler Inhale 2 puffs Two (2) times a day.      ??? calcium carbonate-vitamin D3 600 mg(1,500mg ) -800 unit Tab TAKE 1 TABLET BY MOUTH EVERY DAY 90 tablet 3   ??? cephalexin (KEFLEX) 500 MG capsule Take 1 capsule (500 mg total) by mouth Two (2) times a day for 14 days. 28 capsule 0   ??? cetirizine (ZYRTEC) 10 MG tablet Take 1 tablet (10 mg total) by mouth daily. 90 tablet 3   ??? clobetasoL (TEMOVATE) 0.05 % ointment Apply topically Two (2) times a day. 120 g 11   ??? dupilumab (DUPIXENT) 300 mg/2 mL Syrg injection INJECT THE CONTENTS OF 1 SYRINGE (300MG ) UNDER THE SKIN EVERY 14 DAYS 4 mL 11   ??? EPINEPHrine (EPIPEN) 0.3 mg/0.3 mL (1:1,000) injection      ??? EPINEPHrine (EPIPEN) 0.3 mg/0.3 mL injection Inject 0.3 mL (0.3 mg total) into the muscle once. for 1 dose In case of anaphylaxis 2 each 3   ??? fluticasone (FLONASE) 50 mcg/actuation nasal spray      ??? fluticasone-salmeterol (ADVAIR DISKUS) 250-50 mcg/dose diskus Inhale 1 puff daily. (Patient not taking: Reported on 03/27/2019) 60 each 0   ??? fluticasone-salmeterol (ADVAIR) 250-50 mcg/dose diskus Inhale 1 puff.     ??? predniSONE (DELTASONE) 10 MG tablet Take 1 tablet (10 mg total) by mouth daily. 30 tablet 0   ??? sulfamethoxazole-trimethoprim (BACTRIM DS) 800-160 mg per tablet Take one tablet by mouth daily 30 tablet 0   ??? tacrolimus (PROTOPIC) 0.03 % ointment Apply topically.     ??? triamcinolone (KENALOG) 0.1 % ointment Apply topically Two (2) times a day. To affected areas on the body. 453.6 g 11     No current facility-administered medications for this visit.         Changes to medications: [place] reports starting the following medications: keflex    Allergies   Allergen Reactions   ??? Benadryl [Diphenhydramine Hcl] Swelling   ??? Peanut Swelling and Other (See Comments)     Face   ??? Peanut Oil Anaphylaxis     Pt has an epi pen  Pt has an epi pen   ??? Soy Anaphylaxis   ??? Cetirizine Other (See Comments)     Other reaction(s): Unknown   ??? Cyproheptadine Other (See Comments)     Other Reaction: Other reaction   ???  Fish Containing Products Other (See Comments)     Unknown  Other reaction(s): UNKNOWN   ??? Hydroxyzine Other (See Comments)   ??? Shellfish Containing Products      Unknown   ??? Nickel Rash       Changes to allergies: No    SPECIALTY MEDICATION ADHERENCE     Dupixent - 0 left    Medication Adherence    Patient reported X missed doses in the last month: all  Specialty Medication: Dupixent  Support network for adherence: family member          Specialty medication(s) dose(s) confirmed: Regimen is correct and unchanged.     Are there any concerns with adherence? No    Adherence counseling provided? Not needed    CLINICAL MANAGEMENT AND INTERVENTION      Clinical Benefit Assessment:    Do you feel the medicine is effective or helping your condition? Yes    Clinical Benefit counseling provided? Not needed    Adverse Effects Assessment:    Are you experiencing any side effects? No    Are you experiencing difficulty administering your medicine? No    Quality of Life Assessment:    How many days over the past month did your atopic dermatitis  keep you from your normal activities? For example, brushing your teeth or getting up in the morning. 0    Have you discussed this with your provider? Not needed    Therapy Appropriateness:    Is therapy appropriate? Yes, therapy is appropriate and should be continued    DISEASE/MEDICATION-SPECIFIC INFORMATION      For patients on injectable medications: Patient currently has 0 doses left.  Next injection is scheduled for asap.    PATIENT SPECIFIC NEEDS     ? Does the patient have any physical, cognitive, or cultural barriers? No    ? Is the patient high risk? No     ? Does the patient require a Care Management Plan? No     ? Does the patient require physician intervention or other additional services (i.e. nutrition, smoking cessation, social work)? No      SHIPPING     Specialty Medication(s) to be Shipped:   Inflammatory Disorders: Dupixent    Other medication(s) to be shipped: keflex, triamcinolone, clobetasol     Changes to insurance: No    Delivery Scheduled: Yes, Expected medication delivery date: Thurs, Oct 8.     Medication will be delivered via UPS to the confirmed home address in [facility]    The patient will receive a drug information handout for each medication shipped and additional FDA Medication Guides as required.  Verified that patient has previously received a Conservation officer, historic buildings.    All of the patient's questions and concerns have been addressed.    Barry Bond   [facility] Specialty Pharmacist

## 2019-04-01 MED FILL — CLOBETASOL 0.05 % TOPICAL OINTMENT: 30 days supply | Qty: 120 | Fill #0 | Status: AC

## 2019-04-01 MED FILL — CEPHALEXIN 500 MG CAPSULE: 14 days supply | Qty: 28 | Fill #0 | Status: AC

## 2019-04-01 MED FILL — DUPIXENT 300 MG/2 ML SUBCUTANEOUS SYRINGE: 28 days supply | Qty: 4 | Fill #0 | Status: AC

## 2019-04-01 MED FILL — TRIAMCINOLONE ACETONIDE 0.1 % TOPICAL OINTMENT: 30 days supply | Qty: 454 | Fill #0 | Status: AC

## 2019-04-02 ENCOUNTER — Ambulatory Visit (INDEPENDENT_AMBULATORY_CARE_PROVIDER_SITE_OTHER): Payer: Medicaid Other

## 2019-04-02 DIAGNOSIS — L209 Atopic dermatitis, unspecified: Secondary | ICD-10-CM | POA: Diagnosis not present

## 2019-04-16 ENCOUNTER — Ambulatory Visit (INDEPENDENT_AMBULATORY_CARE_PROVIDER_SITE_OTHER): Payer: Medicaid Other

## 2019-04-16 ENCOUNTER — Encounter: Payer: Self-pay | Admitting: Family Medicine

## 2019-04-16 ENCOUNTER — Other Ambulatory Visit: Payer: Self-pay

## 2019-04-16 ENCOUNTER — Ambulatory Visit: Payer: Medicaid Other

## 2019-04-16 ENCOUNTER — Ambulatory Visit: Payer: Medicaid Other | Admitting: Family Medicine

## 2019-04-16 VITALS — BP 116/80 | HR 78 | Temp 98.2°F | Resp 20 | Ht 65.7 in | Wt 189.2 lb

## 2019-04-16 DIAGNOSIS — J4540 Moderate persistent asthma, uncomplicated: Secondary | ICD-10-CM | POA: Diagnosis not present

## 2019-04-16 DIAGNOSIS — J301 Allergic rhinitis due to pollen: Secondary | ICD-10-CM | POA: Diagnosis not present

## 2019-04-16 DIAGNOSIS — T7800XD Anaphylactic reaction due to unspecified food, subsequent encounter: Secondary | ICD-10-CM

## 2019-04-16 DIAGNOSIS — L209 Atopic dermatitis, unspecified: Secondary | ICD-10-CM

## 2019-04-16 DIAGNOSIS — L2084 Intrinsic (allergic) eczema: Secondary | ICD-10-CM

## 2019-04-16 MED ORDER — ALBUTEROL SULFATE HFA 108 (90 BASE) MCG/ACT IN AERS: 2.0000 | INHALATION_SPRAY | RESPIRATORY_TRACT | 1 refills | Status: DC | PRN

## 2019-04-16 NOTE — Progress Notes (Addendum)
[address] [place] [address] Dept: [phone]  FOLLOW UP NOTE  Patient ID: Travis Morrison, male    DOB: 2000/07/18  Age: 18 y.o. MRN: [id number] Date of Office Visit: 04/16/2019  Assessment  Chief Complaint: Cough (x 3 days)  HPI Travis Morrison is a 18 year old male who presents to the clinic for evaluation of a cough. He was last seen in this clinic on 10/27/2018 by Dr. Shaune Leeks for evaluation of asthma, allergic rhinitis, atopic dermatitis, and food allergy to peanut, fish, shellfish, and lentil.  At today's visit he reports that he began to experience a dry cough, increased throat clearing, and thick postnasal drainage that started on Monday.  He reports his asthma has been moderately well controlled with no shortness of breath, intermittent wheezing that improves with albuterol beginning on Monday, and cough that is producing some clear mucus that began on Monday.  He continues to use his Advair 250-1 puff twice a day and has used albuterol 2 times since Monday with relief of symptoms.  Allergic rhinitis is reported as not well controlled with nasal congestion, cough with clear mucus, and thick postnasal drainage.  He began using Flonase yesterday and is not taking an antihistamine at this time.  Atopic dermatitis is reported as well controlled with Dupixent every other week.  He is followed by dermatology specialty at [facility] for his eczema treatment plan.  He continues to avoid peanuts, tree nuts, fish, and lentils with no accidental ingestion or epinephrine use since his last visit to this clinic. His current medications are listed in the chart.   Drug Allergies:  Allergies  Allergen Reactions  . Cyproheptadine Other (See Comments)    Other Reaction: Other reaction Other Reaction: Other reaction   . Diphenhydramine Hcl Swelling  . Peanuts [Peanut Oil] Anaphylaxis    Pt has an epi pen  . Fish Allergy     Unknown  . Hydroxyzine Other (See Comments)  . Lentil    . Shellfish Allergy Cough  . Cephalexin Rash  . Nickel Other (See Comments) and Rash    Other Reaction: Not Assessed     Physical Exam: BP 116/80   Pulse 78   Temp 98.2 F (36.8 C) (Oral)   Resp 20   Ht 5' 5.7" (1.669 m)   Wt 189 lb 3.2 oz (85.8 kg)   SpO2 100%   BMI 30.82 kg/m    Physical Exam Vitals signs reviewed.  Constitutional:      Appearance: Normal appearance.  HENT:     Head: Normocephalic and atraumatic.     Right Ear: Tympanic membrane normal.     Left Ear: Tympanic membrane normal.     Nose:     Comments: Bilateral nares edematous and pale with clear nasal drainage noted.  Pharynx slightly erythematous with no exudate.  Ears normal.  Eyes normal. Eyes:     Conjunctiva/sclera: Conjunctivae normal.  Neck:     Musculoskeletal: Normal range of motion and neck supple.  Cardiovascular:     Rate and Rhythm: Normal rate and regular rhythm.     Heart sounds: Normal heart sounds. No murmur.  Pulmonary:     Effort: Pulmonary effort is normal.     Breath sounds: Normal breath sounds.     Comments: Lungs clear to auscultation Musculoskeletal: Normal range of motion.  Skin:    General: Skin is warm.     Comments: Scattered dry, erythematous, eczematous patches with no open areas or drainage  Neurological:     Mental Status: He is alert and oriented to person, place, and time.  Psychiatric:        Mood and Affect: Mood normal.        Behavior: Behavior normal.        Thought Content: Thought content normal.        Judgment: Judgment normal.    Diagnostics: FVC 3.81, FEV1 3.06.  Predicted FVC 5.02, predicted FEV1 4.31.  Spirometry indicates mild restriction.  Post bronchodilator therapy FVC 4.29, FEV1 3.18.  Postbronchodilator spirometry indicates normal ventilatory function with a 4% increase in FEV1 and 13% increase in FVC.  Assessment and Plan: 1. Atopic dermatitis, unspecified type   2. Seasonal allergic rhinitis due to pollen   3. Anaphylactic shock due  to food, subsequent encounter   4. Moderate persistent asthma without complication   5. Intrinsic atopic dermatitis     Meds ordered this encounter  Medications  . albuterol (VENTOLIN HFA) 108 (90 Base) MCG/ACT inhaler    Sig: Inhale 2 puffs into the lungs every 4 (four) hours as needed.    Dispense:  36 g    Refill:  1    One for home and school..    Patient Instructions  Asthma Continue Advair Diskus 250 mg-take 1 puff every 12 hours to prevent coughing or wheezing Ventolin 2 puffs every 4 hours if needed for wheezing or coughing spells or instead albuterol 0.083% 1 unit dose every 4 hours if needed Use Ventolin 2 puffs 5-15 minutes before exercise to decrease cough or wheeze  Allergic rhinitis Begin fluticasone 2 sprays per nostril once a day if needed for stuffy nose Consider saline nasal rinses as needed for nasal symptoms. Use this before any medicated nasal sprays for best result Begin Mucinex [phone] mg twice a day for and increase hydration as tolerated until your nasal symptoms are resolved After 5 days, begin cetirizine 10 mg-take 1 tablet once a day if needed for runny nose for itching If your symptome do not improve over the next 2 days, then begin prednisone 10 mg tablets. Take 2 tablets once a day for 4 days, then take 1 tablet on the last day, then stop.   Atopic dermatitis Continue on the treatment of his eczema prescribed by his dermatologist Continue dupilumab 300 mg once every 2 weeks  Food allergy Continue to avoid fish, shellfish, peanut and lentils.  If he has an allergic reaction give Benadryl 50 mg every 4 hours and if he has life-threatening symptoms inject with EpiPen 0.3 mg  Continue the other medications as listed in the chart  Call the clinic if this treatment plan is not working well for you  Follow up in 2 months or sooner if needed   Return in about 2 months (around 06/16/2019), or if symptoms worsen or fail to improve.    Thank you for  the opportunity to care for this patient.  Please do not hesitate to contact me with questions.  Thermon Leyland, FNP [facility]  ________________________________________________  I have provided oversight concerning [facility]'s evaluation and treatment of this patient's health issues addressed during today's encounter.  I agree with the assessment and therapeutic plan as outlined in the note.   Signed,   R Jorene Guest, MD

## 2019-04-16 NOTE — Patient Instructions (Addendum)
Asthma Continue Advair Diskus 250 mg-take 1 puff every 12 hours to prevent coughing or wheezing Ventolin 2 puffs every 4 hours if needed for wheezing or coughing spells or instead albuterol 0.083% 1 unit dose every 4 hours if needed Use Ventolin 2 puffs 5-15 minutes before exercise to decrease cough or wheeze  Allergic rhinitis Begin fluticasone 2 sprays per nostril once a day if needed for stuffy nose Consider saline nasal rinses as needed for nasal symptoms. Use this before any medicated nasal sprays for best result Begin Mucinex [phone] mg twice a day for and increase hydration as tolerated until your nasal symptoms are resolved After 5 days, begin cetirizine 10 mg-take 1 tablet once a day if needed for runny nose for itching If your symptome do not improve over the next 2 days, then begin prednisone 10 mg tablets. Take 2 tablets once a day for 4 days, then take 1 tablet on the last day, then stop.   Atopic dermatitis Continue on the treatment of his eczema prescribed by his dermatologist Continue dupilumab 300 mg once every 2 weeks  Food allergy Continue to avoid fish, shellfish, peanut and lentils.  If he has an allergic reaction give Benadryl 50 mg every 4 hours and if he has life-threatening symptoms inject with EpiPen 0.3 mg  Continue the other medications as listed in the chart  Call the clinic if this treatment plan is not working well for you  Follow up in 2 months or sooner if needed

## 2019-04-23 DIAGNOSIS — L209 Atopic dermatitis, unspecified: Principal | ICD-10-CM

## 2019-04-23 NOTE — Unmapped (Signed)
[facility] Pharmacy Refill Coordination Note    Specialty Medication(s) to be Shipped:   Inflammatory Disorders: Dupixent    Other medication(s) to be shipped: na     Barry Bond., DOB: 27-Jun-2000  Phone: [phone] (home)       All above HIPAA information was verified with patient's family member.     Completed refill call assessment today to schedule patient's medication shipment from the [facility] [phone]).       Specialty medication(s) and dose(s) confirmed: Regimen is correct and unchanged.   Changes to medications: Barry Bond reports no changes at this time.  Changes to insurance: No  Questions for the pharmacist: No    Confirmed patient received Welcome Packet with first shipment. The patient will receive a drug information handout for each medication shipped and additional FDA Medication Guides as required.       DISEASE/MEDICATION-SPECIFIC INFORMATION        For patients on injectable medications: Patient currently has 0 doses left.  Next injection is scheduled for 110520.    SPECIALTY MEDICATION ADHERENCE     Medication Adherence    Patient reported X missed doses in the last month: 0  Specialty Medication: dupixent 300 mg/2 ml  Patient is on additional specialty medications: No  Any gaps in refill history greater than 2 weeks in the last 3 months: no  Demonstrates understanding of importance of adherence: yes  Informant: father  Reliability of informant: reliable  Support network for adherence: family member  Confirmed plan for next specialty medication refill: delivery by pharmacy  Refills needed for supportive medications: not needed                dupixent 300 mg/2 ml. 0 on hand      SHIPPING     Shipping address confirmed in Epic.     Delivery Scheduled: Yes, Expected medication delivery date: 110420.     Medication will be delivered via UPS to the prescription address in Epic WAM.    Barry Bond Barry Bond [facility] Specialty Technician

## 2019-04-28 MED FILL — DUPIXENT 300 MG/2 ML SUBCUTANEOUS SYRINGE: 28 days supply | Qty: 4 | Fill #1 | Status: AC

## 2019-04-28 MED FILL — DUPIXENT 300 MG/2 ML SUBCUTANEOUS SYRINGE: 28 days supply | Qty: 4 | Fill #1

## 2019-04-30 ENCOUNTER — Other Ambulatory Visit: Payer: Self-pay

## 2019-04-30 ENCOUNTER — Ambulatory Visit (INDEPENDENT_AMBULATORY_CARE_PROVIDER_SITE_OTHER): Payer: Medicaid Other

## 2019-04-30 DIAGNOSIS — L209 Atopic dermatitis, unspecified: Secondary | ICD-10-CM | POA: Diagnosis not present

## 2019-05-04 ENCOUNTER — Ambulatory Visit: Payer: Medicaid Other | Admitting: Pediatrics

## 2019-05-14 ENCOUNTER — Ambulatory Visit: Payer: Medicaid Other

## 2019-05-14 NOTE — Unmapped (Signed)
[facility] Refill Coordination Note    Specialty Medication(s) to be Shipped:   Inflammatory Disorders: Dupixent    Other medication(s) to be shipped: n/a     Barry Bond., DOB: 04-Aug-2000  Phone: [phone] (home)       All above HIPAA information was verified with patient's family member.     Completed refill call assessment today to schedule patient's medication shipment from the [facility] [phone]).       Specialty medication(s) and dose(s) confirmed: Regimen is correct and unchanged.   Changes to medications: Hillard reports no changes at this time.  Changes to insurance: No  Questions for the pharmacist: No    Confirmed patient received Welcome Packet with first shipment. The patient will receive a drug information handout for each medication shipped and additional FDA Medication Guides as required.       DISEASE/MEDICATION-SPECIFIC INFORMATION        For patients on injectable medications: Patient currently has 1 doses left.  Next injection is scheduled for 05/15/2019.    SPECIALTY MEDICATION ADHERENCE     Medication Adherence    Patient reported X missed doses in the last month: 0  Specialty Medication: dupixent 300 mg/2 ml  Patient is on additional specialty medications: No  Any gaps in refill history greater than 2 weeks in the last 3 months: no  Demonstrates understanding of importance of adherence: yes  Informant: father  Reliability of informant: reliable  Support network for adherence: family member  Confirmed plan for next specialty medication refill: delivery by pharmacy  Refills needed for supportive medications: not needed                dupixent 300 mg/2 ml. 14 days on hand      SHIPPING     Shipping address confirmed in Epic.     Delivery Scheduled: Yes, Expected medication delivery date: 05/19/2019.     Medication will be delivered via UPS to the prescription address in Epic WAM.    Micaiah Litle D Terrence Pizana [facility] Specialty Technician

## 2019-05-15 ENCOUNTER — Ambulatory Visit (INDEPENDENT_AMBULATORY_CARE_PROVIDER_SITE_OTHER): Payer: Medicaid Other

## 2019-05-15 ENCOUNTER — Other Ambulatory Visit: Payer: Self-pay

## 2019-05-15 DIAGNOSIS — L209 Atopic dermatitis, unspecified: Secondary | ICD-10-CM

## 2019-05-18 MED FILL — DUPIXENT 300 MG/2 ML SUBCUTANEOUS SYRINGE: 28 days supply | Qty: 4 | Fill #2

## 2019-05-18 MED FILL — DUPIXENT 300 MG/2 ML SUBCUTANEOUS SYRINGE: 28 days supply | Qty: 4 | Fill #2 | Status: AC

## 2019-05-29 ENCOUNTER — Other Ambulatory Visit: Payer: Self-pay

## 2019-05-29 ENCOUNTER — Ambulatory Visit (INDEPENDENT_AMBULATORY_CARE_PROVIDER_SITE_OTHER): Payer: Medicaid Other

## 2019-05-29 DIAGNOSIS — L209 Atopic dermatitis, unspecified: Secondary | ICD-10-CM

## 2019-06-10 NOTE — Unmapped (Signed)
[facility] Refill Coordination Note    Specialty Medication(s) to be Shipped:   Inflammatory Disorders: Dupixent    Other medication(s) to be shipped: n/a     Barry Bond., DOB: 2000-12-17  Phone: [phone] (home)       All above HIPAA information was verified with patient's family member, dad.     Was a Nurse, learning disability used for this call? No    Completed refill call assessment today to schedule patient's medication shipment from the [facility] [phone]).       Specialty medication(s) and dose(s) confirmed: Regimen is correct and unchanged.   Changes to medications: Barry Bond reports no changes at this time.  Changes to insurance: No  Questions for the pharmacist: No    Confirmed patient received Welcome Packet with first shipment. The patient will receive a drug information handout for each medication shipped and additional FDA Medication Guides as required.       DISEASE/MEDICATION-SPECIFIC INFORMATION        For patients on injectable medications: Patient currently has 1 doses left.  Next injection is scheduled for 06/12/2019.    SPECIALTY MEDICATION ADHERENCE     Medication Adherence    Patient reported X missed doses in the last month: 0  Specialty Medication: dupixent 300 mg/2 ml  Patient is on additional specialty medications: No  Any gaps in refill history greater than 2 weeks in the last 3 months: no  Demonstrates understanding of importance of adherence: yes  Informant: father  Reliability of informant: reliable  Support network for adherence: family member  Confirmed plan for next specialty medication refill: delivery by pharmacy  Refills needed for supportive medications: not needed                dupixent 300 mg/2 ml. 14 days on [employer] address confirmed in Epic.     Delivery Scheduled: Yes, Expected medication delivery date: 06/16/2019.     Medication will be delivered via UPS to the prescription address in Epic WAM. Marquavius Scaife D Allante Beane   [facility] Shared [facility] Specialty Technician

## 2019-06-12 ENCOUNTER — Other Ambulatory Visit: Payer: Self-pay

## 2019-06-12 ENCOUNTER — Ambulatory Visit (INDEPENDENT_AMBULATORY_CARE_PROVIDER_SITE_OTHER): Payer: Medicaid Other

## 2019-06-12 DIAGNOSIS — L209 Atopic dermatitis, unspecified: Secondary | ICD-10-CM | POA: Diagnosis not present

## 2019-06-15 MED FILL — DUPIXENT 300 MG/2 ML SUBCUTANEOUS SYRINGE: 28 days supply | Qty: 4 | Fill #3 | Status: AC

## 2019-06-15 MED FILL — DUPIXENT 300 MG/2 ML SUBCUTANEOUS SYRINGE: 28 days supply | Qty: 4 | Fill #3

## 2019-06-22 ENCOUNTER — Emergency Department (HOSPITAL_COMMUNITY)
Admission: EM | Admit: 2019-06-22 | Discharge: 2019-06-22 | Disposition: A | Discharge status: Home / Self Care | Payer: Medicaid Other | Attending: Emergency Medicine | Admitting: Emergency Medicine

## 2019-06-22 ENCOUNTER — Other Ambulatory Visit: Payer: Self-pay

## 2019-06-22 ENCOUNTER — Encounter (HOSPITAL_COMMUNITY): Payer: Self-pay

## 2019-06-22 DIAGNOSIS — J45909 Unspecified asthma, uncomplicated: Secondary | ICD-10-CM | POA: Insufficient documentation

## 2019-06-22 DIAGNOSIS — K029 Dental caries, unspecified: Secondary | ICD-10-CM | POA: Insufficient documentation

## 2019-06-22 DIAGNOSIS — Z91010 Allergy to peanuts: Secondary | ICD-10-CM | POA: Insufficient documentation

## 2019-06-22 DIAGNOSIS — Z79899 Other long term (current) drug therapy: Secondary | ICD-10-CM | POA: Diagnosis not present

## 2019-06-22 DIAGNOSIS — K047 Periapical abscess without sinus: Secondary | ICD-10-CM | POA: Diagnosis not present

## 2019-06-22 DIAGNOSIS — K0889 Other specified disorders of teeth and supporting structures: Secondary | ICD-10-CM | POA: Diagnosis present

## 2019-06-22 MED ORDER — CHLORHEXIDINE GLUCONATE 0.12 % MT SOLN: 15.0000 mL | Freq: Two times a day (BID) | OROMUCOSAL | 0 refills | Status: DC

## 2019-06-22 MED ORDER — CLINDAMYCIN HCL 150 MG PO CAPS: 450.0000 mg | ORAL_CAPSULE | Freq: Four times a day (QID) | ORAL | 0 refills | Status: AC

## 2019-06-22 NOTE — ED Triage Notes (Signed)
Pt reports pain in all his wisdom teeth for the past year.

## 2019-06-22 NOTE — Discharge Instructions (Signed)
Please take antibiotics as prescribed and follow-up with the dentist that I have referred you to.  Please also use the chlorhexidine mouth rinse twice per day swish and spit.  And brush your  teeth twice per day as well.  Please call tomorrow to make an appointment with [facility]

## 2019-06-22 NOTE — ED Notes (Signed)
RN discussed discharge instructions with father via telephone. Father verbalized understanding with no questions at this time. Pt to go home with mother

## 2019-06-22 NOTE — ED Provider Notes (Signed)
[facility] EMERGENCY DEPARTMENT Provider Note   CSN: [id number] Arrival date & time: 06/22/19  1639     History Chief Complaint  Patient presents with  . Dental Pain    Travis Morrison is a 18 y.o. male.  HPI  Patient is 18 year old male presenting today with right lower molar dental pain.  He has abdominal pain for approximately 1 year but starting today he has had severe right lower dental pain that is worse with chewing.  Patient states the pain is severe, nonradiating, worse with touching his tooth as well.  Patient denies any medication use to help with pain.      Past Medical History:  Diagnosis Date  . Asthma   . Eczema   . Food allergy   . Multiple food allergies     Patient Active Problem List   Diagnosis Date Noted  . Moderate persistent asthma without complication 08/65/7846  . Intrinsic atopic dermatitis 09/05/2015  . Seasonal allergic rhinitis due to pollen 09/05/2015  . Anaphylactic shock due to adverse food reaction 09/05/2015    Past Surgical History:  Procedure Laterality Date  . no past surgery         Family History  Problem Relation Age of Onset  . Allergic rhinitis Mother     Social History   Tobacco Use  . Smoking status: Never Smoker  . Smokeless tobacco: Never Used  Substance Use Topics  . Alcohol use: No  . Drug use: No    Home Medications Prior to Admission medications   Medication Sig Start Date End Date Taking? Authorizing Provider  albuterol (PROVENTIL) (2.5 MG/3ML) 0.083% nebulizer solution 1 vial in nebulizer every 4 hours as needed for coughing or wheezing. 10/27/18   Dara Hoyer, FNP  albuterol (VENTOLIN HFA) 108 (90 Base) MCG/ACT inhaler Inhale 2 puffs into the lungs every 4 (four) hours as needed. 04/16/19   Dara Hoyer, FNP  Calcium Carb-Cholecalciferol 600-800 MG-UNIT TABS Take one tablet daily 10/28/17   [provider]  cetirizine (ZYRTEC) 10 MG tablet 1 tablet once a day if needed for  runny nose or itchy eyes 10/27/18   Ambs, Kathrine Cords, FNP  chlorhexidine (PERIDEX) 0.12 % solution Use as directed 15 mLs in the mouth or throat 2 (two) times daily. 06/22/19   Tedd Sias, PA  clindamycin (CLEOCIN) 150 MG capsule Take 3 capsules (450 mg total) by mouth every 6 (six) hours for 7 days. 06/22/19 06/29/19  Tedd Sias, PA  clobetasol ointment (TEMOVATE) 0.05 % APPLY TOPICALLY FOR ECZEMA TWICE DAILY UNTIL SMOOTH. 04/17/17   [provider]  Dupilumab 300 MG/2ML SOSY Inject 300 mg into the skin. 01/08/17   [provider]  EPINEPHrine (EPIPEN 2-PAK) 0.3 mg/0.3 mL IJ SOAJ injection Use as directed for severe allergic reactions 10/27/18   Ambs, Kathrine Cords, FNP  fluticasone (FLONASE) 50 MCG/ACT nasal spray 1 spray per nostril once a day if needed for stuffy nose. 10/27/18   Dara Hoyer, FNP  Fluticasone-Salmeterol (ADVAIR DISKUS) 250-50 MCG/DOSE AEPB One puff twice a day to prevent coughing or wheezing. Rinse, gargle and spit out after use 10/27/18   Ambs, Kathrine Cords, FNP  triamcinolone ointment (KENALOG) 0.1 % Apply topically. 03/30/19 03/29/20  [provider]    Allergies    Cyproheptadine, Diphenhydramine hcl, Peanuts [peanut oil], Fish allergy, Hydroxyzine, Lentil, Shellfish allergy, Cephalexin, and Nickel  Review of Systems   Review of Systems  Constitutional: Negative for chills  and fever.  HENT: Positive for dental problem. Negative for congestion.   Respiratory: Negative for shortness of breath.   Cardiovascular: Negative for chest pain.  Gastrointestinal: Negative for abdominal pain.  Musculoskeletal: Negative for neck pain.    Physical Exam Updated Vital Signs BP 124/79   Pulse 93   Temp 98.4 F (36.9 C) (Oral)   Resp 16   SpO2 98%   Physical Exam Vitals and nursing note reviewed.  Constitutional:      General: He is not in acute distress.    Appearance: Normal appearance. He is not ill-appearing.  HENT:     Head: Normocephalic and atraumatic.       Nose: Nose normal.     Mouth/Throat:     Mouth: Mucous membranes are moist.      Comments: No obvious periapical abscess.  No purulence.  No swelling of tongue or buccal mucosa.  Able to move tongue in all directions.  No sublingual pain to palpa Eyes:     General: No scleral icterus.       Right eye: No discharge.        Left eye: No discharge.     Conjunctiva/sclera: Conjunctivae normal.  Pulmonary:     Effort: Pulmonary effort is normal.     Breath sounds: No stridor.  Neurological:     Mental Status: He is alert and oriented to person, place, and time. Mental status is at baseline.     ED Results / Procedures / Treatments   Labs (all labs ordered are listed, but only abnormal results are displayed) Labs Reviewed - No data to display  EKG None  Radiology No results found.  Procedures Procedures (including critical care time)  Medications Ordered in ED Medications - No data to display  ED Course  I have reviewed the triage vital signs and the nursing notes.  Pertinent labs & imaging results that were available during my care of the patient were reviewed by me and considered in my medical decision making (see chart for details).    MDM Rules/Calculators/A&P                      Patient is 18 year old male with no significant past medical history.  He does seem to have a developmental delay and I called his father is demographic information is available in the chart who confirmed the patient has elemental delay.  He appears to have a dental infection of her right lower gumline/molar.  Will prescribe patient Augmentin and have him follow-up with Dr. Mia Creek who is our on-call general dentist.  Patient is understanding of plan and I discussed plan with father who states he will call patient tomorrow and make sure he follows up.  Patient given Augmentin.  He does not appear to be in any significant pain at this moment will defer narcotic pain medication and recommend  timing ibuprofen for pain.  Patient not appear to have Ludwig's angina or retropharyngeal or peritonsillar abscess.  Doubt deep space infection.  Is well-appearing on exam is afebrile with no tachycardia or tachypnea.  Given return precautions.    This patient appears reasonably screened and I doubt any other medical condition requiring further workup, evaluation, or treatment in the ED at this time prior to discharge.   Patient's vitals are WNL apart from vital sign abnormalities discussed above, patient is in NAD, and able to ambulate in the ED at their baseline. Pain has been managed or a plan has  been made for home management and has no complaints prior to discharge. Patient is comfortable with above plan and is stable for discharge at this time. All questions were answered prior to disposition. Results from the ER workup discussed with the patient face to face and all questions answered to the best of my ability. The patient is safe for discharge with strict return precautions. Patient appears safe for discharge with appropriate follow-up. Conveyed my impression with the patient and they voiced understanding and are agreeable to plan.   An After Visit Summary was printed and given to the patient.  Portions of this note were generated with Scientist, clinical (histocompatibility and immunogenetics)Dragon dictation software. Dictation errors may occur despite best attempts at proofreading.     Final Clinical Impression(s) / ED Diagnoses Final diagnoses:  Dental caries  Dental infection    Rx / DC Orders ED Discharge Orders         Ordered    clindamycin (CLEOCIN) 150 MG capsule  Every 6 hours     06/22/19 2226    chlorhexidine (PERIDEX) 0.12 % solution  2 times daily     06/22/19 2226           [name] ShelterFondaw, Tiaira Arambula S, GeorgiaPA 06/22/19 2231    Virgina NorfolkCuratolo, Adam, DO 06/22/19 2351

## 2019-06-25 ENCOUNTER — Ambulatory Visit (INDEPENDENT_AMBULATORY_CARE_PROVIDER_SITE_OTHER): Payer: Medicaid Other

## 2019-06-25 ENCOUNTER — Other Ambulatory Visit: Payer: Self-pay

## 2019-06-25 DIAGNOSIS — L209 Atopic dermatitis, unspecified: Secondary | ICD-10-CM

## 2019-07-08 NOTE — Unmapped (Signed)
[facility] Specialty Pharmacy Refill Coordination Note    Specialty Medication(s) to be Shipped:   Inflammatory Disorders: Dupixent    Other medication(s) to be shipped: n/a     Barry Bond., DOB: [birth year]  Phone: [phone] (home)       All above HIPAA information was verified with patient's family member, dad.     Was a Nurse, learning disability used for this call? No    Completed refill call assessment today to schedule patient's medication shipment from the [facility] [phone]).       Specialty medication(s) and dose(s) confirmed: Regimen is correct and unchanged.   Changes to medications: [name] reports no changes at this time.  Changes to insurance: No  Questions for the pharmacist: No    Confirmed patient received Welcome Packet with first shipment. The patient will receive a drug information handout for each medication shipped and additional FDA Medication Guides as required.       DISEASE/MEDICATION-SPECIFIC INFORMATION        For patients on injectable medications: Patient currently has 1 doses left.  Next injection is scheduled for 07/10/2019.    SPECIALTY MEDICATION ADHERENCE     Medication Adherence    Patient reported X missed doses in the last month: 0  Specialty Medication: Dupixent 300 mg/2 ml  Patient is on additional specialty medications: No  Any gaps in refill history greater than 2 weeks in the last 3 months: no  Demonstrates understanding of importance of adherence: yes  Informant: father  Reliability of informant: reliable  Support network for adherence: family member  Confirmed plan for next specialty medication refill: delivery by pharmacy  Refills needed for supportive medications: not needed                Dupixent 300 mg/2 ml. 14 days on hand      SHIPPING     Shipping address confirmed in Epic.     Delivery Scheduled: Yes, Expected medication delivery date: 07/16/2019.     Medication will be delivered via UPS to the prescription address in Epic WAM. Barry Bond   [facility] Shared [facility] Specialty Technician

## 2019-07-09 ENCOUNTER — Other Ambulatory Visit: Payer: Self-pay

## 2019-07-09 ENCOUNTER — Ambulatory Visit (INDEPENDENT_AMBULATORY_CARE_PROVIDER_SITE_OTHER): Payer: Medicaid Other

## 2019-07-09 DIAGNOSIS — L209 Atopic dermatitis, unspecified: Secondary | ICD-10-CM

## 2019-07-15 MED FILL — DUPIXENT 300 MG/2 ML SUBCUTANEOUS SYRINGE: 28 days supply | Qty: 4 | Fill #4

## 2019-07-15 MED FILL — DUPIXENT 300 MG/2 ML SUBCUTANEOUS SYRINGE: 28 days supply | Qty: 4 | Fill #4 | Status: AC

## 2019-07-23 ENCOUNTER — Ambulatory Visit (INDEPENDENT_AMBULATORY_CARE_PROVIDER_SITE_OTHER): Payer: Medicaid Other

## 2019-07-23 ENCOUNTER — Other Ambulatory Visit: Payer: Self-pay

## 2019-07-23 DIAGNOSIS — L209 Atopic dermatitis, unspecified: Secondary | ICD-10-CM

## 2019-08-06 ENCOUNTER — Ambulatory Visit (INDEPENDENT_AMBULATORY_CARE_PROVIDER_SITE_OTHER): Payer: Medicaid Other

## 2019-08-06 ENCOUNTER — Other Ambulatory Visit: Payer: Self-pay

## 2019-08-06 DIAGNOSIS — L209 Atopic dermatitis, unspecified: Secondary | ICD-10-CM

## 2019-08-06 NOTE — Unmapped (Signed)
[facility] Specialty Pharmacy Refill Coordination Note    Specialty Medication(s) to be Shipped:   Inflammatory Disorders: Dupixent    Other medication(s) to be shipped: n/a     Barry Bond., DOB: [birth year]  Phone: [phone] (home)       All above HIPAA information was verified with patient's family member, father.     Was a Nurse, learning disability used for this call? No    Completed refill call assessment today to schedule patient's medication shipment from the [facility] [phone]).       Specialty medication(s) and dose(s) confirmed: Regimen is correct and unchanged.   Changes to medications: [name] reports no changes at this time.  Changes to insurance: No  Questions for the pharmacist: No    Confirmed patient received Welcome Packet with first shipment. The patient will receive a drug information handout for each medication shipped and additional FDA Medication Guides as required.       DISEASE/MEDICATION-SPECIFIC INFORMATION        For patients on injectable medications: Patient currently has 1 doses left.  Next injection is scheduled for 08/06/2019.    SPECIALTY MEDICATION ADHERENCE     Medication Adherence    Patient reported X missed doses in the last month: 0  Specialty Medication: Dupixent 300 mg/2 ml  Patient is on additional specialty medications: No  Any gaps in refill history greater than 2 weeks in the last 3 months: no  Demonstrates understanding of importance of adherence: yes  Informant: father  Reliability of informant: reliable  Support network for adherence: family member  Confirmed plan for next specialty medication refill: delivery by pharmacy  Refills needed for supportive medications: not needed                Dupixent 300 mg/2 ml. 14 days on hand      SHIPPING     Shipping address confirmed in Epic.     Delivery Scheduled: Yes, Expected medication delivery date: 08/13/2019.     Medication will be delivered via UPS to the prescription address in Epic WAM.    Barry Bond D Barry Bond   [facility] Shared [facility] Specialty Technician

## 2019-08-12 MED FILL — DUPIXENT 300 MG/2 ML SUBCUTANEOUS SYRINGE: 28 days supply | Qty: 4 | Fill #5

## 2019-08-12 MED FILL — DUPIXENT 300 MG/2 ML SUBCUTANEOUS SYRINGE: 28 days supply | Qty: 4 | Fill #5 | Status: AC

## 2019-08-20 ENCOUNTER — Other Ambulatory Visit: Payer: Self-pay

## 2019-08-20 ENCOUNTER — Ambulatory Visit (INDEPENDENT_AMBULATORY_CARE_PROVIDER_SITE_OTHER): Payer: Medicaid Other | Admitting: *Deleted

## 2019-08-20 DIAGNOSIS — L209 Atopic dermatitis, unspecified: Secondary | ICD-10-CM | POA: Diagnosis not present

## 2019-09-03 ENCOUNTER — Ambulatory Visit (INDEPENDENT_AMBULATORY_CARE_PROVIDER_SITE_OTHER): Payer: Medicaid Other | Admitting: *Deleted

## 2019-09-03 ENCOUNTER — Other Ambulatory Visit: Payer: Self-pay

## 2019-09-03 DIAGNOSIS — L209 Atopic dermatitis, unspecified: Secondary | ICD-10-CM

## 2019-09-04 ENCOUNTER — Other Ambulatory Visit: Payer: Self-pay | Admitting: *Deleted

## 2019-09-04 MED ORDER — FLUTICASONE-SALMETEROL 250-50 MCG/DOSE IN AEPB: INHALATION_SPRAY | RESPIRATORY_TRACT | 5 refills | Status: DC

## 2019-09-04 NOTE — Unmapped (Signed)
Tan's father reports he's doing well on his Dupixent. He occasionally has some small flares, but uses topicals with good effect. He did request a refill of Advair from outside provider.    [facility] Shared [facility] Clinical Assessment & Refill Coordination Note    Barry Bond., DOB: [birth year]  Phone: [phone] (home)     All above HIPAA information was verified with patient's family member, father, Barry Bond.     Was a Nurse, learning disability used for this call? No    Specialty Medication(s):   Inflammatory Disorders: Dupixent     Current Outpatient Medications   Medication Sig Dispense Refill   ??? albuterol (PROVENTIL HFA;VENTOLIN HFA) 90 mcg/actuation inhaler Inhale 1 puff every six (6) hours as needed.      ??? albuterol (PROVENTIL HFA;VENTOLIN HFA) 90 mcg/actuation inhaler Inhale 2 puffs.     ??? albuterol 2.5 mg /3 mL (0.083 %) nebulizer solution INHALE THE CONTENTS OF 1 VIAL EVERY 6 HOURS AS NEEDED FOR COUGH OR WHEEZE     ??? ALBUTEROL INHL   prn as needed     ??? beclomethasone (QVAR) 80 mcg/actuation inhaler Inhale 2 puffs Two (2) times a day.      ??? calcium carbonate-vitamin D3 600 mg(1,500mg ) -800 unit Tab TAKE 1 TABLET BY MOUTH EVERY DAY 90 tablet 3   ??? cetirizine (ZYRTEC) 10 MG tablet Take 1 tablet (10 mg total) by mouth daily. 90 tablet 3   ??? clobetasoL (TEMOVATE) 0.05 % ointment Apply topically Two (2) times a day. 120 g 11   ??? dupilumab (DUPIXENT) 300 mg/2 mL Syrg injection INJECT THE CONTENTS OF 1 SYRINGE (300MG ) UNDER THE SKIN EVERY 14 DAYS 4 mL 11   ??? EPINEPHrine (EPIPEN) 0.3 mg/0.3 mL (1:1,000) injection      ??? EPINEPHrine (EPIPEN) 0.3 mg/0.3 mL injection Inject 0.3 mL (0.3 mg total) into the muscle once. for 1 dose In case of anaphylaxis 2 each 3   ??? fluticasone (FLONASE) 50 mcg/actuation nasal spray      ??? fluticasone-salmeterol (ADVAIR DISKUS) 250-50 mcg/dose diskus Inhale 1 puff daily. (Patient not taking: Reported on 03/27/2019) 60 each 0   ??? fluticasone-salmeterol (ADVAIR) 250-50 mcg/dose diskus Inhale 1 puff.     ??? predniSONE (DELTASONE) 10 MG tablet Take 1 tablet (10 mg total) by mouth daily. 30 tablet 0   ??? sulfamethoxazole-trimethoprim (BACTRIM DS) 800-160 mg per tablet Take one tablet by mouth daily 30 tablet 0   ??? tacrolimus (PROTOPIC) 0.03 % ointment Apply topically.     ??? triamcinolone (KENALOG) 0.1 % ointment Apply topically Two (2) times a day. To affected areas on the body. 453.6 g 11     No current facility-administered medications for this visit.         Changes to medications: Alfonsa reports no changes at this time.    Allergies   Allergen Reactions   ??? Benadryl [Diphenhydramine Hcl] Swelling   ??? Peanut Swelling and Other (See Comments)     Face   ??? Peanut Oil Anaphylaxis     Pt has an epi pen  Pt has an epi pen   ??? Soy Anaphylaxis   ??? Cetirizine Other (See Comments)     Other reaction(s): Unknown   ??? Cyproheptadine Other (See Comments)     Other Reaction: Other reaction   ??? Fish Containing Products Other (See Comments)     Unknown  Other reaction(s): UNKNOWN   ??? Hydroxyzine Other (See Comments)   ??? Shellfish Containing Products  Unknown   ??? Nickel Rash       Changes to allergies: No    SPECIALTY MEDICATION ADHERENCE     Dupixent - 0 left    Medication Adherence    Patient reported X missed doses in the last month: 0  Specialty Medication: Dupixent  Patient is on additional specialty medications: No  Support network for adherence: family member          Specialty medication(s) dose(s) confirmed: Regimen is correct and unchanged.     Are there any concerns with adherence? No    Adherence counseling provided? Not needed    CLINICAL MANAGEMENT AND INTERVENTION      Clinical Benefit Assessment:    Do you feel the medicine is effective or helping your condition? Yes    Clinical Benefit counseling provided? Not needed    Adverse Effects Assessment:    Are you experiencing any side effects? No    Are you experiencing difficulty administering your medicine? No    Quality of Life Assessment:    How many days over the past month did your AD  keep you from your normal activities? For example, brushing your teeth or getting up in the morning. 0    Have you discussed this with your provider? Not needed    Therapy Appropriateness:    Is therapy appropriate? Yes, therapy is appropriate and should be continued    DISEASE/MEDICATION-SPECIFIC INFORMATION      For patients on injectable medications: Patient currently has 0 doses left.  Next injection is scheduled for Thurs, March 25.    PATIENT SPECIFIC NEEDS     ? Does the patient have any physical, cognitive, or cultural barriers? No    ? Is the patient high risk? No     ? Does the patient require a Care Management Plan? No     ? Does the patient require physician intervention or other additional services (i.e. nutrition, smoking cessation, social work)? No      SHIPPING     Specialty Medication(s) to be Shipped:   Inflammatory Disorders: Dupixent    Other medication(s) to be shipped: clobetasol, triamcinolone, Advair     Changes to insurance: No    Delivery Scheduled: Yes, Expected medication delivery date: Thurs, March 18.     Medication will be delivered via UPS to the confirmed prescription address in [facility].    The patient will receive a drug information handout for each medication shipped and additional FDA Medication Guides as required.  Verified that patient has previously received a Conservation officer, historic buildings.    All of the patient's questions and concerns have been addressed.    Barry Bond   [facility] Shared [facility] Specialty Pharmacist

## 2019-09-09 MED FILL — TRIAMCINOLONE ACETONIDE 0.1 % TOPICAL OINTMENT: 30 days supply | Qty: 454 | Fill #1 | Status: AC

## 2019-09-09 MED FILL — CLOBETASOL 0.05 % TOPICAL OINTMENT: TOPICAL | 30 days supply | Qty: 120 | Fill #1

## 2019-09-09 MED FILL — DUPIXENT 300 MG/2 ML SUBCUTANEOUS SYRINGE: 28 days supply | Qty: 4 | Fill #6

## 2019-09-09 MED FILL — CLOBETASOL 0.05 % TOPICAL OINTMENT: 30 days supply | Qty: 120 | Fill #1 | Status: AC

## 2019-09-09 MED FILL — TRIAMCINOLONE ACETONIDE 0.1 % TOPICAL OINTMENT: TOPICAL | 30 days supply | Qty: 454 | Fill #1

## 2019-09-09 MED FILL — DUPIXENT 300 MG/2 ML SUBCUTANEOUS SYRINGE: 28 days supply | Qty: 4 | Fill #6 | Status: AC

## 2019-09-10 ENCOUNTER — Other Ambulatory Visit: Payer: Self-pay

## 2019-09-10 MED ORDER — FLUTICASONE 250 MCG-SALMETEROL 50 MCG/DOSE BLISTR POWDR FOR INHALATION
Freq: Two times a day (BID) | RESPIRATORY_TRACT | 0 refills | 30.00000 days
Start: 2019-09-10 — End: ?

## 2019-09-10 MED ORDER — FLUTICASONE-SALMETEROL 250-50 MCG/DOSE IN AEPB: INHALATION_SPRAY | RESPIRATORY_TRACT | 0 refills | Status: DC

## 2019-09-10 NOTE — Telephone Encounter (Signed)
Refill for Advair x 1 with no refills. Patient will need a 6 month follow-up soon. Dad informed, appointment made.

## 2019-09-14 MED FILL — ADVAIR DISKUS 250 MCG-50 MCG/DOSE POWDER FOR INHALATION: 30 days supply | Qty: 60 | Fill #0 | Status: AC

## 2019-09-14 MED FILL — ADVAIR DISKUS 250 MCG-50 MCG/DOSE POWDER FOR INHALATION: RESPIRATORY_TRACT | 30 days supply | Qty: 60 | Fill #0

## 2019-09-17 ENCOUNTER — Ambulatory Visit (INDEPENDENT_AMBULATORY_CARE_PROVIDER_SITE_OTHER): Payer: Medicaid Other | Admitting: *Deleted

## 2019-09-17 ENCOUNTER — Other Ambulatory Visit: Payer: Self-pay

## 2019-09-17 DIAGNOSIS — L209 Atopic dermatitis, unspecified: Secondary | ICD-10-CM

## 2019-09-30 NOTE — Unmapped (Signed)
[facility] Specialty Pharmacy Refill Coordination Note    Specialty Medication(s) to be Shipped:   Inflammatory Disorders: Dupixent    Other medication(s) to be shipped: Clobetasol and Triamcinolone ointment     Barry Bond., DOB: 11-02-2000  Phone: [phone] (home)       All above HIPAA information was verified with patient's family member, father.     Was a Nurse, learning disability used for this call? No    Completed refill call assessment today to schedule patient's medication shipment from the [facility] [phone]).       Specialty medication(s) and dose(s) confirmed: Regimen is correct and unchanged.   Changes to medications: Barry Bond reports no changes at this time.  Changes to insurance: No  Questions for the pharmacist: No    Confirmed patient received Welcome Packet with first shipment. The patient will receive a drug information handout for each medication shipped and additional FDA Medication Guides as required.       DISEASE/MEDICATION-SPECIFIC INFORMATION        For patients on injectable medications: Patient currently has 1 doses left.  Next injection is scheduled for 10/01/2019.    SPECIALTY MEDICATION ADHERENCE     Medication Adherence    Patient reported X missed doses in the last month: 0  Specialty Medication: Dupixent 300 mg/2 ml  Patient is on additional specialty medications: No  Any gaps in refill history greater than 2 weeks in the last 3 months: no  Demonstrates understanding of importance of adherence: yes  Informant: father  Reliability of informant: reliable  Support network for adherence: family member  Confirmed plan for next specialty medication refill: delivery by pharmacy  Refills needed for supportive medications: not needed                Dupixent 300 mg/2 ml. 14 days on hand      SHIPPING     Shipping address confirmed in Epic.     Delivery Scheduled: Yes, Expected medication delivery date: 10/08/2019.     Medication will be delivered via UPS to the prescription address in Epic WAM.    Barry Bond Barry Bond   [facility] Shared [facility] Specialty Technician

## 2019-10-01 ENCOUNTER — Ambulatory Visit: Payer: Medicaid Other | Admitting: Family Medicine

## 2019-10-01 ENCOUNTER — Encounter: Payer: Self-pay | Admitting: Family Medicine

## 2019-10-01 ENCOUNTER — Ambulatory Visit (INDEPENDENT_AMBULATORY_CARE_PROVIDER_SITE_OTHER): Payer: Medicaid Other

## 2019-10-01 ENCOUNTER — Other Ambulatory Visit: Payer: Self-pay

## 2019-10-01 VITALS — BP 124/74 | HR 74 | Temp 98.5°F | Resp 16 | Ht 66.0 in | Wt 194.7 lb

## 2019-10-01 DIAGNOSIS — L209 Atopic dermatitis, unspecified: Secondary | ICD-10-CM | POA: Diagnosis not present

## 2019-10-01 DIAGNOSIS — J4540 Moderate persistent asthma, uncomplicated: Secondary | ICD-10-CM | POA: Diagnosis not present

## 2019-10-01 DIAGNOSIS — J301 Allergic rhinitis due to pollen: Secondary | ICD-10-CM | POA: Diagnosis not present

## 2019-10-01 DIAGNOSIS — L2084 Intrinsic (allergic) eczema: Secondary | ICD-10-CM

## 2019-10-01 DIAGNOSIS — T7800XD Anaphylactic reaction due to unspecified food, subsequent encounter: Secondary | ICD-10-CM

## 2019-10-01 MED ORDER — ALBUTEROL SULFATE HFA 108 (90 BASE) MCG/ACT IN AERS: 2.0000 | INHALATION_SPRAY | RESPIRATORY_TRACT | 1 refills | Status: DC | PRN

## 2019-10-01 MED ORDER — TRIAMCINOLONE ACETONIDE 0.1 % EX OINT: TOPICAL_OINTMENT | CUTANEOUS | 3 refills | Status: DC

## 2019-10-01 MED ORDER — EPINEPHRINE 0.3 MG/0.3ML IJ SOAJ: INTRAMUSCULAR | 1 refills | Status: DC

## 2019-10-01 MED ORDER — FLUTICASONE PROPIONATE 50 MCG/ACT NA SUSP: NASAL | 5 refills | Status: DC

## 2019-10-01 MED ORDER — FLUTICASONE-SALMETEROL 250-50 MCG/DOSE IN AEPB: INHALATION_SPRAY | RESPIRATORY_TRACT | 5 refills | Status: DC

## 2019-10-01 MED ORDER — ALBUTEROL SULFATE (2.5 MG/3ML) 0.083% IN NEBU: INHALATION_SOLUTION | RESPIRATORY_TRACT | 1 refills | Status: DC

## 2019-10-01 NOTE — Progress Notes (Addendum)
[address] [place]  [address] Dept: [phone]  FOLLOW UP NOTE  Patient ID: Travis Morrison, male    DOB: 05-04-2001  Age: 19 y.o. MRN: [id number] Date of Office Visit: 10/01/2019  Assessment  Chief Complaint: Allergic Rhinitis , Asthma, and Eczema  HPI Travis Morrison is a 19 year old male who presents to the clinic for follow-up visit. He was last seen in this clinic on 04/16/2019 for evaluation of asthma, allergic rhinitis, atopic dermatitis, and food allergy to peanut, fish, shellfish, and lentil.  At today's visit, his father reports that his asthma has been moderately well controlled with wheezing that began about 2 months ago occurring mostly in the morning and has gotten better over the last couple of weeks.  He continues Advair discus 250-1 puff in the morning and 1 puff in the evening and uses albuterol about 1-2 times a week with relief of symptoms.  He reports occasional heartburn for which he takes an over-the-counter tablet about once or twice a month with relief of symptoms.  Allergic rhinitis is reported as moderately well controlled with nasal congestion, occasional sneeze, and occasional postnasal drainage for which he is not currently using any medical intervention.  Atopic dermatitis is reported as well controlled with intermittent red itchy areas occurring on the bilateral antecubital, bilateral popliteal fossa, and posterior neck for which he continues Dupixent 300 mg once every 2 weeks and occasional use of triamcinolone ointment.  He reports a significant decrease in his symptoms of atopic dermatitis while continuing on Dupixent injections.  He continues to avoid peanut, fish, shellfish, and lentil with no accidental ingestions or EpiPen use since his last visit to this clinic.  His current medications are listed in the chart.   Drug Allergies:  Allergies  Allergen Reactions  . Cyproheptadine Other (See Comments)    Other Reaction: Other reaction Other  Reaction: Other reaction   . Diphenhydramine Hcl Swelling  . Peanuts [Peanut Oil] Anaphylaxis    Pt has an epi pen  . Fish Allergy     Unknown  . Hydroxyzine Other (See Comments)  . Lentil   . Shellfish Allergy Cough  . Cephalexin Rash  . Nickel Other (See Comments) and Rash    Other Reaction: Not Assessed     Physical Exam: BP 124/74 (BP Location: Right Arm, Patient Position: Sitting, Cuff Size: Normal)   Pulse 74   Temp 98.5 F (36.9 C) (Oral)   Resp 16   Ht 5\' 6"  (1.676 m)   Wt 194 lb 10.7 oz (88.3 kg)   SpO2 97%   BMI 31.42 kg/m    Physical Exam Vitals reviewed.  Constitutional:      Appearance: Normal appearance.  HENT:     Head: Normocephalic and atraumatic.     Right Ear: Tympanic membrane normal.     Left Ear: Tympanic membrane normal.     Nose:     Comments: Bilateral nares slightly erythematous and edematous with clear nasal drainage noted.  Pharynx normal.  Ears normal.  Eyes normal.    Mouth/Throat:     Pharynx: Oropharynx is clear.  Eyes:     Conjunctiva/sclera: Conjunctivae normal.  Cardiovascular:     Rate and Rhythm: Normal rate and regular rhythm.     Heart sounds: Normal heart sounds. No murmur.  Pulmonary:     Effort: Pulmonary effort is normal.     Breath sounds: Normal breath sounds.     Comments: Lungs clear to auscultation Musculoskeletal:  General: Normal range of motion.     Cervical back: Normal range of motion and neck supple.  Skin:    General: Skin is warm and dry.     Comments: Scattered red erythematous eczematous areas noted on arms.  No open areas noted  Neurological:     Mental Status: He is alert and oriented to person, place, and time.  Psychiatric:        Mood and Affect: Mood normal.        Behavior: Behavior normal.        Thought Content: Thought content normal.        Judgment: Judgment normal.     Diagnostics: FVC 3.96, FEV1 3.36.  Predicted FVC 5.00, predicted FEV1 4.28.  Spirometry indicates mild  restriction.  This is consistent with previous spirometry readings.  Assessment and Plan: 1. Moderate persistent asthma without complication   2. Seasonal allergic rhinitis due to pollen   3. Intrinsic atopic dermatitis   4. Anaphylactic shock due to food, subsequent encounter     Meds ordered this encounter  Medications  . albuterol (PROVENTIL) (2.5 MG/3ML) 0.083% nebulizer solution    Sig: 1 vial in nebulizer every 4 hours as needed for coughing or wheezing.    Dispense:  75 mL    Refill:  1  . albuterol (VENTOLIN HFA) 108 (90 Base) MCG/ACT inhaler    Sig: Inhale 2 puffs into the lungs every 4 (four) hours as needed.    Dispense:  18 g    Refill:  1  . EPINEPHrine (EPIPEN 2-PAK) 0.3 mg/0.3 mL IJ SOAJ injection    Sig: Use as directed for severe allergic reactions    Dispense:  2 each    Refill:  1    Dispense mylan generic brand only. Not the adrenaclick please.  . fluticasone (FLONASE) 50 MCG/ACT nasal spray    Sig: 1 spray per nostril once a day if needed for stuffy nose.    Dispense:  16 g    Refill:  5  . triamcinolone ointment (KENALOG) 0.1 %    Sig: Apply twice daily to red itchy areas below face.    Dispense:  45 g    Refill:  3  . Fluticasone-Salmeterol (ADVAIR DISKUS) 250-50 MCG/DOSE AEPB    Sig: One puff twice a day to prevent coughing or wheezing. Rinse, gargle and spit out after use    Dispense:  60 each    Refill:  5    Patient Instructions  Asthma Continue Advair Diskus 250 mg-take 1 puff every 12 hours to prevent coughing or wheezing Ventolin 2 puffs every 4 hours if needed for wheezing or coughing spells or instead albuterol 0.083% 1 unit dose every 4 hours if needed Use Ventolin 2 puffs 5-15 minutes before exercise to decrease cough or wheeze  Allergic rhinitis Begin fluticasone 2 sprays per nostril once a day if needed for stuffy nose Begin saline nasal rinses as needed for nasal symptoms. Use this before any medicated nasal sprays for best  result Begin Mucinex [phone] mg twice a day for and increase hydration as tolerated until your nasal symptoms are resolved Continue cetirizine 10 mg-take 1 tablet once a day only if needed for runny nose for itching   Atopic dermatitis Continue on the treatment of his eczema prescribed by his dermatologist Continue dupilumab 300 mg once every 2 weeks  Food allergy Continue to avoid fish, shellfish, peanut and lentils.  If he has an allergic reaction give Benadryl 50  mg every 4 hours and if he has life-threatening symptoms inject with EpiPen 0.3 mg  Continue the other medications as listed in the chart  Call the clinic if this treatment plan is not working well for you  Follow up in 2 months or sooner if needed   Return in about 2 months (around 12/01/2019), or if symptoms worsen or fail to improve.    Thank you for the opportunity to care for this patient.  Please do not hesitate to contact me with questions.  Thermon Leyland, FNP [facility]  ________________________________________________  I have provided oversight concerning [facility]'s evaluation and treatment of this patient's health issues addressed during today's encounter.  I agree with the assessment and therapeutic plan as outlined in the note.   Signed,   R Jorene Guest, MD

## 2019-10-01 NOTE — Patient Instructions (Addendum)
Asthma Continue Advair Diskus 250 mg-take 1 puff every 12 hours to prevent coughing or wheezing Ventolin 2 puffs every 4 hours if needed for wheezing or coughing spells or instead albuterol 0.083% 1 unit dose every 4 hours if needed Use Ventolin 2 puffs 5-15 minutes before exercise to decrease cough or wheeze  Allergic rhinitis Begin fluticasone 2 sprays per nostril once a day if needed for stuffy nose Begin saline nasal rinses as needed for nasal symptoms. Use this before any medicated nasal sprays for best result Begin Mucinex [phone] mg twice a day for and increase hydration as tolerated until your nasal symptoms are resolved Continue cetirizine 10 mg-take 1 tablet once a day only if needed for runny nose for itching   Atopic dermatitis Continue on the treatment of his eczema prescribed by his dermatologist Continue dupilumab 300 mg once every 2 weeks  Food allergy Continue to avoid fish, shellfish, peanut and lentils.  If he has an allergic reaction give Benadryl 50 mg every 4 hours and if he has life-threatening symptoms inject with EpiPen 0.3 mg  Continue the other medications as listed in the chart  Call the clinic if this treatment plan is not working well for you  Follow up in 2 months or sooner if needed

## 2019-10-02 ENCOUNTER — Telehealth: Payer: Self-pay

## 2019-10-02 NOTE — Telephone Encounter (Signed)
Ambs, Norvel Richards, FNP  [facility] [place] [facility]  Can you please call this patient and have him make a follow up appointment for 2 months instead of 6 months? Thank you   Lm for pts parents to call us back to schedule this

## 2019-10-07 DIAGNOSIS — L209 Atopic dermatitis, unspecified: Principal | ICD-10-CM

## 2019-10-07 MED FILL — CLOBETASOL 0.05 % TOPICAL OINTMENT: 30 days supply | Qty: 120 | Fill #2 | Status: AC

## 2019-10-07 MED FILL — CLOBETASOL 0.05 % TOPICAL OINTMENT: TOPICAL | 30 days supply | Qty: 120 | Fill #2

## 2019-10-07 MED FILL — TRIAMCINOLONE ACETONIDE 0.1 % TOPICAL OINTMENT: TOPICAL | 30 days supply | Qty: 454 | Fill #2

## 2019-10-07 MED FILL — TRIAMCINOLONE ACETONIDE 0.1 % TOPICAL OINTMENT: 30 days supply | Qty: 454 | Fill #2 | Status: AC

## 2019-10-07 NOTE — Unmapped (Signed)
Barry Bond. 's dupixent shipment will be delayed as a result of prior authorization being required by the patient's insurance.     I have reached out to the patient and left a voicemail message.  We will call the patient back to reschedule the delivery upon resolution. We have not confirmed the new delivery date.

## 2019-10-08 MED FILL — DUPIXENT 300 MG/2 ML SUBCUTANEOUS SYRINGE: 28 days supply | Qty: 4 | Fill #7 | Status: AC

## 2019-10-08 MED FILL — DUPIXENT 300 MG/2 ML SUBCUTANEOUS SYRINGE: 28 days supply | Qty: 4 | Fill #7

## 2019-10-08 NOTE — Unmapped (Signed)
Barry Bond. 's DUPIXENT shipment will be sent out  as a result of prior authorization now approved.     I have reached out to the patient and communicated the delivery change. We will reschedule the medication for the delivery date that the patient agreed upon.  We have confirmed the delivery date as 10/09/19 VIA UPS

## 2019-10-15 ENCOUNTER — Other Ambulatory Visit: Payer: Self-pay

## 2019-10-15 ENCOUNTER — Ambulatory Visit (INDEPENDENT_AMBULATORY_CARE_PROVIDER_SITE_OTHER): Payer: Medicaid Other

## 2019-10-15 DIAGNOSIS — L209 Atopic dermatitis, unspecified: Secondary | ICD-10-CM

## 2019-10-29 ENCOUNTER — Ambulatory Visit (INDEPENDENT_AMBULATORY_CARE_PROVIDER_SITE_OTHER): Payer: Medicaid Other

## 2019-10-29 ENCOUNTER — Other Ambulatory Visit: Payer: Self-pay

## 2019-10-29 DIAGNOSIS — L209 Atopic dermatitis, unspecified: Secondary | ICD-10-CM

## 2019-10-30 NOTE — Unmapped (Signed)
[facility] Refill Coordination Note    Specialty Medication(s) to be Shipped:   Inflammatory Disorders: Dupixent    Other medication(s) to be shipped:      Barry Bond., DOB: [birth year]  Phone: [phone] (home)       All above HIPAA information was verified with patient's family member, father.     Was a Nurse, learning disability used for this call? No    Completed refill call assessment today to schedule patient's medication shipment from the [facility] [phone]).       Specialty medication(s) and dose(s) confirmed: Regimen is correct and unchanged.   Changes to medications: Barry Bond reports no changes at this time.  Changes to insurance: No  Questions for the pharmacist: No    Confirmed patient received Welcome Packet with first shipment. The patient will receive a drug information handout for each medication shipped and additional FDA Medication Guides as required.       DISEASE/MEDICATION-SPECIFIC INFORMATION        For patients on injectable medications: Patient currently has 0 doses left.  Next injection is scheduled for 11/12/2019.    SPECIALTY MEDICATION ADHERENCE     Medication Adherence    Patient reported X missed doses in the last month: 0  Specialty Medication: Dupixent 300 mg/2 ml  Patient is on additional specialty medications: No  Any gaps in refill history greater than 2 weeks in the last 3 months: no  Demonstrates understanding of importance of adherence: yes  Informant: father  Reliability of informant: reliable  Support network for adherence: family member  Confirmed plan for next specialty medication refill: delivery by pharmacy  Refills needed for supportive medications: not needed                Dupixent 300 mg/2 ml. 0 days on hand      SHIPPING     Shipping address confirmed in Epic.     Delivery Scheduled: Yes, Expected medication delivery date: 11/05/2019.     Medication will be delivered via UPS to the prescription address in Epic WAM.    Tyr Franca D Christel Bai   [facility] Specialty Technician

## 2019-11-04 MED FILL — DUPIXENT 300 MG/2 ML SUBCUTANEOUS SYRINGE: 28 days supply | Qty: 4 | Fill #8

## 2019-11-04 MED FILL — DUPIXENT 300 MG/2 ML SUBCUTANEOUS SYRINGE: 28 days supply | Qty: 4 | Fill #8 | Status: AC

## 2019-11-12 ENCOUNTER — Other Ambulatory Visit: Payer: Self-pay

## 2019-11-12 ENCOUNTER — Ambulatory Visit (INDEPENDENT_AMBULATORY_CARE_PROVIDER_SITE_OTHER): Payer: Medicaid Other

## 2019-11-12 DIAGNOSIS — L209 Atopic dermatitis, unspecified: Secondary | ICD-10-CM | POA: Diagnosis not present

## 2019-11-26 ENCOUNTER — Other Ambulatory Visit: Payer: Self-pay

## 2019-11-26 ENCOUNTER — Ambulatory Visit (INDEPENDENT_AMBULATORY_CARE_PROVIDER_SITE_OTHER): Payer: Medicaid Other

## 2019-11-26 DIAGNOSIS — L209 Atopic dermatitis, unspecified: Secondary | ICD-10-CM

## 2019-11-27 NOTE — Unmapped (Signed)
[facility] Refill Coordination Note    Specialty Medication(s) to be Shipped:   Inflammatory Disorders: Dupixent    Other medication(s) to be shipped:      Barry Bond., DOB: March 28, 2001  Phone: [phone] (home)       All above HIPAA information was verified with patient's family member, father.     Was a Nurse, learning disability used for this call? No    Completed refill call assessment today to schedule patient's medication shipment from the [facility] [phone]).       Specialty medication(s) and dose(s) confirmed: Regimen is correct and unchanged.   Changes to medications: Barry Bond reports no changes at this time.  Changes to insurance: No  Questions for the pharmacist: No    Confirmed patient received Welcome Packet with first shipment. The patient will receive a drug information handout for each medication shipped and additional FDA Medication Guides as required.       DISEASE/MEDICATION-SPECIFIC INFORMATION        For patients on injectable medications: Patient currently has 1 doses left.  Next injection is scheduled for 12/10/2019.    SPECIALTY MEDICATION ADHERENCE     Medication Adherence    Patient reported X missed doses in the last month: 0  Specialty Medication: Dupixent 300 mg/2 ml  Patient is on additional specialty medications: No  Any gaps in refill history greater than 2 weeks in the last 3 months: no  Demonstrates understanding of importance of adherence: yes  Informant: father  Reliability of informant: reliable  Support network for adherence: family member  Confirmed plan for next specialty medication refill: delivery by pharmacy  Refills needed for supportive medications: not needed                Dupixent 300 mg/2 ml. 14 days on hand      SHIPPING     Shipping address confirmed in Epic.     Delivery Scheduled: Yes, Expected medication delivery date: 12/10/2019.     Medication will be delivered via UPS to the prescription address in Epic WAM.    Barry Bond Barry Bond   [facility] Specialty Technician

## 2019-12-09 MED FILL — DUPIXENT 300 MG/2 ML SUBCUTANEOUS SYRINGE: 28 days supply | Qty: 4 | Fill #9

## 2019-12-09 MED FILL — DUPIXENT 300 MG/2 ML SUBCUTANEOUS SYRINGE: 28 days supply | Qty: 4 | Fill #9 | Status: AC

## 2019-12-10 ENCOUNTER — Ambulatory Visit (INDEPENDENT_AMBULATORY_CARE_PROVIDER_SITE_OTHER): Payer: Medicaid Other

## 2019-12-10 DIAGNOSIS — L209 Atopic dermatitis, unspecified: Secondary | ICD-10-CM | POA: Diagnosis not present

## 2019-12-24 ENCOUNTER — Ambulatory Visit: Payer: Medicaid Other

## 2019-12-29 NOTE — Unmapped (Signed)
[facility] Refill Coordination Note    Specialty Medication(s) to be Shipped:   Inflammatory Disorders: Dupixent    Other medication(s) to be shipped:       Barry Bond., DOB: 06/04/2001  Phone: [phone] (home)       All above HIPAA information was verified with patient's family member, fatehr.     Was a Nurse, learning disability used for this call? No    Completed refill call assessment today to schedule patient's medication shipment from the [facility] [phone]).       Specialty medication(s) and dose(s) confirmed: Regimen is correct and unchanged.   Changes to medications: Barry Bond reports no changes at this time.  Changes to insurance: No  Questions for the pharmacist: No    Confirmed patient received Welcome Packet with first shipment. The patient will receive a drug information handout for each medication shipped and additional FDA Medication Guides as required.       DISEASE/MEDICATION-SPECIFIC INFORMATION        For patients on injectable medications: Patient currently has 0 doses left.  Next injection is scheduled for 07/08.    SPECIALTY MEDICATION ADHERENCE     Medication Adherence    Support network for adherence: family member                Dupixent 300/2 mg/ml: 0 days of medicine on hand         SHIPPING     Shipping address confirmed in Epic.     Delivery Scheduled: Yes, Expected medication delivery date: 07/08.     Medication will be delivered via UPS to the prescription address in Epic WAM.    [name]   [facility] Specialty Technician

## 2019-12-30 MED FILL — DUPIXENT 300 MG/2 ML SUBCUTANEOUS SYRINGE: 28 days supply | Qty: 4 | Fill #10 | Status: AC

## 2019-12-30 MED FILL — DUPIXENT 300 MG/2 ML SUBCUTANEOUS SYRINGE: 28 days supply | Qty: 4 | Fill #10

## 2020-01-08 ENCOUNTER — Other Ambulatory Visit: Payer: Self-pay

## 2020-01-08 ENCOUNTER — Ambulatory Visit (INDEPENDENT_AMBULATORY_CARE_PROVIDER_SITE_OTHER): Payer: Medicaid Other

## 2020-01-08 DIAGNOSIS — L209 Atopic dermatitis, unspecified: Secondary | ICD-10-CM | POA: Diagnosis not present

## 2020-01-20 NOTE — Unmapped (Signed)
[facility] Refill Coordination Note    Specialty Medication(s) to be Shipped:   Inflammatory Disorders: Dupixent    Other medication(s) to be shipped: No additional medications requested for fill at this time     Barry Bond., DOB: 03-24-2001  Phone: [phone] (home)       All above HIPAA information was verified with patient's family member, father.     Was a Nurse, learning disability used for this call? No    Completed refill call assessment today to schedule patient's medication shipment from the [facility] [phone]).       Specialty medication(s) and dose(s) confirmed: Regimen is correct and unchanged.   Changes to medications: Barry Bond reports no changes at this time.  Changes to insurance: No  Questions for the pharmacist: No    Confirmed patient received Welcome Packet with first shipment. The patient will receive a drug information handout for each medication shipped and additional FDA Medication Guides as required.       DISEASE/MEDICATION-SPECIFIC INFORMATION        For patients on injectable medications: Patient currently has 1 doses left.  Next injection is scheduled for 01/21/2020.    SPECIALTY MEDICATION ADHERENCE     Medication Adherence    Patient reported X missed doses in the last month: 0  Specialty Medication: Dupixent 300 mg/2 ml  Patient is on additional specialty medications: No  Any gaps in refill history greater than 2 weeks in the last 3 months: no  Demonstrates understanding of importance of adherence: yes  Informant: father  Reliability of informant: reliable  Support network for adherence: family member  Confirmed plan for next specialty medication refill: delivery by pharmacy  Refills needed for supportive medications: not needed                      SHIPPING     Shipping address confirmed in Epic.     Delivery Scheduled: Yes, Expected medication delivery date: 01/28/2020.     Medication will be delivered via UPS to the prescription address in Epic WAM.    Barry Bond D Barry Bond   [facility] Specialty Technician

## 2020-01-22 ENCOUNTER — Ambulatory Visit: Payer: Self-pay

## 2020-01-25 ENCOUNTER — Ambulatory Visit (INDEPENDENT_AMBULATORY_CARE_PROVIDER_SITE_OTHER): Payer: Medicaid Other

## 2020-01-25 ENCOUNTER — Other Ambulatory Visit: Payer: Self-pay

## 2020-01-25 DIAGNOSIS — L209 Atopic dermatitis, unspecified: Secondary | ICD-10-CM | POA: Diagnosis not present

## 2020-01-27 MED FILL — DUPIXENT 300 MG/2 ML SUBCUTANEOUS SYRINGE: 28 days supply | Qty: 4 | Fill #11

## 2020-01-27 MED FILL — DUPIXENT 300 MG/2 ML SUBCUTANEOUS SYRINGE: 28 days supply | Qty: 4 | Fill #11 | Status: AC

## 2020-02-08 ENCOUNTER — Other Ambulatory Visit: Payer: Self-pay

## 2020-02-08 ENCOUNTER — Ambulatory Visit (INDEPENDENT_AMBULATORY_CARE_PROVIDER_SITE_OTHER): Payer: Medicaid Other

## 2020-02-08 DIAGNOSIS — L209 Atopic dermatitis, unspecified: Secondary | ICD-10-CM

## 2020-02-17 DIAGNOSIS — L209 Atopic dermatitis, unspecified: Principal | ICD-10-CM

## 2020-02-17 MED ORDER — DUPIXENT 300 MG/2 ML SUBCUTANEOUS SYRINGE
11 refills | 0.00000 days
Start: 2020-02-17 — End: 2021-02-16
  Filled 2020-02-24: qty 4, 28d supply, fill #0

## 2020-02-17 NOTE — Unmapped (Signed)
Barry Bond is doing well on the Dupixent.  His face is mostly clear but he does have some redness on his face.  Father thinks this is due to missing 2 doses of the medicine.  Their August shipment was taken from the porch after delivery.  I explained if that happens again to give [facility] a call to see if we can get the insurance replace it.  Dad verbalized understanding.  No eye irritation noted.     [facility] Clinical Assessment & Refill Coordination Note    Barry Einstein., DOB: Jun 02, 2001  Phone: [phone] (home)     All above HIPAA information was verified with patient's family member, father.     Was a Nurse, learning disability used for this call? No    Specialty Medication(s):   Inflammatory Disorders: Dupixent     Current Outpatient Medications   Medication Sig Dispense Refill   ??? albuterol (PROVENTIL HFA;VENTOLIN HFA) 90 mcg/actuation inhaler Inhale 1 puff every six (6) hours as needed.      ??? albuterol (PROVENTIL HFA;VENTOLIN HFA) 90 mcg/actuation inhaler Inhale 2 puffs.     ??? albuterol 2.5 mg /3 mL (0.083 %) nebulizer solution INHALE THE CONTENTS OF 1 VIAL EVERY 6 HOURS AS NEEDED FOR COUGH OR WHEEZE     ??? ALBUTEROL INHL   prn as needed     ??? beclomethasone (QVAR) 80 mcg/actuation inhaler Inhale 2 puffs Two (2) times a day.      ??? calcium carbonate-vitamin D3 600 mg(1,500mg ) -800 unit Tab TAKE 1 TABLET BY MOUTH EVERY DAY 90 tablet 3   ??? cetirizine (ZYRTEC) 10 MG tablet Take 1 tablet (10 mg total) by mouth daily. 90 tablet 3   ??? clobetasoL (TEMOVATE) 0.05 % ointment Apply topically Two (2) times a day. 120 g 11   ??? dupilumab (DUPIXENT) 300 mg/2 mL Syrg injection INJECT THE CONTENTS OF 1 SYRINGE (300MG ) UNDER THE SKIN EVERY 14 DAYS 4 mL 11   ??? EPINEPHrine (EPIPEN) 0.3 mg/0.3 mL (1:1,000) injection      ??? EPINEPHrine (EPIPEN) 0.3 mg/0.3 mL injection Inject 0.3 mL (0.3 mg total) into the muscle once. for 1 dose In case of anaphylaxis 2 each 3   ??? fluticasone (FLONASE) 50 mcg/actuation nasal spray      ??? fluticasone propion-salmeteroL (ADVAIR) 250-50 mcg/dose diskus Inhale 1 puff Two (2) times a day to prevent coughing or wheezing. Rinse, gargle and spit out after use. Due for 6 month check-up! 60 each 0   ??? fluticasone-salmeterol (ADVAIR DISKUS) 250-50 mcg/dose diskus Inhale 1 puff daily. (Patient not taking: Reported on 03/27/2019) 60 each 0   ??? fluticasone-salmeterol (ADVAIR) 250-50 mcg/dose diskus Inhale 1 puff.     ??? predniSONE (DELTASONE) 10 MG tablet Take 1 tablet (10 mg total) by mouth daily. 30 tablet 0   ??? sulfamethoxazole-trimethoprim (BACTRIM DS) 800-160 mg per tablet Take one tablet by mouth daily 30 tablet 0   ??? tacrolimus (PROTOPIC) 0.03 % ointment Apply topically.     ??? triamcinolone (KENALOG) 0.1 % ointment Apply topically Two (2) times a day. To affected areas on the body. 453.6 g 11     No current facility-administered medications for this visit.        Changes to medications: Barry Bond reports no changes at this time.    Allergies   Allergen Reactions   ??? Benadryl [Diphenhydramine Hcl] Swelling   ??? Peanut Swelling and Other (See Comments)     Face   ??? Peanut Oil  Anaphylaxis     Pt has an epi pen  Pt has an epi pen   ??? Soy Anaphylaxis   ??? Cetirizine Other (See Comments)     Other reaction(s): Unknown   ??? Cyproheptadine Other (See Comments)     Other Reaction: Other reaction   ??? Fish Containing Products Other (See Comments)     Unknown  Other reaction(s): UNKNOWN   ??? Hydroxyzine Other (See Comments)   ??? Shellfish Containing Products      Unknown   ??? Nickel Rash       Changes to allergies: No    SPECIALTY MEDICATION ADHERENCE     Dupixent 300/2 mg/ml: 18 days of medicine on hand       Medication Adherence    Patient reported X missed doses in the last month: 0  Specialty Medication: Dupixent 300 mg/2 ml  Patient is on additional specialty medications: No  Informant: patient  Reasons for non-adherence: no problems identified  Support network for adherence: family member  Confirmed plan for next specialty medication refill: delivery by pharmacy  Refills needed for supportive medications: yes, ordered or provider notified          Specialty medication(s) dose(s) confirmed: Regimen is correct and unchanged.     Are there any concerns with adherence? No    Adherence counseling provided? Not needed    CLINICAL MANAGEMENT AND INTERVENTION      Clinical Benefit Assessment:    Do you feel the medicine is effective or helping your condition? Yes    Clinical Benefit counseling provided? Not needed    Adverse Effects Assessment:    Are you experiencing any side effects? No    Are you experiencing difficulty administering your medicine? No    Quality of Life Assessment:    How many days over the past month did your AD  keep you from your normal activities? For example, brushing your teeth or getting up in the morning. 0    Have you discussed this with your provider? Not needed    Therapy Appropriateness:    Is therapy appropriate? Yes, therapy is appropriate and should be continued    DISEASE/MEDICATION-SPECIFIC INFORMATION      For patients on injectable medications: Patient currently has 1 doses left.  Next injection is scheduled for 02/22/20.    PATIENT SPECIFIC NEEDS     - Does the patient have any physical, cognitive, or cultural barriers? No    - Is the patient high risk? No    - Does the patient require a Care Management Plan? No     - Does the patient require physician intervention or other additional services (i.e. nutrition, smoking cessation, social work)? No      SHIPPING     Specialty Medication(s) to be Shipped:   Inflammatory Disorders: Dupixent    Other medication(s) to be shipped: No additional medications requested for fill at this time     Changes to insurance: No    Delivery Scheduled: Yes, Expected medication delivery date: 02/25/20.  However, Rx request for refills was sent to the provider as there are none remaining.     Medication will be delivered via UPS to the confirmed prescription address in [facility].    The patient will receive a drug information handout for each medication shipped and additional FDA Medication Guides as required.  Verified that patient has previously received a Conservation officer, historic buildings.    All of the patient's questions and concerns have been addressed.  Zane Pellecchia Vangie Bicker   [facility] Specialty Pharmacist

## 2020-02-18 NOTE — Unmapped (Signed)
Appointment 10/15 at 2:15 pm with Dr Izora Ribas    Thanks!

## 2020-02-22 ENCOUNTER — Ambulatory Visit (INDEPENDENT_AMBULATORY_CARE_PROVIDER_SITE_OTHER): Payer: Medicaid Other | Admitting: *Deleted

## 2020-02-22 ENCOUNTER — Other Ambulatory Visit: Payer: Self-pay

## 2020-02-22 DIAGNOSIS — L209 Atopic dermatitis, unspecified: Secondary | ICD-10-CM

## 2020-02-24 MED FILL — DUPIXENT 300 MG/2 ML SUBCUTANEOUS SYRINGE: 28 days supply | Qty: 4 | Fill #0 | Status: AC

## 2020-03-07 ENCOUNTER — Ambulatory Visit (INDEPENDENT_AMBULATORY_CARE_PROVIDER_SITE_OTHER): Payer: Medicaid Other

## 2020-03-07 DIAGNOSIS — L209 Atopic dermatitis, unspecified: Secondary | ICD-10-CM | POA: Diagnosis not present

## 2020-03-17 NOTE — Unmapped (Signed)
[facility] Refill Coordination Note    Specialty Medication(s) to be Shipped:   Inflammatory Disorders: Dupixent    Other medication(s) to be shipped: No additional medications requested for fill at this time     Barry Bond., DOB: 09-Aug-2000  Phone: There are no phone numbers on file.      All above HIPAA information was verified with patient's family member, father.     Was a Nurse, learning disability used for this call? No    Completed refill call assessment today to schedule patient's medication shipment from the [facility] [phone]).       Specialty medication(s) and dose(s) confirmed: Regimen is correct and unchanged.   Changes to medications: Barry Bond reports no changes at this time.  Changes to insurance: No  Questions for the pharmacist: No    Confirmed patient received Welcome Packet with first shipment. The patient will receive a drug information handout for each medication shipped and additional FDA Medication Guides as required.       DISEASE/MEDICATION-SPECIFIC INFORMATION        For patients on injectable medications: Patient currently has 1 doses left.  Next injection is scheduled for 03/21/2020.    SPECIALTY MEDICATION ADHERENCE     Medication Adherence    Patient reported X missed doses in the last month: 0  Specialty Medication: Dupixent 300 mg/2 ml   Patient is on additional specialty medications: No  Any gaps in refill history greater than 2 weeks in the last 3 months: no  Demonstrates understanding of importance of adherence: yes  Informant: father  Reliability of informant: reliable  Support network for adherence: family member  Confirmed plan for next specialty medication refill: delivery by pharmacy  Refills needed for supportive medications: not needed                      SHIPPING     Shipping address confirmed in Epic.     Delivery Scheduled: Yes, Expected medication delivery date: 03/24/2020.     Medication will be delivered via UPS to the prescription address in Epic [facility].    Barry Bond D Barry Bond   [facility] Specialty Technician

## 2020-03-21 ENCOUNTER — Ambulatory Visit (INDEPENDENT_AMBULATORY_CARE_PROVIDER_SITE_OTHER): Payer: Medicaid Other | Admitting: *Deleted

## 2020-03-21 DIAGNOSIS — L209 Atopic dermatitis, unspecified: Secondary | ICD-10-CM | POA: Diagnosis not present

## 2020-03-23 MED FILL — DUPIXENT 300 MG/2 ML SUBCUTANEOUS SYRINGE: SUBCUTANEOUS | 28 days supply | Qty: 4 | Fill #1

## 2020-03-23 MED FILL — DUPIXENT 300 MG/2 ML SUBCUTANEOUS SYRINGE: 28 days supply | Qty: 4 | Fill #1 | Status: AC

## 2020-04-04 ENCOUNTER — Other Ambulatory Visit: Payer: Self-pay

## 2020-04-04 ENCOUNTER — Ambulatory Visit (INDEPENDENT_AMBULATORY_CARE_PROVIDER_SITE_OTHER): Payer: Medicaid Other

## 2020-04-04 DIAGNOSIS — L209 Atopic dermatitis, unspecified: Secondary | ICD-10-CM

## 2020-04-07 NOTE — Unmapped (Signed)
Pt's father reports that pt has had a cough x 1-2 months.  Pt has asthma and has been using nebulizers more frequently during this time period with some relief.  No recent covid testing- last covid test in April 2021 prior to current increase in asthma symptoms.  Father prefers to seek out covid testing and follow up with pcp regarding this recent exacerbation.  Dermatology provider updated prior to tomorrow's scheduled clinic visit.  Eczema is flaring per father and they prefer in person over virtual care.    Call made with spanish interpreter on the line for entirety of call but pt's father declined to utilize translation services.

## 2020-04-07 NOTE — Unmapped (Signed)
Patient's father reported to pre roomer that pt has a cough, was tested for covid and found negative.  Father reported pt h/o asthma to pre roomer.  Attempted to call to assess further and left VM with spanish interpreter Psychologist, prison and probation services interpreters).

## 2020-04-08 ENCOUNTER — Encounter: Admit: 2020-04-08 | Discharge: 2020-04-09 | Payer: MEDICAID | Attending: Dermatology | Primary: Dermatology

## 2020-04-08 LAB — COMPREHENSIVE METABOLIC PANEL
ALBUMIN: 4.5 g/dL (ref 3.4–5.0)
ALKALINE PHOSPHATASE: 76 U/L (ref 46–116)
ALT (SGPT): 58 U/L — ABNORMAL HIGH (ref 10–49)
ANION GAP: 8 mmol/L (ref 5–14)
AST (SGOT): 40 U/L — ABNORMAL HIGH (ref <=34)
BLOOD UREA NITROGEN: 11 mg/dL (ref 9–23)
BUN / CREAT RATIO: 16
CALCIUM: 9.8 mg/dL (ref 8.7–10.4)
CHLORIDE: 105 mmol/L (ref 98–107)
CO2: 24 mmol/L (ref 20.0–31.0)
CREATININE: 0.68 mg/dL — ABNORMAL LOW
EGFR CKD-EPI AA MALE: >90 mL/min/{1.73_m2} (ref >=60)
EGFR CKD-EPI NON-AA MALE: >90 mL/min/{1.73_m2} (ref >=60)
GLUCOSE RANDOM: 93 mg/dL (ref 70–179)
POTASSIUM: 3.9 mmol/L (ref 3.4–4.5)
PROTEIN TOTAL: 8 g/dL (ref 5.7–8.2)
SODIUM: 137 mmol/L (ref 135–145)

## 2020-04-08 LAB — AST (SGOT): Aspartate aminotransferase:CCnc:Pt:Ser/Plas:Qn:: 40 — ABNORMAL HIGH

## 2020-04-08 LAB — CBC W/ AUTO DIFF
BASOPHILS ABSOLUTE COUNT: 0.1 10*9/L (ref 0.0–0.1)
BASOPHILS RELATIVE PERCENT: 0.7 %
EOSINOPHILS ABSOLUTE COUNT: 1.8 10*9/L — ABNORMAL HIGH (ref 0.0–0.4)
EOSINOPHILS RELATIVE PERCENT: 16.6 %
HEMATOCRIT: 47.3 % (ref 41.0–53.0)
HEMOGLOBIN: 15.3 g/dL (ref 13.5–17.5)
LARGE UNSTAINED CELLS: 2 % (ref 0–4)
LYMPHOCYTES ABSOLUTE COUNT: 2.1 10*9/L (ref 1.5–5.0)
LYMPHOCYTES RELATIVE PERCENT: 19.5 %
MEAN CORPUSCULAR VOLUME: 88 fL (ref 80.0–100.0)
MONOCYTES ABSOLUTE COUNT: 0.4 10*9/L (ref 0.2–0.8)
MONOCYTES RELATIVE PERCENT: 3.3 %
NEUTROPHILS ABSOLUTE COUNT: 6.1 10*9/L (ref 2.0–7.5)
NEUTROPHILS RELATIVE PERCENT: 57.8 %
PLATELET COUNT: 380 10*9/L (ref 150–440)
RED BLOOD CELL COUNT: 5.37 10*12/L (ref 4.50–5.90)
WBC ADJUSTED: 10.5 10*9/L (ref 4.5–11.0)

## 2020-04-08 LAB — CHOLESTEROL: Cholesterol:MCnc:Pt:Ser/Plas:Qn:: 173

## 2020-04-08 LAB — HEMOGLOBIN: Hemoglobin:MCnc:Pt:Bld:Qn:: 15.3

## 2020-04-08 LAB — MAGNESIUM: Magnesium:MCnc:Pt:Ser/Plas:Qn:: 2.1

## 2020-04-08 MED ORDER — CEPHALEXIN 500 MG CAPSULE
ORAL_CAPSULE | Freq: Two times a day (BID) | ORAL | 0 refills | 14.00000 days | Status: CP
Start: 2020-04-08 — End: 2020-04-18

## 2020-04-08 MED ORDER — ALBUTEROL SULFATE 2.5 MG/3 ML (0.083 %) SOLUTION FOR NEBULIZATION
0 refills | 0.00000 days | Status: CP
Start: 2020-04-08 — End: ?

## 2020-04-08 MED ORDER — TRIAMCINOLONE ACETONIDE 0.1 % TOPICAL OINTMENT
Freq: Two times a day (BID) | TOPICAL | 3 refills | 0.00000 days | Status: CP
Start: 2020-04-08 — End: 2021-04-08

## 2020-04-08 MED ORDER — ALBUTEROL SULFATE HFA 90 MCG/ACTUATION AEROSOL INHALER
Freq: Four times a day (QID) | RESPIRATORY_TRACT | 2 refills | 0.00000 days | Status: CP | PRN
Start: 2020-04-08 — End: ?

## 2020-04-08 MED ORDER — CYCLOSPORINE MODIFIED 100 MG CAPSULE
ORAL_CAPSULE | Freq: Two times a day (BID) | ORAL | 2 refills | 30.00000 days | Status: CP
Start: 2020-04-08 — End: 2021-04-08

## 2020-04-08 MED ORDER — CLOBETASOL 0.05 % TOPICAL OINTMENT
Freq: Two times a day (BID) | TOPICAL | 3 refills | 0.00000 days | Status: CP
Start: 2020-04-08 — End: 2021-04-08

## 2020-04-08 NOTE — Unmapped (Addendum)
-   Plan to increase dupixent injections from once every 14 days to once every 7 days pending insurance approval  - Apply triamcinolone to all affected areas. Cover with wet wraps as described below. Apply clobetasol to thicker areas of rash - avoid face and skin folds  - Please take Cephalexin 1 tablet twice a day for 2 weeks  - Please start Cyclosporine 200 mg (2 tablets) twice a day       [employer] Instructions      Medications:  ??? Triamcinolone cream applied to the neck, front and back of the trunk, arms and legs.      Instructions for Applying the Wraps   1. Line the bed with old sheets   2. Cover the patient???s neck, front and back of the trunk, arms, and legs with triamcinolone to the trunk, upper and lower extremities ??? avoid use on the face, armpits and groin. Apply Hydrocortisone to the face, armpits and groin, if needed.   3. After the steroids have been applied, cover the patient (neck, front and back of the trunk, arms and legs) with cotton towels, soaked with room temperature water.   4. Cover with with blankets to keep patient warm, as needed.   5. Leave in place for 1.5 hours, remove dressings, and pat dry.   6. Liberally apply Vaseline.   7. Repeat cycle TID.
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
## 2020-04-08 NOTE — Unmapped (Signed)
Updated father that provider ok to see patient in clinic today

## 2020-04-11 LAB — HEPATITIS B SURFACE ANTIBODY QUANT: Hepatitis B virus surface Ab:ACnc:Pt:Ser:Qn:: <8

## 2020-04-11 LAB — HEPATITIS B SURFACE ANTIGEN: Hepatitis B virus surface Ag:PrThr:Pt:Ser:Ord:: NONREACTIVE

## 2020-04-11 LAB — HEPATITIS B CORE TOTAL ANTIBODY: Hepatitis B virus core Ab:PrThr:Pt:Ser/Plas:Ord:IA: NONREACTIVE

## 2020-04-11 LAB — HEPATITIS B SURFACE ANTIBODY: HEPATITIS B SURFACE ANTIBODY: NONREACTIVE

## 2020-04-11 LAB — HEPATITIS C ANTIBODY: Hepatitis C virus Ab:PrThr:Pt:Ser:Ord:: NONREACTIVE

## 2020-04-14 ENCOUNTER — Telehealth: Payer: Self-pay

## 2020-04-14 DIAGNOSIS — L209 Atopic dermatitis, unspecified: Principal | ICD-10-CM

## 2020-04-14 LAB — TB MITOGEN VALUE: Lab: 0.45

## 2020-04-14 LAB — QUANTIFERON TB GOLD PLUS
QUANTIFERON ANTIGEN 1 MINUS NIL: -0.01 [IU]/mL
QUANTIFERON ANTIGEN 2 MINUS NIL: -0.02 [IU]/mL
QUANTIFERON TB NIL VALUE: 0.07 [IU]/mL

## 2020-04-14 LAB — TB AG1 VALUE: Lab: 0.06

## 2020-04-14 LAB — QUANTIFERON TB NIL VALUE: Lab: 0.07

## 2020-04-14 LAB — TB NIL VALUE: Lab: 0.07

## 2020-04-14 LAB — TB AG2 VALUE: Lab: 0.05

## 2020-04-14 MED ORDER — DUPIXENT 300 MG/2 ML SUBCUTANEOUS SYRINGE: 300 mg | mL | 11 refills | 28 days | Status: AC

## 2020-04-14 MED ORDER — DUPIXENT 300 MG/2 ML SUBCUTANEOUS SYRINGE
SUBCUTANEOUS | 11 refills | 28.00000 days | Status: CP
Start: 2020-04-14 — End: 2021-04-14
  Filled 2020-04-27: qty 8, 28d supply, fill #0

## 2020-04-14 MED ORDER — ALBUTEROL SULFATE HFA 108 (90 BASE) MCG/ACT IN AERS: 2.0000 | INHALATION_SPRAY | RESPIRATORY_TRACT | 0 refills | Status: DC | PRN

## 2020-04-14 NOTE — Addendum Note (Signed)
Addended by: Janie Morning on: 04/14/2020 12:03 PM   Modules accepted: Orders

## 2020-04-14 NOTE — Telephone Encounter (Addendum)
[employer] sent PA for albuterol HFA, will send in ProAir which is the peferred drug.  Courtesy [facility] with [facility]; 6 month follow-up due. Spoke with father and he will make appt soon.

## 2020-04-18 ENCOUNTER — Ambulatory Visit: Payer: Self-pay

## 2020-04-18 NOTE — Progress Notes (Deleted)
° °  [address] [place]  [address] Dept: [phone]  FOLLOW UP NOTE  Patient ID: Travis Morrison, male    DOB: 07-Apr-2001  Age: 19 y.o. MRN: [id number] Date of Office Visit: 04/19/2020  Assessment  Chief Complaint: No chief complaint on file.  HPI Travis Morrison    Drug Allergies:  Allergies  Allergen Reactions   Cyproheptadine Other (See Comments)    Other Reaction: Other reaction Other Reaction: Other reaction    Diphenhydramine Hcl Swelling   Peanuts [Peanut Oil] Anaphylaxis    Pt has an epi pen   Fish Allergy     Unknown   Hydroxyzine Other (See Comments)   Lentil    Shellfish Allergy Cough   Cephalexin Rash   Nickel Other (See Comments) and Rash    Other Reaction: Not Assessed     Physical Exam: There were no vitals taken for this visit.   Physical Exam  Diagnostics:    Assessment and Plan: No diagnosis found.  No orders of the defined types were placed in this encounter.   There are no Patient Instructions on file for this visit.  No follow-ups on file.    Thank you for the opportunity to care for this patient.  Please do not hesitate to contact me with questions.  Thermon Leyland, FNP [facility]

## 2020-04-19 ENCOUNTER — Ambulatory Visit: Payer: Medicaid Other | Admitting: Family Medicine

## 2020-04-19 DIAGNOSIS — L209 Atopic dermatitis, unspecified: Principal | ICD-10-CM

## 2020-04-20 NOTE — Unmapped (Signed)
[facility] Pharm[facility] Refill Coordination Note    Patient's father states they are aware of dose increase to 1 syringe every week    Specialty Medication(s) to be Shipped:   Inflammatory Disorders: Dupixent    Other medication(s) to be shipped: No additional medications requested for fill at this time     Barry Bond., DOB: 2001-03-02  Phone: There are no phone numbers on file.      All above HIPAA information was verified with patient's family member, father.     Was a Nurse, learning disability used for this call? No    Completed refill call assessment today to schedule patient's medication shipment from the [facility] [phone]).       Specialty medication(s) and dose(s) confirmed: Regimen is correct and unchanged.   Changes to medications: Barry Bond reports no changes at this time.  Changes to insurance: No  Questions for the pharmacist: No    Confirmed patient received Welcome Packet with first shipment. The patient will receive a drug information handout for each medication shipped and additional FDA Medication Guides as required.       DISEASE/MEDICATION-SPECIFIC INFORMATION        For patients on injectable medications: Patient currently has 0 doses left.  Next injection is scheduled for 04/25/2020.    SPECIALTY MEDICATION ADHERENCE     Medication Adherence    Patient reported X missed doses in the last month: 0  Specialty Medication: Dupixent 300 mg/2 ml   Patient is on additional specialty medications: No  Any gaps in refill history greater than 2 weeks in the last 3 months: no  Demonstrates understanding of importance of adherence: yes  Informant: father  Reliability of informant: reliable  Support network for adherence: family member  Confirmed plan for next specialty medication refill: delivery by pharmacy  Refills needed for supportive medications: not needed                      SHIPPING     Shipping address confirmed in Epic.     Delivery Scheduled: Yes, Expected medication delivery date: 04/22/2020.     Medication will be delivered via UPS to the prescription address in Epic [facility].    Barry Bond D Sakiya Stepka   [facility] Specialty Technician

## 2020-04-21 NOTE — Unmapped (Signed)
10/28 Dupixent Claim billed at [facility] 10/27. Called Dad, said he didnt want them to fill it, told him he would need to cancel the order with [facility] and then call [facility] back. Dad said he would call back when cancelled. KFR

## 2020-04-25 ENCOUNTER — Other Ambulatory Visit: Payer: Self-pay

## 2020-04-25 ENCOUNTER — Ambulatory Visit (INDEPENDENT_AMBULATORY_CARE_PROVIDER_SITE_OTHER): Payer: Medicaid Other

## 2020-04-25 ENCOUNTER — Ambulatory Visit (INDEPENDENT_AMBULATORY_CARE_PROVIDER_SITE_OTHER): Payer: Medicaid Other | Admitting: Family Medicine

## 2020-04-25 ENCOUNTER — Encounter: Payer: Self-pay | Admitting: Family Medicine

## 2020-04-25 VITALS — BP 102/78 | HR 68 | Temp 96.9°F | Resp 20 | Ht 66.0 in | Wt 180.4 lb

## 2020-04-25 DIAGNOSIS — J301 Allergic rhinitis due to pollen: Secondary | ICD-10-CM

## 2020-04-25 DIAGNOSIS — J4540 Moderate persistent asthma, uncomplicated: Secondary | ICD-10-CM | POA: Diagnosis not present

## 2020-04-25 DIAGNOSIS — L2084 Intrinsic (allergic) eczema: Secondary | ICD-10-CM

## 2020-04-25 DIAGNOSIS — T7800XD Anaphylactic reaction due to unspecified food, subsequent encounter: Secondary | ICD-10-CM

## 2020-04-25 DIAGNOSIS — L209 Atopic dermatitis, unspecified: Secondary | ICD-10-CM

## 2020-04-25 MED ORDER — FLUTICASONE PROPIONATE 50 MCG/ACT NA SUSP
NASAL | 5 refills | Status: DC
Start: 2020-04-25 — End: 2022-05-24

## 2020-04-25 MED ORDER — FLUTICASONE-SALMETEROL 250-50 MCG/DOSE IN AEPB
INHALATION_SPRAY | RESPIRATORY_TRACT | 5 refills | Status: DC
Start: 2020-04-25 — End: 2022-05-24

## 2020-04-25 MED ORDER — ALBUTEROL SULFATE HFA 108 (90 BASE) MCG/ACT IN AERS
2.0000 | INHALATION_SPRAY | RESPIRATORY_TRACT | 1 refills | Status: DC | PRN
Start: 2020-04-25 — End: 2020-12-07

## 2020-04-25 MED ORDER — ALBUTEROL SULFATE (2.5 MG/3ML) 0.083% IN NEBU
INHALATION_SOLUTION | RESPIRATORY_TRACT | 1 refills | Status: DC
Start: 2020-04-25 — End: 2020-12-07

## 2020-04-25 MED ORDER — CETIRIZINE HCL 10 MG PO TABS
ORAL_TABLET | ORAL | 5 refills | Status: DC
Start: 2020-04-25 — End: 2020-12-07

## 2020-04-25 NOTE — Progress Notes (Addendum)
[address] [place] [address] Dept: [phone]  FOLLOW UP NOTE  Patient ID: Travis Morrison, male    DOB: 2000-08-16  Age: 19 y.o. MRN: [id number] Date of Office Visit: 04/25/2020  Assessment  Chief Complaint: Allergies, Asthma, and Cough (and wheeze)  HPI Travis Morrison is a 19 year old male who presents to the clinic for follow-up visit.  He was last seen in this clinic on 10/01/2019 for evaluation of asthma, allergic rhinitis, atopic dermatitis, and food allergy to fish, shellfish, peanut, and mental.  At today's visit he reports his asthma has been moderately well controlled with occasional cough that began about 1 week ago.  He reports his cough has been dry.  He continues Advair 250-1 puff twice a day and he has been using albuterol once daily on a regular schedule.  He is not using albuterol as needed for shortness of breath, but instead he is using it on a scheduled basis.  Allergic rhinitis is reported as moderately well controlled with occasional sneezing and occasional postnasal drainage with throat clearing.  He continues cetirizine daily and occasional use of Flonase.  Atopic dermatitis is reported as well controlled with a daily moisturizer and Dupixent injections.  He reports no local reactions at the site of injection.  He reports his symptoms of atopic dermatitis have significantly decreased while continuing on Dupixent injections.  He continues to avoid fish, shellfish, peanut, and Bentyl with no accidental ingestion or EpiPen use since his last visit to this clinic.  His current medications are listed in the chart.   Drug Allergies:  Allergies  Allergen Reactions  . Cyproheptadine Other (See Comments)    Other Reaction: Other reaction Other Reaction: Other reaction   . Diphenhydramine Hcl Swelling  . Peanuts [Peanut Oil] Anaphylaxis    Pt has an epi pen  . Fish Allergy     Unknown  . Hydroxyzine Other (See Comments)  . Lentil   . Shellfish Allergy Cough   . Cephalexin Rash  . Nickel Other (See Comments) and Rash    Other Reaction: Not Assessed     Physical Exam: BP 102/78   Pulse 68   Temp (!) 96.9 F (36.1 C) (Temporal)   Resp 20   Ht 5\' 6"  (1.676 m)   Wt 180 lb 6.4 oz (81.8 kg)   SpO2 99%   BMI 29.12 kg/m    Physical Exam Vitals reviewed.  Constitutional:      Appearance: Normal appearance.  HENT:     Head: Normocephalic and atraumatic.     Right Ear: Tympanic membrane normal.     Left Ear: Tympanic membrane normal.     Mouth/Throat:     Comments: Bilateral nares slightly erythematous with clear nasal drainage noted.  Pharynx is slightly erythematous with no exudate.  Ears normal.  Eyes normal. Eyes:     Conjunctiva/sclera: Conjunctivae normal.  Cardiovascular:     Rate and Rhythm: Normal rate and regular rhythm.     Heart sounds: Normal heart sounds. No murmur heard.   Pulmonary:     Effort: Pulmonary effort is normal.     Breath sounds: Normal breath sounds.     Comments: Lungs clear to auscultation Musculoskeletal:        General: Normal range of motion.     Cervical back: Normal range of motion and neck supple.  Skin:    General: Skin is warm and dry.  Neurological:     Mental Status: He is alert and oriented to  person, place, and time.  Psychiatric:        Mood and Affect: Mood normal.        Behavior: Behavior normal.        Thought Content: Thought content normal.        Judgment: Judgment normal.     Diagnostics: FVC 3.94, FEV1 3.25.  Predicted FVC 4.96, predicted FEV1 4.18.  Spirometry indicates mild restriction.  This is consistent with previous spirometry  Assessment and Plan: 1. Moderate persistent asthma without complication   2. Seasonal allergic rhinitis due to pollen   3. Intrinsic atopic dermatitis   4. Anaphylactic shock due to food, subsequent encounter     Meds ordered this encounter  Medications  . Fluticasone-Salmeterol (ADVAIR DISKUS) 250-50 MCG/DOSE AEPB    Sig: One puff  twice a day to prevent coughing or wheezing. Rinse, gargle and spit out after use    Dispense:  60 each    Refill:  5  . cetirizine (ZYRTEC) 10 MG tablet    Sig: 1 tablet once a day if needed for runny nose or itchy eyes    Dispense:  34 tablet    Refill:  5  . albuterol (VENTOLIN HFA) 108 (90 Base) MCG/ACT inhaler    Sig: Inhale 2 puffs into the lungs every 4 (four) hours as needed.    Dispense:  18 g    Refill:  1  . albuterol (PROVENTIL) (2.5 MG/3ML) 0.083% nebulizer solution    Sig: 1 vial in nebulizer every 4 hours as needed for coughing or wheezing.    Dispense:  75 mL    Refill:  1  . fluticasone (FLONASE) 50 MCG/ACT nasal spray    Sig: 2 spray per nostril once a day if needed for stuffy nose.    Dispense:  16 g    Refill:  5    Patient Instructions  Asthma Continue Advair Diskus 250 mg-take 1 puff every 12 hours to prevent coughing or wheezing Continue albuterol 2 puffs every 4 hours if needed for wheezing or coughing spells or instead albuterol 0.083% 1 unit dose every 4 hours if needed. Try not to use this medication on a regular schedule if you are not coughing, wheezing or short of breath Use albuterol 2 puffs 5-15 minutes before exercise to decrease cough or wheeze  Allergic rhinitis Begin fluticasone 2 sprays per nostril once a day if needed for stuffy nose Begin saline nasal rinses as needed for nasal symptoms. Use this before any medicated nasal sprays for best result Continue cetirizine 10 mg-take 1 tablet once a day only if needed for runny nose for itching For thick post nasal drainage, begin Mucinex [phone] mg twice a day for and increase hydration as tolerated until your nasal symptoms are resolved  Atopic dermatitis Continue on the treatment of your eczema prescribed by his dermatologist Continue dupilumab 300 mg once every 2 weeks  Food allergy Continue to avoid fish, shellfish, peanut and lentils.  If he has an allergic reaction give Benadryl 50 mg every  4 hours and if he has life-threatening symptoms inject with EpiPen 0.3 mg  Continue the other medications as listed in the chart  Call the clinic if this treatment plan is not working well for you  Follow up in 6 months or sooner if needed   Return in about 6 months (around 10/23/2020), or if symptoms worsen or fail to improve.    Thank you for the opportunity to care for this patient.  Please do not hesitate to contact me with questions.  Thermon Leyland, FNP [facility]  ________________________________________________  I have provided oversight concerning [facility]'s evaluation and treatment of this patient's health issues addressed during today's encounter.  I agree with the assessment and therapeutic plan as outlined in the note.   Signed,   R Jorene Guest, MD

## 2020-04-25 NOTE — Patient Instructions (Signed)
Asthma Continue Advair Diskus 250 mg-take 1 puff every 12 hours to prevent coughing or wheezing Continue albuterol 2 puffs every 4 hours if needed for wheezing or coughing spells or instead albuterol 0.083% 1 unit dose every 4 hours if needed. Try not to use this medication on a regular schedule if you are not coughing, wheezing or short of breath Use albuterol 2 puffs 5-15 minutes before exercise to decrease cough or wheeze  Allergic rhinitis Begin fluticasone 2 sprays per nostril once a day if needed for stuffy nose Begin saline nasal rinses as needed for nasal symptoms. Use this before any medicated nasal sprays for best result Continue cetirizine 10 mg-take 1 tablet once a day only if needed for runny nose for itching For thick post nasal drainage, begin Mucinex [phone] mg twice a day for and increase hydration as tolerated until your nasal symptoms are resolved  Atopic dermatitis Continue on the treatment of your eczema prescribed by his dermatologist Continue dupilumab 300 mg once every 2 weeks  Food allergy Continue to avoid fish, shellfish, peanut and lentils.  If he has an allergic reaction give Benadryl 50 mg every 4 hours and if he has life-threatening symptoms inject with EpiPen 0.3 mg  Continue the other medications as listed in the chart  Call the clinic if this treatment plan is not working well for you  Follow up in 6 months or sooner if needed

## 2020-04-26 DIAGNOSIS — L209 Atopic dermatitis, unspecified: Secondary | ICD-10-CM | POA: Diagnosis not present

## 2020-04-27 MED FILL — DUPIXENT 300 MG/2 ML SUBCUTANEOUS SYRINGE: 28 days supply | Qty: 8 | Fill #0 | Status: AC

## 2020-04-27 NOTE — Unmapped (Signed)
I called and canceled the Dupixent script with [facility] [phone]

## 2020-04-27 NOTE — Unmapped (Signed)
Barry Bond. 's Dupixent shipment will be sent out  as a result of the medication is no longer refill too soon.      I have reached out to the patient and communicated the delivery change. We will reschedule the medication for the delivery date that the patient agreed upon.  We have confirmed the delivery date as 04/28/20, via ups.

## 2020-05-09 ENCOUNTER — Other Ambulatory Visit: Payer: Self-pay

## 2020-05-09 ENCOUNTER — Ambulatory Visit (INDEPENDENT_AMBULATORY_CARE_PROVIDER_SITE_OTHER): Payer: Medicaid Other

## 2020-05-09 DIAGNOSIS — L209 Atopic dermatitis, unspecified: Secondary | ICD-10-CM

## 2020-05-10 NOTE — Unmapped (Signed)
Spoke with father and he reports that they received weekly dosing of dupixent and started ~2 weeks ago. Continue on cyclosporine, deny headaches, neuropathy, tremors, feeling unwell. Skin is much improved. Advised to cut dose of cyclosporine in 1/2; 1 tablet twice a day now that he has start higher dose dupixent. Father expressed understanding. Will see back in clinic on 06/08/20. Advised to call sooner if any questions or concerns.

## 2020-05-11 NOTE — Unmapped (Signed)
Dupixent approved until 04/19/2021. Already scanned into chart

## 2020-05-13 NOTE — Unmapped (Signed)
[facility] Refill Coordination Note    Specialty Medication(s) to be Shipped:   Inflammatory Disorders: Dupixent    Other medication(s) to be shipped: No additional medications requested for fill at this time     Barry Bond., DOB: 02/16/2001  Phone: There are no phone numbers on file.      All above HIPAA information was verified with patient's family member, Father.     Was a Nurse, learning disability used for this call? No    Completed refill call assessment today to schedule patient's medication shipment from the [facility] [phone]).       Specialty medication(s) and dose(s) confirmed: Regimen is correct and unchanged.   Changes to medications: [name] reports no changes at this time.  Changes to insurance: No  Questions for the pharmacist: No    Confirmed patient received Welcome Packet with first shipment. The patient will receive a drug information handout for each medication shipped and additional FDA Medication Guides as required.       DISEASE/MEDICATION-SPECIFIC INFORMATION        N/A    SPECIALTY MEDICATION ADHERENCE     Medication Adherence    Patient reported X missed doses in the last month: 0  Specialty Medication: Dupixent 300mg /69ml  Patient is on additional specialty medications: No  Patient is on more than two specialty medications: No  Any gaps in refill history greater than 2 weeks in the last 3 months: no  Demonstrates understanding of importance of adherence: yes  Informant: father  Reliability of informant: reliable  Provider-estimated medication adherence level: good  Patient is at risk for Non-Adherence: No  Support network for adherence: family member                dupixent 300/2 mg/ml: 14 days of medicine on hand         SHIPPING     Shipping address confirmed in Epic.     Delivery Scheduled: Yes, Expected medication delivery date: 11/30.     Medication will be delivered via UPS to the prescription address in Epic WAM.    [name]   [facility] Specialty Technician

## 2020-05-23 ENCOUNTER — Other Ambulatory Visit: Payer: Self-pay

## 2020-05-23 ENCOUNTER — Ambulatory Visit (INDEPENDENT_AMBULATORY_CARE_PROVIDER_SITE_OTHER): Payer: Medicaid Other

## 2020-05-23 DIAGNOSIS — L209 Atopic dermatitis, unspecified: Secondary | ICD-10-CM

## 2020-05-23 MED FILL — DUPIXENT 300 MG/2 ML SUBCUTANEOUS SYRINGE: SUBCUTANEOUS | 28 days supply | Qty: 8 | Fill #1

## 2020-05-23 MED FILL — DUPIXENT 300 MG/2 ML SUBCUTANEOUS SYRINGE: 28 days supply | Qty: 8 | Fill #1 | Status: AC

## 2020-05-30 ENCOUNTER — Other Ambulatory Visit: Payer: Self-pay

## 2020-05-30 ENCOUNTER — Ambulatory Visit (INDEPENDENT_AMBULATORY_CARE_PROVIDER_SITE_OTHER): Payer: Medicaid Other

## 2020-05-30 DIAGNOSIS — J4540 Moderate persistent asthma, uncomplicated: Secondary | ICD-10-CM

## 2020-05-30 DIAGNOSIS — L209 Atopic dermatitis, unspecified: Secondary | ICD-10-CM | POA: Diagnosis not present

## 2020-06-09 NOTE — Unmapped (Signed)
[facility] Refill Coordination Note    Specialty Medication(s) to be Shipped:   Inflammatory Disorders: Dupixent    Other medication(s) to be shipped: No additional medications requested for fill at this time     Barry Bond., DOB: 2000/09/11  Phone: There are no phone numbers on file.      All above HIPAA information was verified with patient.     Was a Nurse, learning disability used for this call? No    Completed refill call assessment today to schedule patient's medication shipment from the [facility] [phone]).       Specialty medication(s) and dose(s) confirmed: Regimen is correct and unchanged.   Changes to medications: [name] reports no changes at this time.  Changes to insurance: No  Questions for the pharmacist: No    Confirmed patient received Welcome Packet with first shipment. The patient will receive a drug information handout for each medication shipped and additional FDA Medication Guides as required.       DISEASE/MEDICATION-SPECIFIC INFORMATION        For patients on injectable medications: Patient currently has 1 doses left.  Next injection is scheduled for 06/13/20.    SPECIALTY MEDICATION ADHERENCE     Medication Adherence    Patient reported X missed doses in the last month: 0  Specialty Medication: Dupixent  Patient is on additional specialty medications: No  Support network for adherence: family member                Dupixent 300/2 mg/ml: 4 days of medicine on hand          SHIPPING     Shipping address confirmed in Epic.     Delivery Scheduled: Yes, Expected medication delivery date: 06/15/20.     Medication will be delivered via UPS to the prescription address in Epic WAM.    Barry Bond   [facility] Specialty Technician

## 2020-06-13 ENCOUNTER — Ambulatory Visit: Payer: Self-pay

## 2020-06-14 MED FILL — DUPIXENT 300 MG/2 ML SUBCUTANEOUS SYRINGE: SUBCUTANEOUS | 28 days supply | Qty: 8 | Fill #2

## 2020-06-14 MED FILL — DUPIXENT 300 MG/2 ML SUBCUTANEOUS SYRINGE: 28 days supply | Qty: 8 | Fill #2 | Status: AC

## 2020-06-22 ENCOUNTER — Ambulatory Visit (INDEPENDENT_AMBULATORY_CARE_PROVIDER_SITE_OTHER): Payer: Medicaid Other

## 2020-06-22 DIAGNOSIS — L209 Atopic dermatitis, unspecified: Secondary | ICD-10-CM | POA: Diagnosis not present

## 2020-06-29 ENCOUNTER — Ambulatory Visit (INDEPENDENT_AMBULATORY_CARE_PROVIDER_SITE_OTHER): Payer: Medicaid Other | Admitting: *Deleted

## 2020-06-29 ENCOUNTER — Other Ambulatory Visit: Payer: Self-pay

## 2020-06-29 DIAGNOSIS — L209 Atopic dermatitis, unspecified: Secondary | ICD-10-CM | POA: Diagnosis not present

## 2020-07-06 ENCOUNTER — Other Ambulatory Visit: Payer: Self-pay

## 2020-07-06 ENCOUNTER — Ambulatory Visit (INDEPENDENT_AMBULATORY_CARE_PROVIDER_SITE_OTHER): Payer: Medicaid Other

## 2020-07-06 DIAGNOSIS — L209 Atopic dermatitis, unspecified: Secondary | ICD-10-CM | POA: Diagnosis not present

## 2020-07-07 NOTE — Unmapped (Signed)
[facility] Refill Coordination Note    Specialty Medication(s) to be Shipped:   Inflammatory Disorders: Dupixent    Other medication(s) to be shipped: No additional medications requested for fill at this time     Shamarr Faucett., DOB: [birth year]  Phone: There are no phone numbers on file.      All above HIPAA information was verified with patient's family member, father.     Was a Nurse, learning disability used for this call? No    Completed refill call assessment today to schedule patient's medication shipment from the [facility] [phone]).       Specialty medication(s) and dose(s) confirmed: Regimen is correct and unchanged.   Changes to medications: Ebon reports no changes at this time.  Changes to insurance: No  Questions for the pharmacist: No    Confirmed patient received Welcome Packet with first shipment. The patient will receive a drug information handout for each medication shipped and additional FDA Medication Guides as required.       DISEASE/MEDICATION-SPECIFIC INFORMATION        For patients on injectable medications: Patient currently has 1 doses left.  Next injection is scheduled for 07/11/2020.    SPECIALTY MEDICATION ADHERENCE     Medication Adherence    Patient reported X missed doses in the last month: 0  Specialty Medication: Dupixent 300 mg/2 ml   Patient is on additional specialty medications: No  Any gaps in refill history greater than 2 weeks in the last 3 months: no  Demonstrates understanding of importance of adherence: yes  Informant: father  Reliability of informant: reliable  Support network for adherence: family member  Confirmed plan for next specialty medication refill: delivery by pharmacy  Refills needed for supportive medications: not needed                Dupixent 300/2 mg/ml: 7 days of medicine on hand          SHIPPING     Shipping address confirmed in Epic.     Delivery Scheduled: Yes, Expected medication delivery date: 07/14/2020. Medication will be delivered via UPS to the prescription address in Epic WAM.    Dakarai Mcglocklin D Lennyx Verdell   [facility] Shared [facility] Specialty Technician

## 2020-07-13 ENCOUNTER — Other Ambulatory Visit: Payer: Self-pay

## 2020-07-13 ENCOUNTER — Ambulatory Visit (INDEPENDENT_AMBULATORY_CARE_PROVIDER_SITE_OTHER): Payer: Medicaid Other

## 2020-07-13 DIAGNOSIS — L209 Atopic dermatitis, unspecified: Secondary | ICD-10-CM | POA: Diagnosis not present

## 2020-07-13 MED FILL — DUPIXENT 300 MG/2 ML SUBCUTANEOUS SYRINGE: SUBCUTANEOUS | 28 days supply | Qty: 8 | Fill #3

## 2020-07-20 ENCOUNTER — Ambulatory Visit (INDEPENDENT_AMBULATORY_CARE_PROVIDER_SITE_OTHER): Payer: Medicaid Other

## 2020-07-20 ENCOUNTER — Other Ambulatory Visit: Payer: Self-pay

## 2020-07-20 DIAGNOSIS — L209 Atopic dermatitis, unspecified: Secondary | ICD-10-CM

## 2020-07-20 MED ORDER — DUPILUMAB 300 MG/2ML ~~LOC~~ SOSY
300.0000 mg | PREFILLED_SYRINGE | SUBCUTANEOUS | Status: DC
Start: 2020-07-20 — End: 2023-08-30
  Administered 2020-07-20 – 2023-08-06 (×149): 300 mg via SUBCUTANEOUS

## 2020-07-27 ENCOUNTER — Other Ambulatory Visit: Payer: Self-pay

## 2020-07-27 ENCOUNTER — Ambulatory Visit (INDEPENDENT_AMBULATORY_CARE_PROVIDER_SITE_OTHER): Payer: Medicaid Other

## 2020-07-27 DIAGNOSIS — L209 Atopic dermatitis, unspecified: Secondary | ICD-10-CM | POA: Diagnosis not present

## 2020-08-03 ENCOUNTER — Ambulatory Visit (INDEPENDENT_AMBULATORY_CARE_PROVIDER_SITE_OTHER): Payer: Medicaid Other

## 2020-08-03 ENCOUNTER — Other Ambulatory Visit: Payer: Self-pay

## 2020-08-03 DIAGNOSIS — L209 Atopic dermatitis, unspecified: Secondary | ICD-10-CM

## 2020-08-03 DIAGNOSIS — Z79899 Other long term (current) drug therapy: Principal | ICD-10-CM

## 2020-08-03 MED ORDER — CYCLOSPORINE MODIFIED 100 MG CAPSULE
ORAL_CAPSULE | Freq: Two times a day (BID) | ORAL | 2 refills | 30.00000 days
Start: 2020-08-03 — End: 2021-08-03

## 2020-08-03 NOTE — Unmapped (Signed)
I spoke with Cylas's father regarding his Dupixent. He reports his eczema is stable and improved since the increase to weekly dosing. I asked about cylosporine (sent to [facility]), and he reports they ran out of medicine recently. I followed up with [facility] to make sure no PA issues, and the pharmacist reported only filling x1 on 10/18. I'll reach out to clinic to see if appropriate to restart vs if labs are needed first.     [facility] Clinical Assessment & Refill Coordination Note    Timothee Gali., DOB: 21-Sep-2000  Phone: There are no phone numbers on file.    All above HIPAA information was verified with patient's family member, father.     Was a Nurse, learning disability used for this call? No    Specialty Medication(s):   Inflammatory Disorders: Dupixent     Current Outpatient Medications   Medication Sig Dispense Refill   ??? albuterol 2.5 mg /3 mL (0.083 %) nebulizer solution Use as instructed 6 mL 0   ??? albuterol HFA 90 mcg/actuation inhaler Inhale 1 puff every six (6) hours as needed. 18 g 2   ??? beclomethasone (QVAR) 80 mcg/actuation inhaler Inhale 2 puffs Two (2) times a day.      ??? calcium carbonate-vitamin D3 600 mg(1,500mg ) -800 unit Tab TAKE 1 TABLET BY MOUTH EVERY DAY 90 tablet 3   ??? cetirizine (ZYRTEC) 10 MG tablet Take 1 tablet (10 mg total) by mouth daily. 90 tablet 3   ??? clobetasoL (TEMOVATE) 0.05 % ointment Apply topically Two (2) times a day. 120 g 11   ??? clobetasoL (TEMOVATE) 0.05 % ointment Apply topically Two (2) times a day. To thicker areas of rash, avoid face and skin folds 120 g 3   ??? cycloSPORINE modified (NEORAL) 100 MG capsule Take 2 capsules (200 mg total) by mouth Two (2) times a day. 120 capsule 2   ??? dupilumab (DUPIXENT SYRINGE) 300 mg/2 mL Syrg injection Inject the contents of 1 syringe (300 mg total) under the skin every seven (7) days. 8 mL 11   ??? EPINEPHrine (EPIPEN) 0.3 mg/0.3 mL (1:1,000) injection      ??? EPINEPHrine (EPIPEN) 0.3 mg/0.3 mL injection Inject 0.3 mL (0.3 mg total) into the muscle once. for 1 dose In case of anaphylaxis 2 each 3   ??? fluticasone (FLONASE) 50 mcg/actuation nasal spray      ??? fluticasone propion-salmeteroL (ADVAIR) 250-50 mcg/dose diskus Inhale 1 puff Two (2) times a day to prevent coughing or wheezing. Rinse, gargle and spit out after use. Due for 6 month check-up! 60 each 0   ??? predniSONE (DELTASONE) 10 MG tablet Take 1 tablet (10 mg total) by mouth daily. 30 tablet 0   ??? sulfamethoxazole-trimethoprim (BACTRIM DS) 800-160 mg per tablet Take one tablet by mouth daily 30 tablet 0   ??? tacrolimus (PROTOPIC) 0.03 % ointment Apply topically.     ??? triamcinolone (KENALOG) 0.1 % ointment Apply topically Two (2) times a day. For wet wraps, will need to 908 g quantity 908 g 3     No current facility-administered medications for this visit.        Changes to medications: Junior reports no changes at this time.    Allergies   Allergen Reactions   ??? Benadryl [Diphenhydramine Hcl] Swelling   ??? Peanut Swelling and Other (See Comments)     Face   ??? Peanut Oil Anaphylaxis     Pt has an epi pen  Pt has  an epi pen   ??? Soy Anaphylaxis   ??? Cetirizine Other (See Comments)     Other reaction(s): Unknown   ??? Cyproheptadine Other (See Comments)     Other Reaction: Other reaction   ??? Fish Containing Products Other (See Comments)     Unknown  Other reaction(s): UNKNOWN   ??? Hydroxyzine Other (See Comments)   ??? Shellfish Containing Products      Unknown   ??? Nickel Rash       Changes to allergies: No    SPECIALTY MEDICATION ADHERENCE     Dupixent 300/2 mg/ml: 2 left      Medication Adherence    Patient reported X missed doses in the last month: 0  Specialty Medication: Dupixent  Patient is on additional specialty medications: No  Support network for adherence: family member          Specialty medication(s) dose(s) confirmed: Regimen is correct and unchanged.     Are there any concerns with adherence? No    Adherence counseling provided? Not needed    CLINICAL MANAGEMENT AND INTERVENTION      Clinical Benefit Assessment:    Do you feel the medicine is effective or helping your condition? Yes    Clinical Benefit counseling provided? Not needed    Adverse Effects Assessment:    Are you experiencing any side effects? No    Are you experiencing difficulty administering your medicine? No    Quality of Life Assessment:    How many days over the past month did your AD  keep you from your normal activities? For example, brushing your teeth or getting up in the morning. 0    Have you discussed this with your provider? Not needed    Therapy Appropriateness:    Is therapy appropriate? Yes, therapy is appropriate and should be continued    DISEASE/MEDICATION-SPECIFIC INFORMATION      For patients on injectable medications: Patient currently has 2 doses left.  Next injection is scheduled for 2/9 and 2/16 (father reports they give on Mon, Tues, or Wed each week).    PATIENT SPECIFIC NEEDS     - Does the patient have any physical, cognitive, or cultural barriers? No    - Is the patient high risk? No    - Does the patient require a Care Management Plan? No     - Does the patient require physician intervention or other additional services (i.e. nutrition, smoking cessation, social work)? No      SHIPPING     Specialty Medication(s) to be Shipped:   Inflammatory Disorders: Dupixent    Other medication(s) to be shipped: No additional medications requested for fill at this time     Changes to insurance: No    Delivery Scheduled: Yes, Expected medication delivery date: 2/15.     Medication will be delivered via UPS to the confirmed prescription address in [facility].    The patient will receive a drug information handout for each medication shipped and additional FDA Medication Guides as required.  Verified that patient has previously received a Conservation officer, historic buildings.    All of the patient's questions and concerns have been addressed.    [name]   [facility] Shared [facility] Specialty Pharmacist

## 2020-08-04 NOTE — Unmapped (Signed)
Called patient to schd an appt for medication refill had to LVM    Thanks  [facility]

## 2020-08-04 NOTE — Unmapped (Signed)
Patient called stating he received a phone call from the office and was returning the call. Would like to be called back at [phone].

## 2020-08-08 MED FILL — DUPIXENT 300 MG/2 ML SUBCUTANEOUS SYRINGE: SUBCUTANEOUS | 28 days supply | Qty: 8 | Fill #4

## 2020-08-10 ENCOUNTER — Ambulatory Visit: Payer: Self-pay

## 2020-08-11 ENCOUNTER — Ambulatory Visit (INDEPENDENT_AMBULATORY_CARE_PROVIDER_SITE_OTHER): Payer: Medicaid Other | Admitting: *Deleted

## 2020-08-11 DIAGNOSIS — L209 Atopic dermatitis, unspecified: Secondary | ICD-10-CM | POA: Diagnosis not present

## 2020-08-17 ENCOUNTER — Ambulatory Visit (INDEPENDENT_AMBULATORY_CARE_PROVIDER_SITE_OTHER): Payer: Medicaid Other

## 2020-08-17 ENCOUNTER — Other Ambulatory Visit: Payer: Self-pay

## 2020-08-17 DIAGNOSIS — L209 Atopic dermatitis, unspecified: Secondary | ICD-10-CM

## 2020-08-24 ENCOUNTER — Ambulatory Visit (INDEPENDENT_AMBULATORY_CARE_PROVIDER_SITE_OTHER): Payer: Medicaid Other

## 2020-08-24 ENCOUNTER — Other Ambulatory Visit: Payer: Self-pay

## 2020-08-24 DIAGNOSIS — L209 Atopic dermatitis, unspecified: Secondary | ICD-10-CM | POA: Diagnosis not present

## 2020-08-29 NOTE — Unmapped (Signed)
[facility] Refill Coordination Note    Specialty Medication(s) to be Shipped:   Inflammatory Disorders: Dupixent    Other medication(s) to be shipped: No additional medications requested for fill at this time     Barry Bond., DOB: 03/28/2001  Phone: There are no phone numbers on file.      All above HIPAA information was verified with patient's family member, father.     Was a Nurse, learning disability used for this call? No    Completed refill call assessment today to schedule patient's medication shipment from the [facility] [phone]).       Specialty medication(s) and dose(s) confirmed: Regimen is correct and unchanged.   Changes to medications: Barry Bond reports no changes at this time.  Changes to insurance: No  Questions for the pharmacist: No    Confirmed patient received Welcome Packet with first shipment. The patient will receive a drug information handout for each medication shipped and additional FDA Medication Guides as required.       DISEASE/MEDICATION-SPECIFIC INFORMATION        For patients on injectable medications: Patient currently has 1 doses left.  Next injection is scheduled for 09/05/2020.    SPECIALTY MEDICATION ADHERENCE     Medication Adherence    Patient reported X missed doses in the last month: 0  Specialty Medication: Dupixent 300 mg/2 ml  Patient is on additional specialty medications: No  Any gaps in refill history greater than 2 weeks in the last 3 months: no  Demonstrates understanding of importance of adherence: yes  Informant: father  Reliability of informant: reliable  Support network for adherence: family member  Confirmed plan for next specialty medication refill: delivery by pharmacy  Refills needed for supportive medications: not needed                Dupixent 300/2 mg/ml: 7 days of medicine on hand          SHIPPING     Shipping address confirmed in Epic.     Delivery Scheduled: Yes, Expected medication delivery date: 09/01/2020. Medication will be delivered via UPS to the prescription address in Epic WAM.    Barry Bond Barry Bond   [facility] Shared [facility] Specialty Technician

## 2020-08-31 ENCOUNTER — Ambulatory Visit (INDEPENDENT_AMBULATORY_CARE_PROVIDER_SITE_OTHER): Payer: Medicaid Other | Admitting: *Deleted

## 2020-08-31 ENCOUNTER — Other Ambulatory Visit: Payer: Self-pay

## 2020-08-31 DIAGNOSIS — L209 Atopic dermatitis, unspecified: Secondary | ICD-10-CM | POA: Diagnosis not present

## 2020-08-31 MED FILL — DUPIXENT 300 MG/2 ML SUBCUTANEOUS SYRINGE: SUBCUTANEOUS | 28 days supply | Qty: 8 | Fill #5

## 2020-09-07 ENCOUNTER — Other Ambulatory Visit: Payer: Self-pay

## 2020-09-07 ENCOUNTER — Ambulatory Visit (INDEPENDENT_AMBULATORY_CARE_PROVIDER_SITE_OTHER): Payer: Medicaid Other | Admitting: *Deleted

## 2020-09-07 DIAGNOSIS — L209 Atopic dermatitis, unspecified: Secondary | ICD-10-CM | POA: Diagnosis not present

## 2020-09-14 ENCOUNTER — Ambulatory Visit (INDEPENDENT_AMBULATORY_CARE_PROVIDER_SITE_OTHER): Payer: Medicaid Other

## 2020-09-14 ENCOUNTER — Other Ambulatory Visit: Payer: Self-pay

## 2020-09-14 DIAGNOSIS — L209 Atopic dermatitis, unspecified: Secondary | ICD-10-CM

## 2020-09-16 NOTE — Unmapped (Signed)
Fax received requesting PA for Dupixent, PA was initiated via CMM Nijah JR. Booher (Key:??[id number])  DUPIXENT (dupilumab) 300MG /2ML pre-filled syringes  Also got this message   I called shared services to see if there is anything else we need to do on our end but they said that pt is already getting the higher dose at this time.

## 2020-09-21 ENCOUNTER — Other Ambulatory Visit: Payer: Self-pay

## 2020-09-21 ENCOUNTER — Ambulatory Visit (INDEPENDENT_AMBULATORY_CARE_PROVIDER_SITE_OTHER): Payer: Medicaid Other

## 2020-09-21 ENCOUNTER — Ambulatory Visit
Admit: 2020-09-21 | Discharge: 2020-09-22 | Payer: BLUE CROSS/BLUE SHIELD | Attending: Dermatology | Primary: Dermatology

## 2020-09-21 DIAGNOSIS — L209 Atopic dermatitis, unspecified: Secondary | ICD-10-CM | POA: Diagnosis not present

## 2020-09-21 DIAGNOSIS — L2084 Intrinsic (allergic) eczema: Principal | ICD-10-CM

## 2020-09-21 NOTE — Unmapped (Signed)
[facility] Refill Coordination Note    Specialty Medication(s) to be Shipped:   Inflammatory Disorders: Dupixent    Other medication(s) to be shipped: No additional medications requested for fill at this time     Barry Bond., DOB: Jul 05, 2000  Phone: There are no phone numbers on file.      All above HIPAA information was verified with patient's father     Was a Nurse, learning disability used for this call? No    Completed refill call assessment today to schedule patient's medication shipment from the [facility] [phone]).       Specialty medication(s) and dose(s) confirmed: Regimen is correct and unchanged.   Changes to medications: Quante reports no changes at this time.  Changes to insurance: No  Questions for the pharmacist: No    Confirmed patient received a Conservation officer, historic buildings and a Surveyor, mining with first shipment. The patient will receive a drug information handout for each medication shipped and additional FDA Medication Guides as required.       DISEASE/MEDICATION-SPECIFIC INFORMATION        For patients on injectable medications: Patient currently has 0 doses left.  Next injection is scheduled for 09/28/20.    SPECIALTY MEDICATION ADHERENCE     Medication Adherence    Patient reported X missed doses in the last month: 0  Specialty Medication: Dupixent 300mg /6ml  Patient is on additional specialty medications: No  Informant: father  Support network for adherence: family member                SHIPPING     Shipping address confirmed in Epic.     Delivery Scheduled: Yes, Expected medication delivery date: 09/27/20.     Medication will be delivered via UPS to the prescription address in Epic WAM.    Jasper Loser   [facility] Specialty Technician

## 2020-09-21 NOTE — Unmapped (Addendum)
Put triamcinolone cream or ointment on neck. Then, put a warm wet towel on top.    We recommend Systane drops for when eyes get dry.

## 2020-09-21 NOTE — Unmapped (Signed)
Dermatology Note     Assessment and Plan:      Atopic Dermatitis, severe, improving but not at treatment goal  - Has failed numerous previous therapies. Previously treated with pred w/ shown decreased bone density  -??Has had an extensive work up for immune deficiency including immunoglobulins, lymphocyte markers, mitogens, vaccine response, genetic testing, and skin biopsy all pointing to normal eczema.  - Continue??dupilumab (DUPIXENT) 300 mg/2 mL Syrg injection, every 7 days  - Continue heavy emollients, prefer cerave and vaseline.  - Continue triamcinolone (KENALOG) 0.1 % ointment; Apply topically Two (2) times a day. For wet wraps, will need to 908 g quantity Apply wet wraps after application. Instructions for occlusion provided today   - Continue clobetasoL (TEMOVATE) 0.05 % ointment; Apply topically Two (2) times a day. To thicker areas of rash, avoid face and skin folds  - Provided instructions on modified wet wraps for most affected areas    The patient was advised to call for an appointment should any new, changing, or symptomatic lesions develop.     RTC: Return in about 3 months (around 12/22/2020) for eczema. or sooner as needed   _________________________________________________________________      Chief Complaint     Chief Complaint   Patient presents with   ??? Eczema     pt here for f/u Eczema, no new concerns       HPI     Barry Bond. is a 20 y.o. male who presents as a returning patient (last seen 04/08/2020) to [facility] for follow up of atopic dermatitis. Currently on dupixent weekly. No longer on cyclosporine (did not get refill as he did not follow-up). Overall, feels much improved. Using clobetasol about 3 times weekly. Has started using moisturizer daily. His neck is the most affected area.    The patient denies any other new or changing lesions or areas of concern.     Pertinent Past Medical History     No history of skin cancer    Family History:   Negative for melanoma    Past Medical History, Family History, Social History, Medication List, Allergies, and Problem List were reviewed in the rooming section of Epic.     ROS: Other than symptoms mentioned in the HPI, no fevers, chills, or other skin complaints.    Physical Examination     GENERAL: Well-appearing male in no acute distress, resting comfortably.  NEURO: Alert and oriented, answers questions appropriately  PSYCH: Normal mood and affect  RESP: No increased work of breathing    SKIN (Waist Up Skin Exam): Per patient request, exam of the scalp, face, eyelids, lips, nose, ears, neck, chest, upper abdomen, back, arms, hands, and fingernails was performed  - Diffuse xerosis with background erythema. Scaling and lichenification with some fissuring of the posterior neck. Scattered excoriations.    All areas not commented on are within normal limits or unremarkable

## 2020-09-26 MED FILL — DUPIXENT 300 MG/2 ML SUBCUTANEOUS SYRINGE: SUBCUTANEOUS | 28 days supply | Qty: 8 | Fill #6

## 2020-09-28 ENCOUNTER — Ambulatory Visit (INDEPENDENT_AMBULATORY_CARE_PROVIDER_SITE_OTHER): Payer: Medicaid Other | Admitting: *Deleted

## 2020-09-28 ENCOUNTER — Other Ambulatory Visit: Payer: Self-pay

## 2020-09-28 DIAGNOSIS — L209 Atopic dermatitis, unspecified: Secondary | ICD-10-CM

## 2020-10-05 ENCOUNTER — Ambulatory Visit (INDEPENDENT_AMBULATORY_CARE_PROVIDER_SITE_OTHER): Payer: Medicaid Other | Admitting: *Deleted

## 2020-10-05 ENCOUNTER — Other Ambulatory Visit: Payer: Self-pay

## 2020-10-05 DIAGNOSIS — L209 Atopic dermatitis, unspecified: Secondary | ICD-10-CM

## 2020-10-12 ENCOUNTER — Ambulatory Visit (INDEPENDENT_AMBULATORY_CARE_PROVIDER_SITE_OTHER): Payer: Medicaid Other

## 2020-10-12 ENCOUNTER — Other Ambulatory Visit: Payer: Self-pay

## 2020-10-12 DIAGNOSIS — L209 Atopic dermatitis, unspecified: Secondary | ICD-10-CM | POA: Diagnosis not present

## 2020-10-19 ENCOUNTER — Ambulatory Visit (INDEPENDENT_AMBULATORY_CARE_PROVIDER_SITE_OTHER): Payer: Medicaid Other | Admitting: *Deleted

## 2020-10-19 ENCOUNTER — Other Ambulatory Visit: Payer: Self-pay

## 2020-10-19 DIAGNOSIS — L209 Atopic dermatitis, unspecified: Secondary | ICD-10-CM | POA: Diagnosis not present

## 2020-10-20 NOTE — Unmapped (Signed)
[facility] Refill Coordination Note    Specialty Medication(s) to be Shipped:   Inflammatory Disorders: Dupixent    Other medication(s) to be shipped: No additional medications requested for fill at this time     Barry Bond., DOB: 2000/09/30  Phone: There are no phone numbers on file.      All above HIPAA information was verified with patient's family member, father.     Was a Nurse, learning disability used for this call? No    Completed refill call assessment today to schedule patient's medication shipment from the [facility] [phone]).  All relevant notes have been reviewed.     Specialty medication(s) and dose(s) confirmed: Regimen is correct and unchanged.   Changes to medications: Ariana reports no changes at this time.  Changes to insurance: No  New side effects reported not previously addressed with a pharmacist or physician: None reported  Questions for the pharmacist: No    Confirmed patient received a Conservation officer, historic buildings and a Surveyor, mining with first shipment. The patient will receive a drug information handout for each medication shipped and additional FDA Medication Guides as required.       DISEASE/MEDICATION-SPECIFIC INFORMATION        For patients on injectable medications: Patient currently has 1 doses left.  Next injection is scheduled for 10/20/2020.    SPECIALTY MEDICATION ADHERENCE     Medication Adherence    Patient reported X missed doses in the last month: 0  Specialty Medication: Dupixent 300 mg/2 ml  Patient is on additional specialty medications: No  Any gaps in refill history greater than 2 weeks in the last 3 months: no  Demonstrates understanding of importance of adherence: yes  Informant: father  Reliability of informant: reliable  Support network for adherence: family member  Confirmed plan for next specialty medication refill: delivery by pharmacy  Refills needed for supportive medications: not needed              Were doses missed due to medication being on hold? No    Dupixent 300/2 mg/ml: 7 days of medicine on hand       REFERRAL TO PHARMACIST     Referral to the pharmacist: Not needed      [facility]     Shipping address confirmed in Epic.     Delivery Scheduled: Yes, Expected medication delivery date: 10/25/2020.     Medication will be delivered via UPS to the prescription address in Epic WAM.    Leory Allinson D Elisia Stepp   [facility] Specialty Technician

## 2020-10-21 DIAGNOSIS — L209 Atopic dermatitis, unspecified: Principal | ICD-10-CM

## 2020-10-24 MED FILL — DUPIXENT 300 MG/2 ML SUBCUTANEOUS SYRINGE: SUBCUTANEOUS | 28 days supply | Qty: 8 | Fill #7

## 2020-10-26 ENCOUNTER — Other Ambulatory Visit: Payer: Self-pay

## 2020-10-26 ENCOUNTER — Ambulatory Visit (INDEPENDENT_AMBULATORY_CARE_PROVIDER_SITE_OTHER): Payer: Medicaid Other

## 2020-10-26 DIAGNOSIS — L209 Atopic dermatitis, unspecified: Secondary | ICD-10-CM

## 2020-11-02 ENCOUNTER — Other Ambulatory Visit: Payer: Self-pay

## 2020-11-02 ENCOUNTER — Ambulatory Visit (INDEPENDENT_AMBULATORY_CARE_PROVIDER_SITE_OTHER): Payer: Medicaid Other

## 2020-11-02 DIAGNOSIS — L209 Atopic dermatitis, unspecified: Secondary | ICD-10-CM

## 2020-11-04 ENCOUNTER — Ambulatory Visit: Payer: Self-pay

## 2020-11-10 ENCOUNTER — Ambulatory Visit (INDEPENDENT_AMBULATORY_CARE_PROVIDER_SITE_OTHER): Payer: Medicaid Other

## 2020-11-10 ENCOUNTER — Other Ambulatory Visit: Payer: Self-pay

## 2020-11-10 DIAGNOSIS — L209 Atopic dermatitis, unspecified: Secondary | ICD-10-CM | POA: Diagnosis not present

## 2020-11-15 NOTE — Unmapped (Signed)
[facility] Refill Coordination Note    Specialty Medication(s) to be Shipped:   Inflammatory Disorders: Dupixent    Other medication(s) to be shipped: No additional medications requested for fill at this time     Barry Bond., DOB: [birth year]  Phone: There are no phone numbers on file.      All above HIPAA information was verified with patient's family member, dad.     Was a Nurse, learning disability used for this call? No    Completed refill call assessment today to schedule patient's medication shipment from the [facility] [phone]).  All relevant notes have been reviewed.     Specialty medication(s) and dose(s) confirmed: Regimen is correct and unchanged.   Changes to medications: Jerl reports no changes at this time.  Changes to insurance: No  New side effects reported not previously addressed with a pharmacist or physician: None reported  Questions for the pharmacist: No    Confirmed patient received a Conservation officer, historic buildings and a Surveyor, mining with first shipment. The patient will receive a drug information handout for each medication shipped and additional FDA Medication Guides as required.       DISEASE/MEDICATION-SPECIFIC INFORMATION        For patients on injectable medications: Patient currently has 1 doses left.  Next injection is scheduled for 05/26.    SPECIALTY MEDICATION ADHERENCE     Medication Adherence    Patient reported X missed doses in the last month: 0  Specialty Medication: dupixent 300mg /44ml  Patient is on additional specialty medications: No  Patient is on more than two specialty medications: No  Any gaps in refill history greater than 2 weeks in the last 3 months: no  Demonstrates understanding of importance of adherence: yes  Informant: father  Reliability of informant: reliable  Provider-estimated medication adherence level: good  Patient is at risk for Non-Adherence: No  Support network for adherence: family member              Were doses missed due to medication being on hold? No     dupixent 300/2 mg/ml: 7 days of medicine on hand     REFERRAL TO PHARMACIST     Referral to the pharmacist: Not needed      [facility]     Shipping address confirmed in Epic.     Delivery Scheduled: Yes, Expected medication delivery date: 05/27.     Medication will be delivered via UPS to the prescription address in Epic WAM.    [name]   [facility] Specialty Technician

## 2020-11-17 ENCOUNTER — Ambulatory Visit (INDEPENDENT_AMBULATORY_CARE_PROVIDER_SITE_OTHER): Payer: Medicaid Other

## 2020-11-17 ENCOUNTER — Other Ambulatory Visit: Payer: Self-pay

## 2020-11-17 DIAGNOSIS — L209 Atopic dermatitis, unspecified: Secondary | ICD-10-CM

## 2020-11-17 MED FILL — DUPIXENT 300 MG/2 ML SUBCUTANEOUS SYRINGE: SUBCUTANEOUS | 28 days supply | Qty: 8 | Fill #8

## 2020-11-25 ENCOUNTER — Other Ambulatory Visit: Payer: Self-pay

## 2020-11-25 ENCOUNTER — Ambulatory Visit (INDEPENDENT_AMBULATORY_CARE_PROVIDER_SITE_OTHER): Payer: Medicaid Other

## 2020-11-25 DIAGNOSIS — L209 Atopic dermatitis, unspecified: Secondary | ICD-10-CM | POA: Diagnosis not present

## 2020-12-01 ENCOUNTER — Ambulatory Visit (INDEPENDENT_AMBULATORY_CARE_PROVIDER_SITE_OTHER): Payer: Medicaid Other

## 2020-12-01 DIAGNOSIS — L209 Atopic dermatitis, unspecified: Secondary | ICD-10-CM

## 2020-12-07 ENCOUNTER — Ambulatory Visit: Payer: Medicaid Other | Admitting: Family Medicine

## 2020-12-07 ENCOUNTER — Encounter: Payer: Self-pay | Admitting: Family Medicine

## 2020-12-07 ENCOUNTER — Ambulatory Visit (INDEPENDENT_AMBULATORY_CARE_PROVIDER_SITE_OTHER): Payer: Medicaid Other

## 2020-12-07 ENCOUNTER — Other Ambulatory Visit: Payer: Self-pay

## 2020-12-07 VITALS — BP 114/80 | HR 82 | Temp 98.0°F | Resp 18 | Ht 66.25 in | Wt 193.6 lb

## 2020-12-07 DIAGNOSIS — J301 Allergic rhinitis due to pollen: Secondary | ICD-10-CM | POA: Diagnosis not present

## 2020-12-07 DIAGNOSIS — J4540 Moderate persistent asthma, uncomplicated: Secondary | ICD-10-CM

## 2020-12-07 DIAGNOSIS — L209 Atopic dermatitis, unspecified: Secondary | ICD-10-CM | POA: Diagnosis not present

## 2020-12-07 DIAGNOSIS — L2084 Intrinsic (allergic) eczema: Secondary | ICD-10-CM

## 2020-12-07 DIAGNOSIS — T7800XD Anaphylactic reaction due to unspecified food, subsequent encounter: Secondary | ICD-10-CM

## 2020-12-07 MED ORDER — CETIRIZINE 10 MG TABLET
0.00000 days
Start: 2020-12-07 — End: ?

## 2020-12-07 MED ORDER — ALBUTEROL SULFATE HFA 108 (90 BASE) MCG/ACT IN AERS: 2.0000 | INHALATION_SPRAY | RESPIRATORY_TRACT | 6 refills | Status: DC | PRN

## 2020-12-07 MED ORDER — ALBUTEROL SULFATE (2.5 MG/3ML) 0.083% IN NEBU: INHALATION_SOLUTION | RESPIRATORY_TRACT | 1 refills | Status: DC

## 2020-12-07 MED ORDER — CETIRIZINE HCL 10 MG PO TABS: ORAL_TABLET | ORAL | 5 refills | Status: DC

## 2020-12-07 MED ORDER — FLUTICASONE-SALMETEROL 250-50 MCG/ACT IN AEPB: 1.0000 | INHALATION_SPRAY | Freq: Two times a day (BID) | RESPIRATORY_TRACT | 6 refills | Status: DC

## 2020-12-07 NOTE — Progress Notes (Signed)
[address] [place] [address] Dept: [phone]  FOLLOW UP NOTE  Patient ID: Travis Morrison, male    DOB: 21-Nov-2000  Age: 20 y.o. MRN: [id number] Date of Office Visit: 12/07/2020  Assessment  Chief Complaint: Follow-up (Bleeding in the mouth, slight to no improvement in asthma and allergies symptoms no major changes.)  HPI Travis Morrison is a 20 year old male who presents to the clinic for follow-up visit.  He was last seen in this clinic on 04/25/2020 for evaluation of asthma, allergic rhinitis, atopic dermatitis, and food allergy to fish, shellfish, peanut, and lentil.  He is accompanied by his father who assists with history.  At today's visit, he reports his asthma has been moderately well controlled with " a little bit of struggling over spring season" which has improved at this time.  He reports that, over spring season he had some shortness of breath, wheeze and occasional dry cough.  At today's visit, he reports his asthma has been much more well controlled with no shortness of breath occasional wheeze if he stays outside for long periods of time and dry cough about 1 time per week.  He continues Advair discus 251 puff twice a day and albuterol about once a week.  Allergic rhinitis is reported as well controlled with no rhinorrhea, nasal congestion, or sneeze.  He continues cetirizine 10 mg once a day and has not currently using Flonase or nasal saline rinses.  Atopic dermatitis is reported as much more controlled with continued daily moisturization, topical creams, and dupilumab 300 mg once every week.  He continues to follow with dermatology.  He continues to avoid fish, shellfish, peanut, and lentil with no accidental ingestion or EpiPen use since his last visit to this clinic.  His current medications are listed in the chart.   Drug Allergies:  Allergies  Allergen Reactions   Cyproheptadine Other (See Comments)    Other Reaction: Other reaction Other Reaction: Other  reaction    Diphenhydramine Hcl Swelling   Peanuts [Peanut Oil] Anaphylaxis    Pt has an epi pen   Fish Allergy     Unknown   Hydroxyzine Other (See Comments)   Lentil    Shellfish Allergy Cough   Cephalexin Rash   Nickel Other (See Comments) and Rash    Other Reaction: Not Assessed     Physical Exam: BP 114/80 (BP Location: Right Arm, Patient Position: Sitting, Cuff Size: Normal)   Pulse 82   Temp 98 F (36.7 C) (Temporal)   Resp 18   Ht 5' 6.25" (1.683 m)   Wt 193 lb 9.6 oz (87.8 kg)   SpO2 99%   BMI 31.01 kg/m    Physical Exam Vitals reviewed.  Constitutional:      Appearance: Normal appearance.  HENT:     Head: Normocephalic and atraumatic.     Right Ear: Tympanic membrane normal.     Left Ear: Tympanic membrane normal.     Nose:     Comments: Bilateral nares slightly erythematous with clear nasal drainage noted.  Pharynx normal.  Ears normal.  Eyes normal.    Mouth/Throat:     Pharynx: Oropharynx is clear.  Eyes:     Conjunctiva/sclera: Conjunctivae normal.  Cardiovascular:     Rate and Rhythm: Normal rate and regular rhythm.     Heart sounds: Normal heart sounds. No murmur heard. Pulmonary:     Effort: Pulmonary effort is normal.     Breath sounds: Normal breath sounds.  Comments: Lungs clear to auscultation Musculoskeletal:        General: Normal range of motion.     Cervical back: Normal range of motion and neck supple.  Skin:    General: Skin is warm.     Comments: Scattered dry eczematous patches noted over his body.  No open areas or drainage noted  Neurological:     Mental Status: He is alert and oriented to person, place, and time.  Psychiatric:        Mood and Affect: Mood normal.        Behavior: Behavior normal.        Thought Content: Thought content normal.        Judgment: Judgment normal.    Diagnostics: FVC 3.34, FEV1 3.27.  Predicted FVC 4.43, predicted FEV1 3.  8 2.  Spirometry indicates mild restriction.  This is consistent  with previous spirometry readings.  Assessment and Plan: 1. Moderate persistent asthma without complication   2. Intrinsic atopic dermatitis   3. Anaphylactic shock due to food, subsequent encounter   4. Seasonal allergic rhinitis due to pollen     Meds ordered this encounter  Medications   fluticasone-salmeterol (ADVAIR DISKUS) 250-50 MCG/ACT AEPB    Sig: Inhale 1 puff into the lungs in the morning and at bedtime.    Dispense:  60 each    Refill:  6   cetirizine (ZYRTEC) 10 MG tablet    Sig: 1 tablet once a day if needed for runny nose or itchy eyes    Dispense:  34 tablet    Refill:  5   albuterol (VENTOLIN HFA) 108 (90 Base) MCG/ACT inhaler    Sig: Inhale 2 puffs into the lungs every 4 (four) hours as needed.    Dispense:  18 g    Refill:  6   albuterol (PROVENTIL) (2.5 MG/3ML) 0.083% nebulizer solution    Sig: 1 vial in nebulizer every 4 hours as needed for coughing or wheezing.    Dispense:  75 mL    Refill:  1     Patient Instructions  Asthma Continue Advair Diskus 250 mg-take 1 puff every 12 hours to prevent coughing or wheezing Continue albuterol 2 puffs every 4 hours if needed for wheezing or coughing spells or instead albuterol 0.083% 1 unit dose every 4 hours if needed. Try not to use this medication on a regular schedule if you are not coughing, wheezing or short of breath Use albuterol 2 puffs 5-15 minutes before exercise to decrease cough or wheeze  Allergic rhinitis Continue fluticasone 2 sprays per nostril once a day if needed for stuffy nose Continue saline nasal rinses as needed for nasal symptoms. Use this before any medicated nasal sprays for best result Continue cetirizine 10 mg-take 1 tablet once a day only if needed for runny nose for itching For thick post nasal drainage, begin Mucinex [phone] mg twice a day for and increase hydration as tolerated until your nasal symptoms are resolved  Atopic dermatitis Continue on the treatment of your eczema  prescribed by his dermatologist Continue dupilumab 300 mg once every week and have access to an epinephrine auto-injector set  Food allergy Continue to avoid fish, shellfish, peanut and lentils.  If he has an allergic reaction give Benadryl 50 mg every 4 hours and if he has life-threatening symptoms inject with EpiPen 0.3 mg  Continue the other medications as listed in the chart  Call the clinic if this treatment plan is not working well  for you  Follow up in 6 months or sooner if needed  Return in about 6 months (around 06/08/2021), or if symptoms worsen or fail to improve.    Thank you for the opportunity to care for this patient.  Please do not hesitate to contact me with questions.  Thermon Leyland, FNP [facility]

## 2020-12-07 NOTE — Patient Instructions (Signed)
Asthma Continue Advair Diskus 250 mg-take 1 puff every 12 hours to prevent coughing or wheezing Continue albuterol 2 puffs every 4 hours if needed for wheezing or coughing spells or instead albuterol 0.083% 1 unit dose every 4 hours if needed. Try not to use this medication on a regular schedule if you are not coughing, wheezing or short of breath Use albuterol 2 puffs 5-15 minutes before exercise to decrease cough or wheeze  Allergic rhinitis Continue fluticasone 2 sprays per nostril once a day if needed for stuffy nose Continue saline nasal rinses as needed for nasal symptoms. Use this before any medicated nasal sprays for best result Continue cetirizine 10 mg-take 1 tablet once a day only if needed for runny nose for itching For thick post nasal drainage, begin Mucinex [phone] mg twice a day for and increase hydration as tolerated until your nasal symptoms are resolved  Atopic dermatitis Continue on the treatment of your eczema prescribed by his dermatologist Continue dupilumab 300 mg once every week and have access to an epinephrine auto-injector set  Food allergy Continue to avoid fish, shellfish, peanut and lentils.  If he has an allergic reaction give Benadryl 50 mg every 4 hours and if he has life-threatening symptoms inject with EpiPen 0.3 mg  Continue the other medications as listed in the chart  Call the clinic if this treatment plan is not working well for you  Follow up in 6 months or sooner if needed

## 2020-12-08 ENCOUNTER — Ambulatory Visit: Payer: Self-pay

## 2020-12-08 NOTE — Unmapped (Signed)
[facility] Refill Coordination Note    Specialty Medication(s) to be Shipped:   Inflammatory Disorders: Dupixent    Other medication(s) to be shipped: No additional medications requested for fill at this time     Barry Bond., DOB: [birth year]  Phone: There are no phone numbers on file.      All above HIPAA information was verified with patient's family member, dad.     Was a Nurse, learning disability used for this call? No    Completed refill call assessment today to schedule patient's medication shipment from the [facility] [phone]).  All relevant notes have been reviewed.     Specialty medication(s) and dose(s) confirmed: Regimen is correct and unchanged.   Changes to medications: [name] reports no changes at this time.  Changes to insurance: No  New side effects reported not previously addressed with a pharmacist or physician: None reported  Questions for the pharmacist: No    Confirmed patient received a Conservation officer, historic buildings and a Surveyor, mining with first shipment. The patient will receive a drug information handout for each medication shipped and additional FDA Medication Guides as required.       DISEASE/MEDICATION-SPECIFIC INFORMATION        For patients on injectable medications: Patient currently has 2 doses left.  Next injection is scheduled for 12/08/2020.    SPECIALTY MEDICATION ADHERENCE     Medication Adherence    Patient reported X missed doses in the last month: 0  Specialty Medication: Dupixent 300 mg/2 ml  Patient is on additional specialty medications: No  Any gaps in refill history greater than 2 weeks in the last 3 months: no  Demonstrates understanding of importance of adherence: yes  Informant: father  Reliability of informant: reliable  Support network for adherence: family member  Confirmed plan for next specialty medication refill: delivery by pharmacy  Refills needed for supportive medications: not needed              Were doses missed due to medication being on hold? No     dupixent 300/2 mg/ml: 14 days of medicine on hand     REFERRAL TO PHARMACIST     Referral to the pharmacist: Not needed      [facility]     Shipping address confirmed in Epic.     Delivery Scheduled: Yes, Expected medication delivery date: 12/15/2020.     Medication will be delivered via UPS to the prescription address in Epic WAM.    [name] [name]   [facility] Specialty Technician

## 2020-12-14 MED FILL — DUPIXENT 300 MG/2 ML SUBCUTANEOUS SYRINGE: SUBCUTANEOUS | 28 days supply | Qty: 8 | Fill #9

## 2020-12-15 ENCOUNTER — Other Ambulatory Visit: Payer: Self-pay

## 2020-12-15 ENCOUNTER — Ambulatory Visit (INDEPENDENT_AMBULATORY_CARE_PROVIDER_SITE_OTHER): Payer: Medicaid Other

## 2020-12-15 DIAGNOSIS — L209 Atopic dermatitis, unspecified: Secondary | ICD-10-CM

## 2020-12-15 NOTE — Unmapped (Incomplete)
For atopic dermatitis:   - Continue injecting the contents of dupixent syringe every 7 days  - Continue applying triamcinolone ointment topically twice a day.   - Continue applying clobetasol ointment twice a day to thicker areas of the rash. Avoid the face and skin folds.

## 2020-12-15 NOTE — Unmapped (Unsigned)
Dermatology Note     Assessment and Plan:      Atopic Dermatitis,??severe, improving but not at treatment goal:  - Has failed numerous previous therapies. Previously treated with pred w/ shown decreased bone density  -??Has had an extensive work up for immune deficiency including immunoglobulins, lymphocyte markers, mitogens, vaccine response, genetic testing, and skin biopsy all pointing to normal eczema.  -??Continue??dupilumab (DUPIXENT) 300 mg/2 mL Syrg injection, every 7 days  - Continue??heavy??emollients, prefer cerave and vaseline.  - Continue??triamcinolone (KENALOG) 0.1 % ointment; Apply topically Two (2) times a day. For wet wraps, will need to 908 g quantity??Apply wet wraps after application. Instructions for occlusion provided today??  -??Continue??clobetasoL (TEMOVATE) 0.05 % ointment; Apply topically Two (2) times a day. To thicker areas of rash, avoid face and skin folds  - Discussed switching to Rinvoq if Dupixent loses efficacy     The patient was advised to call for an appointment should any new, changing, or symptomatic lesions develop.     RTC: Return in about 3 months (around 03/18/2021) for follow-up of eczema. or sooner as needed   _________________________________________________________________      Chief Complaint     Follow-up of eczema    HPI     Barry Bond. is a 20 y.o. male who presents as a returning patient (last seen by Dr. Alvina Filbert and Dr. Izora Ribas on 09/21/2020) to [facility] for follow up of eczema. History was provided jointly with his father today. At last visit, patient was continued on dupilumab 300 mg/2 mL injections, triamcinolone 0.1% ointment, and clobetasol 0.05% ointment for atopic dermatitis.     Today patient reports overall improvement in his eczema. He is routinely using the prescribed medications. He notes occassionally flaring on his neck area.     The patient denies any other new or changing lesions or areas of concern.     Pertinent Past Medical History     No history of skin cancer    Family History:   Negative for melanoma    Past Medical History, Family History, Social History, Medication List, Allergies, and Problem List were reviewed in the rooming section of Epic.     ROS: Other than symptoms mentioned in the HPI, no fevers, chills, or other skin complaints    Physical Examination     GENERAL: Well-appearing male in no acute distress, resting comfortably.  NEURO: Alert and oriented, answers questions appropriately  PSYCH: Normal mood and affect  SKIN: Examination of the face, trunk, and extremities was performed  - Thin erythematous plaques diffusely on the face, trunk, and extremities with mild 20% BSA   - Some ectropion present     All areas not commented on are within normal limits or unremarkable    Scribe's Attestation: Barry Heritage, MD obtained and performed the history, physical exam and medical decision making elements that were entered into the chart. Signed by Barry Bond, Scribe, on December 16, 2020 at 9:13 AM.     {*** NOTE TO PROVIDER: PLEASE ADD ATTESTATION NOTING YOU AGREE WITH SCRIBE DOCUMENTATION}     (Approved Template 03/07/2020)

## 2020-12-16 ENCOUNTER — Ambulatory Visit
Admit: 2020-12-16 | Discharge: 2020-12-17 | Payer: BLUE CROSS/BLUE SHIELD | Attending: Dermatology | Primary: Dermatology

## 2020-12-21 ENCOUNTER — Ambulatory Visit: Payer: Self-pay

## 2020-12-22 ENCOUNTER — Other Ambulatory Visit: Payer: Self-pay

## 2020-12-22 ENCOUNTER — Ambulatory Visit (INDEPENDENT_AMBULATORY_CARE_PROVIDER_SITE_OTHER): Payer: Medicaid Other

## 2020-12-22 DIAGNOSIS — L209 Atopic dermatitis, unspecified: Secondary | ICD-10-CM | POA: Diagnosis not present

## 2020-12-29 ENCOUNTER — Other Ambulatory Visit: Payer: Self-pay

## 2020-12-29 ENCOUNTER — Ambulatory Visit (INDEPENDENT_AMBULATORY_CARE_PROVIDER_SITE_OTHER): Payer: Medicaid Other

## 2020-12-29 DIAGNOSIS — L209 Atopic dermatitis, unspecified: Secondary | ICD-10-CM | POA: Diagnosis not present

## 2021-01-05 ENCOUNTER — Other Ambulatory Visit: Payer: Self-pay

## 2021-01-05 ENCOUNTER — Ambulatory Visit (INDEPENDENT_AMBULATORY_CARE_PROVIDER_SITE_OTHER): Payer: Medicaid Other

## 2021-01-05 DIAGNOSIS — L209 Atopic dermatitis, unspecified: Secondary | ICD-10-CM

## 2021-01-10 NOTE — Unmapped (Signed)
Barry Bond's father reports he continues to do well on Dupixent, with only some mild flaring on his neck. He had a recent visit, and the plan is to continue for now. They may consider Rinvoq in the future if he loses efficacy.    [place] [facility] Clinical Assessment & Refill Coordination Note    Barry Lema., DOB: January 20, 2001  Phone: There are no phone numbers on file.    All above HIPAA information was verified with patient's family member, father, Barry Bond.     Was a Nurse, learning disability used for this call? No    Specialty Medication(s):   Inflammatory Disorders: Dupixent     Current Outpatient Medications   Medication Sig Dispense Refill   ??? albuterol 2.5 mg /3 mL (0.083 %) nebulizer solution Use as instructed 6 mL 0   ??? albuterol HFA 90 mcg/actuation inhaler Inhale 1 puff every six (6) hours as needed. 18 g 2   ??? beclomethasone (QVAR) 80 mcg/actuation inhaler Inhale 2 puffs Two (2) times a day.      ??? calcium carbonate-vitamin D3 600 mg(1,500mg ) -800 unit Tab TAKE 1 TABLET BY MOUTH EVERY DAY 90 tablet 3   ??? cetirizine (ZYRTEC) 10 MG tablet 1 tablet once a day if needed for runny nose or itchy eyes     ??? clobetasoL (TEMOVATE) 0.05 % ointment Apply topically Two (2) times a day. 120 g 11   ??? clobetasoL (TEMOVATE) 0.05 % ointment Apply topically Two (2) times a day. To thicker areas of rash, avoid face and skin folds 120 g 3   ??? cycloSPORINE modified (NEORAL) 100 MG capsule Take 2 capsules (200 mg total) by mouth Two (2) times a day. 120 capsule 2   ??? dupilumab (DUPIXENT SYRINGE) 300 mg/2 mL Syrg injection Inject the contents of 1 syringe (300 mg total) under the skin every seven (7) days. 8 mL 11   ??? EPINEPHrine (EPIPEN) 0.3 mg/0.3 mL (1:1,000) injection      ??? EPINEPHrine (EPIPEN) 0.3 mg/0.3 mL injection Inject 0.3 mL (0.3 mg total) into the muscle once. for 1 dose In case of anaphylaxis 2 each 3   ??? fluticasone (FLONASE) 50 mcg/actuation nasal spray      ??? fluticasone propion-salmeteroL (ADVAIR) 250-50 mcg/dose diskus Inhale 1 puff Two (2) times a day to prevent coughing or wheezing. Rinse, gargle and spit out after use. Due for 6 month check-up! 60 each 0   ??? predniSONE (DELTASONE) 10 MG tablet Take 1 tablet (10 mg total) by mouth daily. 30 tablet 0   ??? sulfamethoxazole-trimethoprim (BACTRIM DS) 800-160 mg per tablet Take one tablet by mouth daily 30 tablet 0   ??? tacrolimus (PROTOPIC) 0.03 % ointment Apply topically.     ??? triamcinolone (KENALOG) 0.1 % ointment Apply topically Two (2) times a day. For wet wraps, will need to 908 g quantity 908 g 3     No current facility-administered medications for this visit.        Changes to medications: Barry Bond reports no changes at this time.    Allergies   Allergen Reactions   ??? Benadryl [Diphenhydramine Hcl] Swelling   ??? Peanut Swelling and Other (See Comments)     Face   ??? Peanut Oil Anaphylaxis     Pt has an epi pen  Pt has an epi pen   ??? Soy Anaphylaxis   ??? Cetirizine Other (See Comments)     Other reaction(s): Unknown   ??? Cyproheptadine Other (See Comments)  Other Reaction: Other reaction   ??? Fish Containing Products Other (See Comments)     Unknown  Other reaction(s): UNKNOWN   ??? Hydroxyzine Other (See Comments)   ??? Shellfish Containing Products      Unknown   ??? Nickel Rash       Changes to allergies: No    SPECIALTY MEDICATION ADHERENCE     Dupixent - unclear, dad was able to confirm at least one dose for this week. He declined a shipment until he could check on Thursday, and requested a call back later.     Medication Adherence    Patient reported X missed doses in the last month: 0  Specialty Medication: Dupixent  Support network for adherence: family member          Specialty medication(s) dose(s) confirmed: Regimen is correct and unchanged.     Are there any concerns with adherence? No    Adherence counseling provided? Not needed    CLINICAL MANAGEMENT AND INTERVENTION      Clinical Benefit Assessment:    Do you feel the medicine is effective or helping your condition? Yes    Clinical Benefit counseling provided? Not needed    Adverse Effects Assessment:    Are you experiencing any side effects? No    Are you experiencing difficulty administering your medicine? No    Quality of Life Assessment:    Quality of Life    Rheumatology  On a scale of 1 - 10 with 1 representing not at all and 10 representing completely - how has your rheumatologic condition affected your:  Oncology  Dermatology  1.On a scale of 1-10, how itchy, sore, painful or stinging has your skin condition been within the last week?: 6  2.On a scale of 1-10, how embarrassed or self-conscious have you felt because of your skin within the last week?: 1  3.On a scale of 1-10, how much has your skin affected your social or leisure activities within the last week?: 1          Have you discussed this with your provider? Yes    Acute Infection Status:    Acute infections noted within Epic:  No active infections  Patient reported infection: None    Therapy Appropriateness:    Is therapy appropriate? Yes, therapy is appropriate and should be continued    DISEASE/MEDICATION-SPECIFIC INFORMATION      For patients on injectable medications: Patient currently has unclear (at least 1, per our notes, only 1 but dad requested time to confirm and wanted [place] to call back later) doses left.  Next injection is scheduled for Thurs, 7/21.    PATIENT SPECIFIC NEEDS     - Does the patient have any physical, cognitive, or cultural barriers? No    - Is the patient high risk? No    - Does the patient require a Care Management Plan? No     - Does the patient require physician intervention or other additional services (i.e. nutrition, smoking cessation, social work)? No      SHIPPING     Specialty Medication(s) to be Shipped:   na    Other medication(s) to be shipped: No additional medications requested for fill at this time     Changes to insurance: No    Delivery Scheduled: Patient declined refill at this time due to wanted to check on supply, and asked for call back.     Medication will be delivered via na to the confirmed na address  in [place].    The patient will receive a drug information handout for each medication shipped and additional FDA Medication Guides as required.  Verified that patient has previously received a Conservation officer, historic buildings and a Surveyor, mining.    The patient or caregiver noted above participated in the development of this care plan and knows that they can request review of or adjustments to the care plan at any time.      All of the patient's questions and concerns have been addressed.    [name]   [facility] Specialty Pharmacist

## 2021-01-12 ENCOUNTER — Ambulatory Visit (INDEPENDENT_AMBULATORY_CARE_PROVIDER_SITE_OTHER): Payer: Medicaid Other

## 2021-01-12 ENCOUNTER — Other Ambulatory Visit: Payer: Self-pay

## 2021-01-12 DIAGNOSIS — L209 Atopic dermatitis, unspecified: Secondary | ICD-10-CM

## 2021-01-12 NOTE — Unmapped (Signed)
[facility] Refill Coordination Note    Specialty Medication(s) to be Shipped:   Inflammatory Disorders: Dupixent    Other medication(s) to be shipped: No additional medications requested for fill at this time     Barry Eisenberg., DOB: 2001/02/18  Phone: There are no phone numbers on file.      All above HIPAA information was verified with patient's family member, dad.     Was a Nurse, learning disability used for this call? No    Completed refill call assessment today to schedule patient's medication shipment from the [facility] [phone]).  All relevant notes have been reviewed.     Specialty medication(s) and dose(s) confirmed: Regimen is correct and unchanged.   Changes to medications: Barry Bond reports no changes at this time.  Changes to insurance: No  New side effects reported not previously addressed with a pharmacist or physician: None reported  Questions for the pharmacist: No    Confirmed patient received a Conservation officer, historic buildings and a Surveyor, mining with first shipment. The patient will receive a drug information handout for each medication shipped and additional FDA Medication Guides as required.       DISEASE/MEDICATION-SPECIFIC INFORMATION        For patients on injectable medications: Patient currently has 2 doses left.  Next injection is scheduled for 01/12/2021.    SPECIALTY MEDICATION ADHERENCE     Medication Adherence    Patient reported X missed doses in the last month: 0  Specialty Medication: Dupixent 300 mg/2 ml  Patient is on additional specialty medications: No  Any gaps in refill history greater than 2 weeks in the last 3 months: no  Demonstrates understanding of importance of adherence: yes  Informant: father  Reliability of informant: reliable  Support network for adherence: family member  Confirmed plan for next specialty medication refill: delivery by pharmacy  Refills needed for supportive medications: not needed              Were doses missed due to medication being on hold? No     dupixent 300/2 mg/ml: 14 days of medicine on hand     REFERRAL TO PHARMACIST     Referral to the pharmacist: Not needed      [facility]     Shipping address confirmed in Epic.     Delivery Scheduled: Yes, Expected medication delivery date: 01/18/2021.     Medication will be delivered via UPS to the prescription address in Epic WAM.    Barry Bond   [facility] Specialty Technician

## 2021-01-17 MED FILL — DUPIXENT 300 MG/2 ML SUBCUTANEOUS SYRINGE: SUBCUTANEOUS | 28 days supply | Qty: 8 | Fill #10

## 2021-01-19 ENCOUNTER — Other Ambulatory Visit: Payer: Self-pay

## 2021-01-19 ENCOUNTER — Ambulatory Visit (INDEPENDENT_AMBULATORY_CARE_PROVIDER_SITE_OTHER): Payer: Medicaid Other

## 2021-01-19 DIAGNOSIS — L209 Atopic dermatitis, unspecified: Secondary | ICD-10-CM

## 2021-01-25 ENCOUNTER — Ambulatory Visit: Payer: Medicaid Other

## 2021-01-26 ENCOUNTER — Ambulatory Visit (INDEPENDENT_AMBULATORY_CARE_PROVIDER_SITE_OTHER): Payer: Medicaid Other

## 2021-01-26 ENCOUNTER — Other Ambulatory Visit: Payer: Self-pay

## 2021-01-26 DIAGNOSIS — L209 Atopic dermatitis, unspecified: Secondary | ICD-10-CM | POA: Diagnosis not present

## 2021-02-02 ENCOUNTER — Other Ambulatory Visit: Payer: Self-pay

## 2021-02-02 ENCOUNTER — Ambulatory Visit (INDEPENDENT_AMBULATORY_CARE_PROVIDER_SITE_OTHER): Payer: Medicaid Other

## 2021-02-02 DIAGNOSIS — L209 Atopic dermatitis, unspecified: Secondary | ICD-10-CM

## 2021-02-10 ENCOUNTER — Ambulatory Visit (INDEPENDENT_AMBULATORY_CARE_PROVIDER_SITE_OTHER): Payer: Medicaid Other | Admitting: *Deleted

## 2021-02-10 ENCOUNTER — Other Ambulatory Visit: Payer: Self-pay

## 2021-02-10 DIAGNOSIS — L209 Atopic dermatitis, unspecified: Secondary | ICD-10-CM

## 2021-02-14 NOTE — Unmapped (Signed)
[facility] Refill Coordination Note    Specialty Medication(s) to be Shipped:   Inflammatory Disorders: Dupixent    Other medication(s) to be shipped: No additional medications requested for fill at this time     Barry Bond., DOB: 2000/10/31  Phone: There are no phone numbers on file.      All above HIPAA information was verified with patient's family member, dad.     Was a Nurse, learning disability used for this call? No    Completed refill call assessment today to schedule patient's medication shipment from the [facility] [phone]).  All relevant notes have been reviewed.     Specialty medication(s) and dose(s) confirmed: Regimen is correct and unchanged.   Changes to medications: Bartt reports no changes at this time.  Changes to insurance: No  New side effects reported not previously addressed with a pharmacist or physician: None reported  Questions for the pharmacist: No    Confirmed patient received a Conservation officer, historic buildings and a Surveyor, mining with first shipment. The patient will receive a drug information handout for each medication shipped and additional FDA Medication Guides as required.       DISEASE/MEDICATION-SPECIFIC INFORMATION        For patients on injectable medications: Patient currently has 1 doses left.  Next injection is scheduled for 02/16/2021.    SPECIALTY MEDICATION ADHERENCE     Medication Adherence    Patient reported X missed doses in the last month: 0  Specialty Medication: Dupixent 300 mg/2 ml  Patient is on additional specialty medications: No  Any gaps in refill history greater than 2 weeks in the last 3 months: no  Demonstrates understanding of importance of adherence: yes  Informant: father  Reliability of informant: reliable  Support network for adherence: family member  Confirmed plan for next specialty medication refill: delivery by pharmacy  Refills needed for supportive medications: not needed              Were doses missed due to medication being on hold? No     dupixent 300/2 mg/ml: 7 days of medicine on hand     REFERRAL TO PHARMACIST     Referral to the pharmacist: Not needed      [facility]     Shipping address confirmed in Epic.     Delivery Scheduled: Yes, Expected medication delivery date: 02/16/2021.     Medication will be delivered via UPS to the prescription address in Epic WAM.    Evelen Vazguez D Jakayla Schweppe   [facility] Specialty Technician

## 2021-02-15 MED FILL — DUPIXENT 300 MG/2 ML SUBCUTANEOUS SYRINGE: SUBCUTANEOUS | 28 days supply | Qty: 8 | Fill #11

## 2021-02-16 ENCOUNTER — Ambulatory Visit (INDEPENDENT_AMBULATORY_CARE_PROVIDER_SITE_OTHER): Payer: Medicaid Other

## 2021-02-16 ENCOUNTER — Other Ambulatory Visit: Payer: Self-pay

## 2021-02-16 DIAGNOSIS — L209 Atopic dermatitis, unspecified: Secondary | ICD-10-CM

## 2021-02-23 ENCOUNTER — Other Ambulatory Visit: Payer: Self-pay

## 2021-02-23 ENCOUNTER — Ambulatory Visit (INDEPENDENT_AMBULATORY_CARE_PROVIDER_SITE_OTHER): Payer: Medicaid Other

## 2021-02-23 DIAGNOSIS — L209 Atopic dermatitis, unspecified: Secondary | ICD-10-CM

## 2021-03-02 ENCOUNTER — Ambulatory Visit (INDEPENDENT_AMBULATORY_CARE_PROVIDER_SITE_OTHER): Payer: Medicaid Other

## 2021-03-02 ENCOUNTER — Other Ambulatory Visit: Payer: Self-pay

## 2021-03-02 DIAGNOSIS — L209 Atopic dermatitis, unspecified: Secondary | ICD-10-CM

## 2021-03-07 DIAGNOSIS — L209 Atopic dermatitis, unspecified: Principal | ICD-10-CM

## 2021-03-07 MED ORDER — DUPIXENT 300 MG/2 ML SUBCUTANEOUS SYRINGE
SUBCUTANEOUS | 11 refills | 28.00000 days
Start: 2021-03-07 — End: 2022-03-07

## 2021-03-07 NOTE — Unmapped (Signed)
Prescription refill request for dupixent, Last office visit was 12/16/20    Please Advise

## 2021-03-08 MED ORDER — DUPIXENT 300 MG/2 ML SUBCUTANEOUS SYRINGE
SUBCUTANEOUS | 11 refills | 28.00000 days | Status: CP
Start: 2021-03-08 — End: 2022-03-08
  Filled 2021-03-15: qty 8, 28d supply, fill #0

## 2021-03-09 ENCOUNTER — Ambulatory Visit (INDEPENDENT_AMBULATORY_CARE_PROVIDER_SITE_OTHER): Payer: Medicaid Other

## 2021-03-09 ENCOUNTER — Other Ambulatory Visit: Payer: Self-pay

## 2021-03-09 DIAGNOSIS — L209 Atopic dermatitis, unspecified: Secondary | ICD-10-CM

## 2021-03-09 NOTE — Unmapped (Signed)
[facility] Refill Coordination Note    Specialty Medication(s) to be Shipped:   Inflammatory Disorders: Dupixent    Other medication(s) to be shipped: No additional medications requested for fill at this time     Barry Rosamond., DOB: 02/07/2001  Phone: There are no phone numbers on file.      All above HIPAA information was verified with patient's family member, dad.     Was a Nurse, learning disability used for this call? No    Completed refill call assessment today to schedule patient's medication shipment from the [facility] [phone]).  All relevant notes have been reviewed.     Specialty medication(s) and dose(s) confirmed: Regimen is correct and unchanged.   Changes to medications: Barry Bond reports no changes at this time.  Changes to insurance: No  New side effects reported not previously addressed with a pharmacist or physician: None reported  Questions for the pharmacist: No    Confirmed patient received a Conservation officer, historic buildings and a Surveyor, mining with first shipment. The patient will receive a drug information handout for each medication shipped and additional FDA Medication Guides as required.       DISEASE/MEDICATION-SPECIFIC INFORMATION        For patients on injectable medications: Patient currently has 2 doses left.  Next injection is scheduled for 03/09/2021.    SPECIALTY MEDICATION ADHERENCE     Medication Adherence    Patient reported X missed doses in the last month: 0  Specialty Medication: Dupixent 300 mg/2 ml  Patient is on additional specialty medications: No  Any gaps in refill history greater than 2 weeks in the last 3 months: no  Demonstrates understanding of importance of adherence: yes  Informant: father  Reliability of informant: reliable  Support network for adherence: family member  Confirmed plan for next specialty medication refill: delivery by pharmacy  Refills needed for supportive medications: not needed              Were doses missed due to medication being on hold? No     Dupixent 300/2 mg/ml: 14 days of medicine on hand     REFERRAL TO PHARMACIST     Referral to the pharmacist: Not needed      [facility]     Shipping address confirmed in Epic.     Delivery Scheduled: Yes, Expected medication delivery date: 03/16/2021.     Medication will be delivered via UPS to the prescription address in Epic WAM.    Melchor Kirchgessner D Fredericka Bottcher   [facility] Specialty Technician

## 2021-03-14 ENCOUNTER — Ambulatory Visit (INDEPENDENT_AMBULATORY_CARE_PROVIDER_SITE_OTHER): Payer: Medicaid Other

## 2021-03-14 ENCOUNTER — Other Ambulatory Visit: Payer: Self-pay

## 2021-03-14 DIAGNOSIS — L209 Atopic dermatitis, unspecified: Secondary | ICD-10-CM

## 2021-03-23 ENCOUNTER — Ambulatory Visit (INDEPENDENT_AMBULATORY_CARE_PROVIDER_SITE_OTHER): Payer: Medicaid Other

## 2021-03-23 ENCOUNTER — Other Ambulatory Visit: Payer: Self-pay

## 2021-03-23 DIAGNOSIS — L209 Atopic dermatitis, unspecified: Secondary | ICD-10-CM | POA: Diagnosis not present

## 2021-03-30 ENCOUNTER — Other Ambulatory Visit: Payer: Self-pay

## 2021-03-30 ENCOUNTER — Ambulatory Visit (INDEPENDENT_AMBULATORY_CARE_PROVIDER_SITE_OTHER): Payer: Medicaid Other

## 2021-03-30 DIAGNOSIS — L209 Atopic dermatitis, unspecified: Secondary | ICD-10-CM | POA: Diagnosis not present

## 2021-04-04 ENCOUNTER — Ambulatory Visit: Payer: Medicaid Other

## 2021-04-06 NOTE — Unmapped (Signed)
[facility] Refill Coordination Note    Specialty Medication(s) to be Shipped:   Inflammatory Disorders: Dupixent    Other medication(s) to be shipped: No additional medications requested for fill at this time     Barry Patti., DOB: [birth year]  Phone: There are no phone numbers on file.      All above HIPAA information was verified with patient's family member, dad.     Was a Nurse, learning disability used for this call? No    Completed refill call assessment today to schedule patient's medication shipment from the [facility] [phone]).  All relevant notes have been reviewed.     Specialty medication(s) and dose(s) confirmed: Regimen is correct and unchanged.   Changes to medications: Barry Bond reports no changes at this time.  Changes to insurance: No  New side effects reported not previously addressed with a pharmacist or physician: None reported  Questions for the pharmacist: No    Confirmed patient received a Conservation officer, historic buildings and a Surveyor, mining with first shipment. The patient will receive a drug information handout for each medication shipped and additional FDA Medication Guides as required.       DISEASE/MEDICATION-SPECIFIC INFORMATION        For patients on injectable medications: Patient currently has 2 doses left.  Next injection is scheduled for 04/06/2021.    SPECIALTY MEDICATION ADHERENCE     Medication Adherence    Patient reported X missed doses in the last month: 0  Specialty Medication: Dupixent 300 mg/2 ml  Patient is on additional specialty medications: No  Any gaps in refill history greater than 2 weeks in the last 3 months: no  Demonstrates understanding of importance of adherence: yes  Informant: father  Reliability of informant: reliable  Support network for adherence: family member  Confirmed plan for next specialty medication refill: delivery by pharmacy  Refills needed for supportive medications: not needed              Were doses missed due to medication being on hold? No     Dupixent 300/2 mg/ml: 14 days of medicine on hand     REFERRAL TO PHARMACIST     Referral to the pharmacist: Not needed      [facility]     Shipping address confirmed in Epic.     Delivery Scheduled: Yes, Expected medication delivery date: 04/11/2021.     Medication will be delivered via UPS to the prescription address in Epic WAM.    Chermaine Schnyder D Janae Bonser   [facility] Specialty Technician

## 2021-04-07 ENCOUNTER — Ambulatory Visit (INDEPENDENT_AMBULATORY_CARE_PROVIDER_SITE_OTHER): Payer: Medicaid Other

## 2021-04-07 ENCOUNTER — Other Ambulatory Visit: Payer: Self-pay

## 2021-04-07 DIAGNOSIS — L209 Atopic dermatitis, unspecified: Secondary | ICD-10-CM | POA: Diagnosis not present

## 2021-04-10 DIAGNOSIS — L2089 Other atopic dermatitis: Principal | ICD-10-CM

## 2021-04-10 MED FILL — DUPIXENT 300 MG/2 ML SUBCUTANEOUS SYRINGE: SUBCUTANEOUS | 28 days supply | Qty: 8 | Fill #1

## 2021-04-12 ENCOUNTER — Other Ambulatory Visit: Payer: Self-pay

## 2021-04-12 ENCOUNTER — Ambulatory Visit (INDEPENDENT_AMBULATORY_CARE_PROVIDER_SITE_OTHER): Payer: Medicaid Other

## 2021-04-12 DIAGNOSIS — L209 Atopic dermatitis, unspecified: Secondary | ICD-10-CM

## 2021-04-20 ENCOUNTER — Ambulatory Visit (INDEPENDENT_AMBULATORY_CARE_PROVIDER_SITE_OTHER): Payer: Medicaid Other

## 2021-04-20 ENCOUNTER — Other Ambulatory Visit: Payer: Self-pay

## 2021-04-20 DIAGNOSIS — L209 Atopic dermatitis, unspecified: Secondary | ICD-10-CM | POA: Diagnosis not present

## 2021-04-24 NOTE — Addendum Note (Signed)
Addended by: Devoria Glassing on: 04/24/2021 03:18 PM   Modules accepted: Orders

## 2021-04-27 ENCOUNTER — Other Ambulatory Visit: Payer: Self-pay

## 2021-04-27 ENCOUNTER — Ambulatory Visit: Payer: Medicaid Other

## 2021-04-27 ENCOUNTER — Ambulatory Visit (INDEPENDENT_AMBULATORY_CARE_PROVIDER_SITE_OTHER): Payer: Medicaid Other

## 2021-04-27 DIAGNOSIS — L209 Atopic dermatitis, unspecified: Secondary | ICD-10-CM

## 2021-05-04 ENCOUNTER — Ambulatory Visit (INDEPENDENT_AMBULATORY_CARE_PROVIDER_SITE_OTHER): Payer: Medicaid Other

## 2021-05-04 ENCOUNTER — Other Ambulatory Visit: Payer: Self-pay

## 2021-05-04 DIAGNOSIS — L209 Atopic dermatitis, unspecified: Secondary | ICD-10-CM | POA: Diagnosis not present

## 2021-05-09 ENCOUNTER — Ambulatory Visit (INDEPENDENT_AMBULATORY_CARE_PROVIDER_SITE_OTHER): Payer: Medicaid Other

## 2021-05-09 ENCOUNTER — Other Ambulatory Visit: Payer: Self-pay

## 2021-05-09 DIAGNOSIS — L209 Atopic dermatitis, unspecified: Secondary | ICD-10-CM | POA: Diagnosis not present

## 2021-05-09 NOTE — Unmapped (Signed)
The [facility] has made a second and final attempt to reach this patient to refill the following medication:Dupixent.      We have left voicemails on the following phone numbers: [phone].    Dates contacted: 11/9 and 11/15  Last scheduled delivery: 10/18    The patient may be at risk of non-compliance with this medication. The patient should call the [facility] at [phone]  Option 4, then Option 2 (all other specialty patients) to refill medication.    Unk Lightning   [facility] Specialty Technician

## 2021-05-15 ENCOUNTER — Ambulatory Visit (INDEPENDENT_AMBULATORY_CARE_PROVIDER_SITE_OTHER): Payer: Medicaid Other

## 2021-05-15 ENCOUNTER — Other Ambulatory Visit: Payer: Self-pay

## 2021-05-15 DIAGNOSIS — L209 Atopic dermatitis, unspecified: Secondary | ICD-10-CM

## 2021-05-22 ENCOUNTER — Other Ambulatory Visit: Payer: Self-pay

## 2021-05-22 ENCOUNTER — Ambulatory Visit (INDEPENDENT_AMBULATORY_CARE_PROVIDER_SITE_OTHER): Payer: Medicaid Other

## 2021-05-22 DIAGNOSIS — L209 Atopic dermatitis, unspecified: Secondary | ICD-10-CM

## 2021-05-29 ENCOUNTER — Other Ambulatory Visit: Payer: Self-pay

## 2021-05-29 ENCOUNTER — Ambulatory Visit (INDEPENDENT_AMBULATORY_CARE_PROVIDER_SITE_OTHER): Payer: Medicaid Other

## 2021-05-29 DIAGNOSIS — L209 Atopic dermatitis, unspecified: Secondary | ICD-10-CM

## 2021-06-07 ENCOUNTER — Other Ambulatory Visit: Payer: Self-pay

## 2021-06-07 ENCOUNTER — Ambulatory Visit (INDEPENDENT_AMBULATORY_CARE_PROVIDER_SITE_OTHER): Payer: Medicaid Other

## 2021-06-07 DIAGNOSIS — L209 Atopic dermatitis, unspecified: Secondary | ICD-10-CM | POA: Diagnosis not present

## 2021-06-13 ENCOUNTER — Ambulatory Visit (INDEPENDENT_AMBULATORY_CARE_PROVIDER_SITE_OTHER): Payer: Medicaid Other

## 2021-06-13 ENCOUNTER — Other Ambulatory Visit: Payer: Self-pay

## 2021-06-13 DIAGNOSIS — L209 Atopic dermatitis, unspecified: Secondary | ICD-10-CM

## 2021-06-14 DIAGNOSIS — L209 Atopic dermatitis, unspecified: Principal | ICD-10-CM

## 2021-06-14 MED ORDER — CLOBETASOL 0.05 % TOPICAL OINTMENT
Freq: Two times a day (BID) | TOPICAL | 11 refills | 0.00000 days | Status: CP
Start: 2021-06-14 — End: ?
  Filled 2021-06-15: qty 120, 30d supply, fill #0

## 2021-06-14 NOTE — Unmapped (Signed)
[facility] Clinical Assessment & Refill Coordination Note    Barry Hilgers., DOB: 10-08-2000  Phone: There are no phone numbers on file.    All above HIPAA information was verified with patient's family member, Barry Bond.     Was a Nurse, learning disability used for this call? No    Specialty Medication(s):   Inflammatory Disorders: Dupixent     Current Outpatient Medications   Medication Sig Dispense Refill   ??? albuterol 2.5 mg /3 mL (0.083 %) nebulizer solution Use as instructed 6 mL 0   ??? albuterol HFA 90 mcg/actuation inhaler Inhale 1 puff every six (6) hours as needed. 18 g 2   ??? beclomethasone (QVAR) 80 mcg/actuation inhaler Inhale 2 puffs Two (2) times a day.      ??? calcium carbonate-vitamin D3 600 mg(1,500mg ) -800 unit Tab TAKE 1 TABLET BY MOUTH EVERY DAY 90 tablet 3   ??? cetirizine (ZYRTEC) 10 MG tablet 1 tablet once a day if needed for runny nose or itchy eyes     ??? clobetasoL (TEMOVATE) 0.05 % ointment Apply topically Two (2) times a day. 120 g 11   ??? dupilumab (DUPIXENT SYRINGE) 300 mg/2 mL Syrg injection Inject the contents of 1 syringe (300 mg total) under the skin every seven (7) days. 8 mL 11   ??? EPINEPHrine (EPIPEN) 0.3 mg/0.3 mL (1:1,000) injection      ??? EPINEPHrine (EPIPEN) 0.3 mg/0.3 mL injection Inject 0.3 mL (0.3 mg total) into the muscle once. for 1 dose In case of anaphylaxis 2 each 3   ??? fluticasone (FLONASE) 50 mcg/actuation nasal spray      ??? fluticasone propion-salmeteroL (ADVAIR) 250-50 mcg/dose diskus Inhale 1 puff Two (2) times a day to prevent coughing or wheezing. Rinse, gargle and spit out after use. Due for 6 month check-up! 60 each 0   ??? predniSONE (DELTASONE) 10 MG tablet Take 1 tablet (10 mg total) by mouth daily. 30 tablet 0   ??? sulfamethoxazole-trimethoprim (BACTRIM DS) 800-160 mg per tablet Take one tablet by mouth daily 30 tablet 0   ??? tacrolimus (PROTOPIC) 0.03 % ointment Apply topically.       No current facility-administered medications for this visit.        Changes to medications: Kyndall reports no changes at this time.    Allergies   Allergen Reactions   ??? Benadryl [Diphenhydramine Hcl] Swelling   ??? Peanut Swelling and Other (See Comments)     Face   ??? Peanut Oil Anaphylaxis     Pt has an epi pen  Pt has an epi pen   ??? Soy Anaphylaxis   ??? Cetirizine Other (See Comments)     Other reaction(s): Unknown   ??? Cyproheptadine Other (See Comments)     Other Reaction: Other reaction   ??? Fish Containing Products Other (See Comments)     Unknown  Other reaction(s): UNKNOWN   ??? Hydroxyzine Other (See Comments)   ??? Shellfish Containing Products      Unknown   ??? Nickel Rash       Changes to allergies: No    SPECIALTY MEDICATION ADHERENCE     Dupixent - 1 left    Medication Adherence    Patient reported X missed doses in the last month: 0  Specialty Medication: DUPIXENT  Support network for adherence: family member          Specialty medication(s) dose(s) confirmed: Regimen is correct and unchanged.     Are  there any concerns with adherence? Yes, potentially. Did not fill in November, and Barry denies missed doses. Has 1 left.     Adherence counseling provided? deferred - Barry denied missed doses and reports continued good effect    CLINICAL MANAGEMENT AND INTERVENTION      Clinical Benefit Assessment:    Do you feel the medicine is effective or helping your condition? Yes    Clinical Benefit counseling provided? Not needed    Adverse Effects Assessment:    Are you experiencing any side effects? No    Are you experiencing difficulty administering your medicine? No    Quality of Life Assessment:    Quality of Life    Rheumatology  Oncology  Dermatology  1. What impact has your specialty medication had on the symptoms of your skin condition (i.e. itchiness, soreness, stinging)?: Some  2. What impact has your specialty medication had on your comfort level with your skin?: Some  Cystic Fibrosis          Have you discussed this with your provider? Not needed    Acute Infection Status:    Acute infections noted within Epic:  No active infections  Patient reported infection: None    Therapy Appropriateness:    Is therapy appropriate and patient progressing towards therapeutic goals? Yes, therapy is appropriate and should be continued    DISEASE/MEDICATION-SPECIFIC INFORMATION      For patients on injectable medications: Patient currently has 1 doses left.  Next injection is scheduled for Tues, 12/27.    PATIENT SPECIFIC NEEDS     - Does the patient have any physical, cognitive, or cultural barriers? No    - Is the patient high risk? No    - Does the patient require a Care Management Plan? No     SHIPPING     Specialty Medication(s) to be Shipped:   Inflammatory Disorders: Dupixent    Other medication(s) to be shipped: Clobetasol ointment - refill requested     Changes to insurance: No    Delivery Scheduled: Yes, Expected medication delivery date: Friday, 12/23.     Medication will be delivered via UPS to the confirmed prescription address in [facility].    The patient will receive a drug information handout for each medication shipped and additional FDA Medication Guides as required.  Verified that patient has previously received a Conservation officer, historic buildings and a Surveyor, mining.    The patient or caregiver noted above participated in the development of this care plan and knows that they can request review of or adjustments to the care plan at any time.      All of the patient's questions and concerns have been addressed.    [name]   [facility] Specialty Pharmacist

## 2021-06-15 MED FILL — DUPIXENT 300 MG/2 ML SUBCUTANEOUS SYRINGE: SUBCUTANEOUS | 28 days supply | Qty: 8 | Fill #2

## 2021-06-19 NOTE — Unmapped (Signed)
Dermatology Note     Assessment and Plan:      Atopic Dermatitis,??severe,??improving but not at treatment goal: No sign of superinfection today.  - Has failed numerous previous therapies. Previously treated with pred w/ shown decreased bone density  -??Has had an extensive work up for immune deficiency including immunoglobulins, lymphocyte markers, mitogens, vaccine response, genetic testing, and skin biopsy all pointing to normal eczema.  - Given extent of flare on exam today, we discussed alternative options including methotrexate vs upadacitinib pending lab results. Discussed the side effects of methotrexate, including but not limited to increased risk for infection, decreased blood counts, liver damage or failure, and GI upset. Will discuss plan with patient after obtaining lab results.Also discussed side effects of upadicitinib including increased clot risk, infection, cardiovascular events, and malignancy. Patient and father amenable to this option.   - In the meantime, we jointly elected to proceed with the following:  -??Continue??dupilumab (DUPIXENT) 300 mg/2 mL Syrg injection,??every 7 days. Highly encouraged annual opthalmology exams, unclear per history whether they are following regularly.   - Continue??heavy??emollients, prefer cerave and vaseline.  -??Continue??triamcinolone (KENALOG) 0.1 % ointment; Apply topically Two (2) times a day. For wet wraps, will need to 908 g quantity??Apply wet wraps after application. Instructions for occlusion provided today??in Handout.  -??Continue??clobetasoL (TEMOVATE) 0.05 % ointment; Apply topically Two (2) times a day. To thicker areas of rash, avoid face and skin folds  - Start protopic 0.1% ointment. Apply topically Two (2) times a day to affected areas.  - nbUVB is not feasible in person given that they are driving long distance from [facility]. Will attempt to get home phototherapy covered by insurance.     High risk medication use (in anticipation of starting methotrexate or upadacitinib)  - Pended baseline labs as below:  - CBC w/ Differential  - AST  - ALT  - BUN  - Creatinine  - Hepatitis B Core Antibody, total  - Hepatitis B Surface Antibody  - Hepatitis B Surface Antigen  - Hepatitis C Antibody  - Quantiferon TB Gold Plus    The patient was advised to call for an appointment should any new, changing, or symptomatic lesions develop.     RTC: Return in about 4 weeks (around 07/18/2021) for follow up of eczema. or sooner as needed   _________________________________________________________________      Chief Complaint     Chief Complaint   Patient presents with   ??? Rash     Itchy upper body rash, requests skin check; pt states on dupixent       HPI     Barry Bond. is a 20 y.o. male who presents as a returning patient (last seen by Dr. Carles Collet 12/16/2020) to [facility] for follow up of atopic dermatitis. At last visit, patient was to continue dupilumab 300 mg every 7 days, triamcinolone 0.1% ointment and clobetasol 0.05% ointment for atopic dermatitis.    Eczema  - Father reports 80% improvement of eczema since starting dupixent  - currently treating with weekly dupixent injections (with allergist in [facility]) and clobetasol  - Would like to discuss other injection options because he still has significant flaring  - Patient reportedly is a non-smoker with no history of blood clots    The patient denies any other new or changing lesions or areas of concern.     Pertinent Past Medical History     No history of skin cancer    Family History:   Negative for  melanoma    Past Medical History, Family History, Social History, Medication List, Allergies, and Problem List were reviewed in the rooming section of Epic.     ROS: Other than symptoms mentioned in the HPI, no fevers, chills, or other skin complaints    Physical Examination     GENERAL: Well-appearing male in no acute distress, resting comfortably.  NEURO: Alert and oriented, answers questions appropriately  PSYCH: Normal mood and affect  SKIN (Waist Up Skin Exam): Per patient request, exam of the scalp, face, eyelids, lips, nose, ears, neck, chest, upper abdomen, back, arms, hands, and fingernails was performed  - Bilateral arms, lower legs, face with erythematous scaly plaques  - diffuse xerosis  - Bilateral eyelids and periorbital area with edema and erythema  - Declined BLE exam even though it was highly recommended    All areas not commented on are within normal limits or unremarkable    Scribe's Attestation: Cruzita Lederer, MD obtained and performed the history, physical exam and medical decision making elements that were entered into the chart. Signed by Laurina Bustle, Scribe, on June 20, 2021 11:17 AM.    ----------------------------------------------------------------------------------------------------------------------  June 20, 2021 3:42 PM. Documentation assistance provided by the Scribe. I was present during the time the encounter was recorded. The information recorded by the Scribe was done at my direction and has been reviewed and validated by me.  ----------------------------------------------------------------------------------------------------------------------     (Approved Template 03/07/2020)

## 2021-06-20 ENCOUNTER — Other Ambulatory Visit: Payer: Self-pay

## 2021-06-20 ENCOUNTER — Ambulatory Visit (INDEPENDENT_AMBULATORY_CARE_PROVIDER_SITE_OTHER): Payer: Medicaid Other

## 2021-06-20 ENCOUNTER — Ambulatory Visit
Admit: 2021-06-20 | Discharge: 2021-06-21 | Payer: BLUE CROSS/BLUE SHIELD | Attending: Student in an Organized Health Care Education/Training Program | Primary: Student in an Organized Health Care Education/Training Program

## 2021-06-20 DIAGNOSIS — L209 Atopic dermatitis, unspecified: Secondary | ICD-10-CM

## 2021-06-20 DIAGNOSIS — Z79899 Other long term (current) drug therapy: Principal | ICD-10-CM

## 2021-06-20 LAB — CBC W/ AUTO DIFF
BASOPHILS ABSOLUTE COUNT: 0.1 10*9/L (ref 0.0–0.1)
BASOPHILS RELATIVE PERCENT: 0.9 %
EOSINOPHILS ABSOLUTE COUNT: 0.9 10*9/L — ABNORMAL HIGH (ref 0.0–0.5)
EOSINOPHILS RELATIVE PERCENT: 9.8 %
HEMATOCRIT: 45.8 % (ref 39.0–48.0)
HEMOGLOBIN: 14.6 g/dL (ref 12.9–16.5)
LYMPHOCYTES ABSOLUTE COUNT: 1.7 10*9/L (ref 1.1–3.6)
LYMPHOCYTES RELATIVE PERCENT: 19.3 %
MEAN CORPUSCULAR HEMOGLOBIN CONC: 31.9 g/dL — ABNORMAL LOW (ref 32.0–36.0)
MEAN CORPUSCULAR HEMOGLOBIN: 27.3 pg (ref 25.9–32.4)
MEAN CORPUSCULAR VOLUME: 85.5 fL (ref 77.6–95.7)
MEAN PLATELET VOLUME: 9.4 fL (ref 6.8–10.7)
MONOCYTES ABSOLUTE COUNT: 0.4 10*9/L (ref 0.3–0.8)
MONOCYTES RELATIVE PERCENT: 4.9 %
NEUTROPHILS ABSOLUTE COUNT: 5.9 10*9/L (ref 1.8–7.8)
NEUTROPHILS RELATIVE PERCENT: 65.1 %
PLATELET COUNT: 297 10*9/L (ref 150–450)
RED BLOOD CELL COUNT: 5.36 10*12/L (ref 4.26–5.60)
RED CELL DISTRIBUTION WIDTH: 13.2 % (ref 12.2–15.2)
WBC ADJUSTED: 9 10*9/L (ref 3.6–11.2)

## 2021-06-20 LAB — BUN: BLOOD UREA NITROGEN: 9 mg/dL (ref 9–23)

## 2021-06-20 LAB — CREATININE
CREATININE: 0.59 mg/dL — ABNORMAL LOW
EGFR CKD-EPI (2021) MALE: >90 mL/min/{1.73_m2} (ref >=60)

## 2021-06-20 LAB — ALT: ALT (SGPT): 24 U/L (ref 10–49)

## 2021-06-20 LAB — AST: AST (SGOT): 23 U/L (ref <=34)

## 2021-06-20 MED ORDER — TACROLIMUS 0.1 % TOPICAL OINTMENT
Freq: Two times a day (BID) | TOPICAL | 1 refills | 30.00000 days | Status: CP
Start: 2021-06-20 — End: 2022-06-20

## 2021-06-20 NOTE — Unmapped (Addendum)
Follow up with the eye doctor regularly. This is important while on dupixent.    Continue clobetasol ointment twice a day to thicker areas of rash.     Moisturize twice daily with vaseline    Instrucciones para envoltura h??meda (Wet wraps)    Medicamentos:    Crema de triamcinolona aplicada en el cuello, parte delantera y trasera del tronco, brazos y piernas.    Crema de hidrocortisona al 2.5% aplicada en el rostro, pliegues   Vaselina     Instrucciones para aplicar las envolturas   1. Cubra la cama con s??banas viejas   2. [place] el cuello del [place], la parte delantera y trasera del tronco, los brazos y las piernas con Therapist, occupational; evite el uso en la cara, las axilas y la ingle. Aplique hidrocortisona en la cara, las axilas y la ingle, si es necesario.   3. Pollyann Savoy aplicados los esteroides, [place] al paciente (cuello, parte delantera y trasera del tronco, brazos y piernas) con toallas de algod??n empapadas con agua a temperatura ambiente.   4. Baruch Gouty con s??banas o mantas para mantener al paciente caliente, seg??n sea necesario.   5. Dejar en su lugar durante 1.5 horas, remover todo y Haematologist.   6. Aplique vaselina generosamente.   7. Repita el ciclo tres veces al d??a.    Please have your labs drawn today.    We will attempt to get home light therapy for you.

## 2021-06-21 LAB — QUANTIFERON TB GOLD PLUS
QUANTIFERON ANTIGEN 1 MINUS NIL: 0.01 [IU]/mL
QUANTIFERON ANTIGEN 2 MINUS NIL: 0 [IU]/mL
QUANTIFERON MITOGEN: 6.38 [IU]/mL
QUANTIFERON TB GOLD PLUS: NEGATIVE
QUANTIFERON TB NIL VALUE: 0.01 [IU]/mL

## 2021-06-21 LAB — HEPATITIS B SURFACE ANTIBODY
HEPATITIS B SURFACE ANTIBODY QUANT: <8 m[IU]/mL (ref <8.00)
HEPATITIS B SURFACE ANTIBODY: NONREACTIVE

## 2021-06-21 LAB — TB AG2: TB AG2 VALUE: 0.01

## 2021-06-21 LAB — HEPATITIS B CORE ANTIBODY, TOTAL: HEPATITIS B CORE TOTAL ANTIBODY: NONREACTIVE

## 2021-06-21 LAB — HEPATITIS C ANTIBODY: HEPATITIS C ANTIBODY: NONREACTIVE

## 2021-06-21 LAB — HEPATITIS B SURFACE ANTIGEN: HEPATITIS B SURFACE ANTIGEN: NONREACTIVE

## 2021-06-21 LAB — TB NIL: TB NIL VALUE: 0.01

## 2021-06-21 LAB — TB AG1: TB AG1 VALUE: 0.02

## 2021-06-21 LAB — TB MITOGEN: TB MITOGEN VALUE: 6.39

## 2021-06-22 NOTE — Unmapped (Signed)
Labs wnl. Will see patient in January for follow up for extensive discussion on risks vs benefits of upadicitinib vs. Methotrexate. Given These medications are high risk and I am concerned that at last visit about patient/ patient's father did not completely understand the risks/ side effects. I think an in-person visit with another conversation to review the medications is necessary for safe administration of methotrexate vs. upadicitinib.

## 2021-06-23 NOTE — Unmapped (Signed)
Called and left voicemail to discuss patient's lab results and next steps. Instructed patient's father to call back to discuss results and plan.

## 2021-06-28 ENCOUNTER — Other Ambulatory Visit: Payer: Self-pay

## 2021-06-28 ENCOUNTER — Ambulatory Visit (INDEPENDENT_AMBULATORY_CARE_PROVIDER_SITE_OTHER): Payer: Medicaid Other

## 2021-06-28 DIAGNOSIS — L209 Atopic dermatitis, unspecified: Secondary | ICD-10-CM

## 2021-07-03 ENCOUNTER — Other Ambulatory Visit: Payer: Self-pay

## 2021-07-03 ENCOUNTER — Ambulatory Visit (INDEPENDENT_AMBULATORY_CARE_PROVIDER_SITE_OTHER): Payer: Medicaid Other

## 2021-07-03 DIAGNOSIS — L209 Atopic dermatitis, unspecified: Secondary | ICD-10-CM

## 2021-07-06 NOTE — Unmapped (Signed)
[facility] Refill Coordination Note    Specialty Medication(s) to be Shipped:   Inflammatory Disorders: Dupixent    Other medication(s) to be shipped: No additional medications requested for fill at this time     Barry Bond., DOB: 2001-05-22  Phone: There are no phone numbers on file.      All above HIPAA information was verified with patient's caregiver, Father     Was a Nurse, learning disability used for this call? No    Completed refill call assessment today to schedule patient's medication shipment from the [facility] [phone]).  All relevant notes have been reviewed.     Specialty medication(s) and dose(s) confirmed: Regimen is correct and unchanged.   Changes to medications: Oak reports no changes at this time.  Changes to insurance: No  New side effects reported not previously addressed with a pharmacist or physician: None reported  Questions for the pharmacist: No    Confirmed patient received a Conservation officer, historic buildings and a Surveyor, mining with first shipment. The patient will receive a drug information handout for each medication shipped and additional FDA Medication Guides as required.       DISEASE/MEDICATION-SPECIFIC INFORMATION        For patients on injectable medications: Patient currently has 0 doses left.  Next injection is scheduled for 07/12/21.    SPECIALTY MEDICATION ADHERENCE     Medication Adherence    Patient reported X missed doses in the last month: 0  Specialty Medication: DUPIXENT  Patient is on additional specialty medications: No  Support network for adherence: family member              Were doses missed due to medication being on hold? No      REFERRAL TO PHARMACIST     Referral to the pharmacist: Not needed      [facility]     Shipping address confirmed in Epic.     Delivery Scheduled: Yes, Expected medication delivery date: 07/12/21.     Medication will be delivered via UPS to the prescription address in Epic WAM.    [place] A Leya Paige   [facility] Specialty Technician

## 2021-07-10 ENCOUNTER — Ambulatory Visit (INDEPENDENT_AMBULATORY_CARE_PROVIDER_SITE_OTHER): Payer: Medicaid Other | Admitting: *Deleted

## 2021-07-10 DIAGNOSIS — L209 Atopic dermatitis, unspecified: Secondary | ICD-10-CM | POA: Diagnosis not present

## 2021-07-11 MED FILL — DUPIXENT 300 MG/2 ML SUBCUTANEOUS SYRINGE: SUBCUTANEOUS | 28 days supply | Qty: 8 | Fill #3

## 2021-07-17 ENCOUNTER — Ambulatory Visit (INDEPENDENT_AMBULATORY_CARE_PROVIDER_SITE_OTHER): Payer: Medicaid Other

## 2021-07-17 ENCOUNTER — Other Ambulatory Visit: Payer: Self-pay

## 2021-07-17 DIAGNOSIS — L209 Atopic dermatitis, unspecified: Secondary | ICD-10-CM

## 2021-07-18 ENCOUNTER — Ambulatory Visit
Admit: 2021-07-18 | Discharge: 2021-07-19 | Payer: BLUE CROSS/BLUE SHIELD | Attending: Student in an Organized Health Care Education/Training Program | Primary: Student in an Organized Health Care Education/Training Program

## 2021-07-18 DIAGNOSIS — Z79899 Other long term (current) drug therapy: Principal | ICD-10-CM

## 2021-07-18 DIAGNOSIS — L2084 Intrinsic (allergic) eczema: Principal | ICD-10-CM

## 2021-07-18 MED ORDER — FOLIC ACID 1 MG TABLET
ORAL_TABLET | 2 refills | 0.00000 days | Status: CP
Start: 2021-07-18 — End: ?

## 2021-07-18 MED ORDER — METHOTREXATE SODIUM 2.5 MG TABLET
ORAL_TABLET | ORAL | 0 refills | 35.00000 days | Status: CP
Start: 2021-07-18 — End: 2021-08-29

## 2021-07-18 NOTE — Unmapped (Signed)
Please start Methotrexate 10 mg (please take only on Tuesday)  Please take Folic Acid on all other days (Monday, Wednesday, Thursday, Friday, Saturday, Sunday)   Please attain blood work prior to next visit

## 2021-07-18 NOTE — Unmapped (Signed)
Dermatology Note     Assessment and Plan:      Atopic Dermatitis,??severe,??improving but not at treatment goal: No sign of superinfection today.  - Has failed numerous previous therapies. Previously treated with pred w/ shown decreased bone density  -??Has had an extensive work up for immune deficiency including immunoglobulins, lymphocyte markers, mitogens, vaccine response, genetic testing, and skin biopsy all pointing to normal eczema.  - Given extent of disease on exam today, we discussed alternative options including methotrexate. Discussed the side effects of methotrexate, including but not limited to increased risk for infection, decreased blood counts, liver damage or failure, and GI upset. Patient and father amenable to this option.   - In the meantime, we jointly elected to proceed with the following:  -??Continue??dupilumab (DUPIXENT) 300 mg/2 mL Syrg injection,??every 7 days.  - Continue??heavy??emollients, prefer cerave and vaseline.  -??Continue??triamcinolone (KENALOG) 0.1 % ointment; Apply topically Two (2) times a day. -??Continue??clobetasoL (TEMOVATE) 0.05 % ointment; Apply topically Two (2) times a day. To thicker areas of rash, avoid face and skin folds  - Continue protopic 0.1% ointment. Apply topically Two (2) times a day to affected areas.  - Start Methotrexate 10 mg weekly with folic acid on days not taking methotrexate    High risk medication use (methotrexate)  - Labs attained at last visit, all WNL   - Methotrexate follow up labs are pended for patient to attain prior to next visit     The patient was advised to call for an appointment should any new, changing, or symptomatic lesions develop.     RTC: Return in about 1 month (around 08/18/2021). or sooner as needed   _________________________________________________________________      Chief Complaint     Chief Complaint   Patient presents with   ??? Skin Check     NECK        HPI     Barry Bond. is a 21 y.o. male who presents as a returning patient to [facility] for follow up of atopic dermatitis. The patient is currently on Dupixent and here to discuss additional treatment options.     The patient denies any other new or changing lesions or areas of concern.     Pertinent Past Medical History     No history of skin cancer    Family History:   Negative for melanoma    Past Medical History, Family History, Social History, Medication List, Allergies, and Problem List were reviewed in the rooming section of Epic.     ROS: Other than symptoms mentioned in the HPI, no fevers, chills, or other skin complaints    Physical Examination     GENERAL: Well-appearing male in no acute distress, resting comfortably.  NEURO: Alert and oriented, answers questions appropriately  PSYCH: Normal mood and affect  SKIN (Focal Skin Exam): Per patient request, examination of arms, legs, face,  neck was performed  - Bilateral arms, lower legs, face with erythematous scaly plaques  - Bilateral eyelids and periorbital area with edema and erythema  - Diffuse xerosis     All areas not commented on are within normal limits or unremarkable

## 2021-07-25 ENCOUNTER — Ambulatory Visit (INDEPENDENT_AMBULATORY_CARE_PROVIDER_SITE_OTHER): Payer: Medicaid Other

## 2021-07-25 ENCOUNTER — Other Ambulatory Visit: Payer: Self-pay

## 2021-07-25 DIAGNOSIS — L209 Atopic dermatitis, unspecified: Secondary | ICD-10-CM

## 2021-07-26 NOTE — Unmapped (Signed)
[facility] Refill Coordination Note    Specialty Medication(s) to be Shipped:   Inflammatory Disorders: Dupixent    Other medication(s) to be shipped: No additional medications requested for fill at this time     Barry Bond., DOB: Oct 12, 2000  Phone: There are no phone numbers on file.      All above HIPAA information was verified with patient.     Was a Nurse, learning disability used for this call? No    Completed refill call assessment today to schedule patient's medication shipment from the [facility] [phone]).  All relevant notes have been reviewed.     Specialty medication(s) and dose(s) confirmed: Regimen is correct and unchanged.   Changes to medications: Barry Bond reports starting the following medications: methotrexate,and folic acid  Changes to insurance: No  New side effects reported not previously addressed with a pharmacist or physician: None reported  Questions for the pharmacist: No    Confirmed patient received a Conservation officer, historic buildings and a Surveyor, mining with first shipment. The patient will receive a drug information handout for each medication shipped and additional FDA Medication Guides as required.       DISEASE/MEDICATION-SPECIFIC INFORMATION        For patients on injectable medications: Patient currently has 1 doses left.  Next injection is scheduled for 02/07.    SPECIALTY MEDICATION ADHERENCE     Medication Adherence    Patient reported X missed doses in the last month: 0  Specialty Medication: dupixent 300mg /62ml  Patient is on additional specialty medications: No  Patient is on more than two specialty medications: No  Any gaps in refill history greater than 2 weeks in the last 3 months: no  Demonstrates understanding of importance of adherence: yes  Informant: patient  Reliability of informant: reliable  Provider-estimated medication adherence level: good  Patient is at risk for Non-Adherence: No  Reasons for non-adherence: no problems identified  Support network for adherence: family member  Confirmed plan for next specialty medication refill: delivery by pharmacy  Refills needed for supportive medications: not needed          Refill Coordination    Has the Patients' Contact Information Changed: No  Is the Shipping Address Different: No         Were doses missed due to medication being on hold? No    dupixent 300/2 mg/ml: 7 days of medicine on hand         REFERRAL TO PHARMACIST     Referral to the pharmacist: Not needed      [facility]     Shipping address confirmed in Epic.     Delivery Scheduled: Yes, Expected medication delivery date: 02/10.     Medication will be delivered via UPS to the prescription address in Epic WAM.    Barry Bond   [facility] Specialty Technician

## 2021-08-01 ENCOUNTER — Other Ambulatory Visit: Payer: Self-pay

## 2021-08-01 ENCOUNTER — Ambulatory Visit (INDEPENDENT_AMBULATORY_CARE_PROVIDER_SITE_OTHER): Payer: Medicaid Other

## 2021-08-01 DIAGNOSIS — L209 Atopic dermatitis, unspecified: Secondary | ICD-10-CM | POA: Diagnosis not present

## 2021-08-03 MED FILL — DUPIXENT 300 MG/2 ML SUBCUTANEOUS SYRINGE: SUBCUTANEOUS | 28 days supply | Qty: 8 | Fill #4

## 2021-08-09 ENCOUNTER — Ambulatory Visit: Payer: Medicaid Other

## 2021-08-10 ENCOUNTER — Other Ambulatory Visit: Payer: Self-pay

## 2021-08-10 ENCOUNTER — Ambulatory Visit (INDEPENDENT_AMBULATORY_CARE_PROVIDER_SITE_OTHER): Payer: Medicaid Other

## 2021-08-10 DIAGNOSIS — L209 Atopic dermatitis, unspecified: Secondary | ICD-10-CM | POA: Diagnosis not present

## 2021-08-14 ENCOUNTER — Ambulatory Visit: Payer: Medicaid Other

## 2021-08-15 ENCOUNTER — Other Ambulatory Visit: Payer: Self-pay

## 2021-08-15 ENCOUNTER — Ambulatory Visit (INDEPENDENT_AMBULATORY_CARE_PROVIDER_SITE_OTHER): Payer: Medicaid Other

## 2021-08-15 DIAGNOSIS — L209 Atopic dermatitis, unspecified: Secondary | ICD-10-CM | POA: Diagnosis not present

## 2021-08-21 NOTE — Unmapped (Signed)
ATTEMPT Triage pt used Interpreter # [phone]

## 2021-08-22 ENCOUNTER — Ambulatory Visit: Payer: Medicaid Other

## 2021-08-22 ENCOUNTER — Ambulatory Visit
Admit: 2021-08-22 | Discharge: 2021-08-23 | Payer: BLUE CROSS/BLUE SHIELD | Attending: Student in an Organized Health Care Education/Training Program | Primary: Student in an Organized Health Care Education/Training Program

## 2021-08-22 DIAGNOSIS — L2084 Intrinsic (allergic) eczema: Principal | ICD-10-CM

## 2021-08-22 DIAGNOSIS — Z79899 Other long term (current) drug therapy: Principal | ICD-10-CM

## 2021-08-22 LAB — CBC W/ AUTO DIFF
BASOPHILS ABSOLUTE COUNT: 0.1 10*9/L (ref 0.0–0.1)
BASOPHILS RELATIVE PERCENT: 0.8 %
EOSINOPHILS ABSOLUTE COUNT: 0.8 10*9/L — ABNORMAL HIGH (ref 0.0–0.5)
EOSINOPHILS RELATIVE PERCENT: 10.3 %
HEMATOCRIT: 43.9 % (ref 39.0–48.0)
HEMOGLOBIN: 15 g/dL (ref 12.9–16.5)
LYMPHOCYTES ABSOLUTE COUNT: 1.5 10*9/L (ref 1.1–3.6)
LYMPHOCYTES RELATIVE PERCENT: 19.7 %
MEAN CORPUSCULAR HEMOGLOBIN CONC: 34.3 g/dL (ref 32.0–36.0)
MEAN CORPUSCULAR HEMOGLOBIN: 28.7 pg (ref 25.9–32.4)
MEAN CORPUSCULAR VOLUME: 83.9 fL (ref 77.6–95.7)
MEAN PLATELET VOLUME: 9.2 fL (ref 6.8–10.7)
MONOCYTES ABSOLUTE COUNT: 0.4 10*9/L (ref 0.3–0.8)
MONOCYTES RELATIVE PERCENT: 5.7 %
NEUTROPHILS ABSOLUTE COUNT: 4.8 10*9/L (ref 1.8–7.8)
NEUTROPHILS RELATIVE PERCENT: 63.5 %
PLATELET COUNT: 259 10*9/L (ref 150–450)
RED BLOOD CELL COUNT: 5.23 10*12/L (ref 4.26–5.60)
RED CELL DISTRIBUTION WIDTH: 13.3 % (ref 12.2–15.2)
WBC ADJUSTED: 7.6 10*9/L (ref 3.6–11.2)

## 2021-08-22 LAB — CREATININE
CREATININE: 0.5 mg/dL — ABNORMAL LOW
EGFR CKD-EPI (2021) MALE: >90 mL/min/{1.73_m2} (ref >=60)

## 2021-08-22 LAB — ALT: ALT (SGPT): 34 U/L (ref 10–49)

## 2021-08-22 LAB — BUN: BLOOD UREA NITROGEN: 11 mg/dL (ref 9–23)

## 2021-08-22 LAB — AST: AST (SGOT): 25 U/L (ref <=34)

## 2021-08-22 MED ORDER — FOLIC ACID 1 MG TABLET
ORAL_TABLET | 2 refills | 0.00000 days | Status: CP
Start: 2021-08-22 — End: ?

## 2021-08-22 NOTE — Unmapped (Signed)
Dermatology Note     Assessment and Plan:      Atopic Dermatitis, chronic and severe, improving but still flaring and not at treatment goal: No sign of superinfection today.  - Has failed numerous previous therapies. Previously treated with pred w/ shown decreased bone density  - Has had an extensive work up for immune deficiency including immunoglobulins, lymphocyte markers, mitogens, vaccine response, genetic testing, and skin biopsy all pointing to normal eczema.  - Continue dupilumab (DUPIXENT) 300 mg/2 mL Syrg injection, every 7 days.  - Continue heavy emollients, prefer cerave and vaseline.  - Continue triamcinolone (KENALOG) 0.1 % ointment; Apply topically Two (2) times a day.  - Continue clobetasoL (TEMOVATE) 0.05 % ointment; Apply topically Two (2) times a day. To thicker areas of rash, avoid face and skin folds  - Continue protopic 0.1% ointment. Apply topically Two (2) times a day to affected areas.  - offered increase in methotrexate vs continuing same dose. Patient and family opted to continue Methotrexate 10 mg weekly with folic acid on days not taking methotrexate. Will consider increase to 15 mg weekly at next visit based on labs and progress    High risk medication, methotrexate  Quantiferon gold negative 06/20/21  - labs today: CBC with diff, AST ALT BUN Cr    The patient was advised to call for an appointment should any new, changing, or symptomatic lesions develop.     RTC: Return in about 1 month (around 09/19/2021). or sooner as needed   _________________________________________________________________      Chief Complaint     Chief Complaint   Patient presents with    Follow-up     Medication check and lab work no areas of concern       HPI     Barry Bond. is a 21 y.o. male who presents as a returning patient (last seen 07/18/2021) to Dermatology for follow up of atopic dermatitis. patient and family declined interpreter. dupilumab and methotrexate are helping. Skin is less itchy and inflamed. No longer disrupts sleep. No side effects from either medication. Takes both medications on Tuesdays. Takes folic acid daily on non methotrexate days. Uses clobetasol on the arms and legs. Patient is happy with improvement in skin.    The patient denies any other new or changing lesions or areas of concern.     Pertinent Past Medical History     Past Medical History, Family History, Social History, Medication List, Allergies, and Problem List were reviewed in the rooming section of Epic.     ROS: Other than symptoms mentioned in the HPI, no fevers, chills, or other skin complaints    Physical Examination     GENERAL: Well-appearing male in no acute distress, resting comfortably.  NEURO: Alert and oriented, answers questions appropriately  SKIN (Sun exposed Exam): Per patient request, examination of the face, neck, scalp, bilateral upper extremities, palms, and fingernails was performed    Scattered erythematous scaly macules and patches on face, AC fossae bilaterally, and left lower leg. Few excoriations on AC fossae.  Interval improvement in atopic dermatitis with reduction in erythema and scaling of extremities.  All areas not commented on are within normal limits or unremarkable      (Approved Template 03/07/2020)

## 2021-08-23 ENCOUNTER — Ambulatory Visit (INDEPENDENT_AMBULATORY_CARE_PROVIDER_SITE_OTHER): Payer: Medicaid Other

## 2021-08-23 ENCOUNTER — Other Ambulatory Visit: Payer: Self-pay

## 2021-08-23 DIAGNOSIS — L209 Atopic dermatitis, unspecified: Secondary | ICD-10-CM

## 2021-08-23 MED ORDER — METHOTREXATE SODIUM 2.5 MG TABLET
ORAL_TABLET | ORAL | 0 refills | 35.00000 days | Status: CP
Start: 2021-08-23 — End: 2021-10-04

## 2021-08-25 NOTE — Unmapped (Signed)
[facility] Pharmacy Refill Coordination Note    Specialty Medication(s) to be Shipped:   Inflammatory Disorders: Dupixent    Other medication(s) to be shipped: No additional medications requested for fill at this time     Barry Bond., DOB: January 23, 2001  Phone: There are no phone numbers on file.      All above HIPAA information was verified with patient's family member, father.     Was a Nurse, learning disability used for this call? No    Completed refill call assessment today to schedule patient's medication shipment from the [facility] [phone]).  All relevant notes have been reviewed.     Specialty medication(s) and dose(s) confirmed: Regimen is correct and unchanged.   Changes to medications: Rahn reports no changes at this time.  Changes to insurance: No  New side effects reported not previously addressed with a pharmacist or physician: None reported  Questions for the pharmacist: No    Confirmed patient received a Conservation officer, historic buildings and a Surveyor, mining with first shipment. The patient will receive a drug information handout for each medication shipped and additional FDA Medication Guides as required.       DISEASE/MEDICATION-SPECIFIC INFORMATION        For patients on injectable medications: Patient currently has 1 doses left.  Next injection is scheduled for 08/29/21.    SPECIALTY MEDICATION ADHERENCE     Medication Adherence    Patient reported X missed doses in the last month: 0  Specialty Medication: Dupixent  Patient is on additional specialty medications: No  Informant: patient  Support network for adherence: family member              Were doses missed due to medication being on hold? No    Dupixent 300 mg/61ml: 7 days of medicine on hand     REFERRAL TO PHARMACIST     Referral to the pharmacist: Not needed      [facility]     Shipping address confirmed in Epic.     Delivery Scheduled: Yes, Expected medication delivery date: 08/30/21.     Medication will be delivered via UPS to the prescription address in Epic WAM.    Arnold Long   [facility] Specialty Pharmacist

## 2021-08-28 ENCOUNTER — Other Ambulatory Visit: Payer: Self-pay

## 2021-08-28 ENCOUNTER — Ambulatory Visit (INDEPENDENT_AMBULATORY_CARE_PROVIDER_SITE_OTHER): Payer: Medicaid Other

## 2021-08-28 DIAGNOSIS — L209 Atopic dermatitis, unspecified: Secondary | ICD-10-CM

## 2021-08-29 MED FILL — DUPIXENT 300 MG/2 ML SUBCUTANEOUS SYRINGE: SUBCUTANEOUS | 28 days supply | Qty: 8 | Fill #5

## 2021-09-04 ENCOUNTER — Ambulatory Visit (INDEPENDENT_AMBULATORY_CARE_PROVIDER_SITE_OTHER): Payer: Medicaid Other | Admitting: *Deleted

## 2021-09-04 ENCOUNTER — Other Ambulatory Visit: Payer: Self-pay

## 2021-09-04 DIAGNOSIS — L209 Atopic dermatitis, unspecified: Secondary | ICD-10-CM

## 2021-09-07 ENCOUNTER — Ambulatory Visit: Payer: Medicaid Other

## 2021-09-11 ENCOUNTER — Ambulatory Visit (INDEPENDENT_AMBULATORY_CARE_PROVIDER_SITE_OTHER): Payer: Medicaid Other

## 2021-09-11 ENCOUNTER — Other Ambulatory Visit: Payer: Self-pay

## 2021-09-11 ENCOUNTER — Ambulatory Visit: Payer: Medicaid Other

## 2021-09-11 DIAGNOSIS — L209 Atopic dermatitis, unspecified: Secondary | ICD-10-CM

## 2021-09-18 ENCOUNTER — Ambulatory Visit (INDEPENDENT_AMBULATORY_CARE_PROVIDER_SITE_OTHER): Payer: Medicaid Other | Admitting: *Deleted

## 2021-09-18 DIAGNOSIS — L209 Atopic dermatitis, unspecified: Secondary | ICD-10-CM | POA: Diagnosis not present

## 2021-09-20 NOTE — Unmapped (Signed)
Fax received requesting PA for Dupixent 300mg /52mL. PA initiated via [facility] with clinic notes attached.   Received instant approval from plan. Coverage is effective through 03/19/2022

## 2021-09-21 NOTE — Unmapped (Signed)
[facility] Specialty Pharmacy Refill Coordination Note    Specialty Medication(s) to be Shipped:   Inflammatory Disorders: Dupixent    Other medication(s) to be shipped: No additional medications requested for fill at this time     Barry Bond., DOB: 25-Oct-2000  Phone: There are no phone numbers on file.      All above HIPAA information was verified with patient.     Was a Nurse, learning disability used for this call? No    Completed refill call assessment today to schedule patient's medication shipment from the [facility] [phone]).  All relevant notes have been reviewed.     Specialty medication(s) and dose(s) confirmed: Regimen is correct and unchanged.   Changes to medications: Brace reports no changes at this time.  Changes to insurance: No  New side effects reported not previously addressed with a pharmacist or physician: None reported  Questions for the pharmacist: No    Confirmed patient received a Conservation officer, historic buildings and a Surveyor, mining with first shipment. The patient will receive a drug information handout for each medication shipped and additional FDA Medication Guides as required.       DISEASE/MEDICATION-SPECIFIC INFORMATION        For patients on injectable medications: Patient currently has 1 doses left.  Next injection is scheduled for 04/03.    SPECIALTY MEDICATION ADHERENCE     Medication Adherence    Patient reported X missed doses in the last month: 0  Specialty Medication: dupixent 300mg /62ml  Patient is on additional specialty medications: No  Patient is on more than two specialty medications: No  Any gaps in refill history greater than 2 weeks in the last 3 months: no  Demonstrates understanding of importance of adherence: yes  Informant: patient  Reliability of informant: reliable  Provider-estimated medication adherence level: good  Patient is at risk for Non-Adherence: No  Reasons for non-adherence: no problems identified  Support network for adherence: family member  Confirmed plan for next specialty medication refill: delivery by pharmacy  Refills needed for supportive medications: not needed          Refill Coordination    Has the Patients' Contact Information Changed: No  Is the Shipping Address Different: No         Were doses missed due to medication being on hold? No    dupixent 300/2 mg/ml: 7 days of medicine on hand           REFERRAL TO PHARMACIST     Referral to the pharmacist: Not needed      [facility]     Shipping address confirmed in Epic.     Delivery Scheduled: Yes, Expected medication delivery date: 04/05.     Medication will be delivered via UPS to the prescription address in Epic WAM.    Barry Bond   [facility] Specialty Technician

## 2021-09-23 DIAGNOSIS — L209 Atopic dermatitis, unspecified: Principal | ICD-10-CM

## 2021-09-23 MED ORDER — METHOTREXATE SODIUM 2.5 MG TABLET
ORAL_TABLET | ORAL | 0 refills | 0.00000 days
Start: 2021-09-23 — End: ?

## 2021-09-25 ENCOUNTER — Ambulatory Visit (INDEPENDENT_AMBULATORY_CARE_PROVIDER_SITE_OTHER): Payer: Medicaid Other

## 2021-09-25 DIAGNOSIS — L209 Atopic dermatitis, unspecified: Secondary | ICD-10-CM | POA: Diagnosis not present

## 2021-09-25 MED ORDER — METHOTREXATE SODIUM 2.5 MG TABLET
ORAL_TABLET | ORAL | 0 refills | 35.00000 days | Status: CP
Start: 2021-09-25 — End: 2021-11-06

## 2021-09-25 NOTE — Unmapped (Signed)
Refill request for methotrexate.    LOV: 08/22/2021    Upcoming appt: 10/19/2021    Medication pended.

## 2021-09-26 MED FILL — DUPIXENT 300 MG/2 ML SUBCUTANEOUS SYRINGE: SUBCUTANEOUS | 28 days supply | Qty: 8 | Fill #6

## 2021-10-02 ENCOUNTER — Ambulatory Visit: Payer: Medicaid Other

## 2021-10-02 ENCOUNTER — Ambulatory Visit (INDEPENDENT_AMBULATORY_CARE_PROVIDER_SITE_OTHER): Payer: Medicaid Other

## 2021-10-02 DIAGNOSIS — L209 Atopic dermatitis, unspecified: Secondary | ICD-10-CM | POA: Diagnosis not present

## 2021-10-09 ENCOUNTER — Ambulatory Visit (INDEPENDENT_AMBULATORY_CARE_PROVIDER_SITE_OTHER): Payer: Medicaid Other

## 2021-10-09 DIAGNOSIS — L209 Atopic dermatitis, unspecified: Secondary | ICD-10-CM

## 2021-10-10 ENCOUNTER — Ambulatory Visit: Payer: Medicaid Other

## 2021-10-16 ENCOUNTER — Ambulatory Visit (INDEPENDENT_AMBULATORY_CARE_PROVIDER_SITE_OTHER): Payer: Medicaid Other

## 2021-10-16 DIAGNOSIS — L209 Atopic dermatitis, unspecified: Secondary | ICD-10-CM | POA: Diagnosis not present

## 2021-10-17 DIAGNOSIS — L209 Atopic dermatitis, unspecified: Principal | ICD-10-CM

## 2021-10-19 ENCOUNTER — Ambulatory Visit: Admit: 2021-10-19 | Discharge: 2021-10-20 | Payer: BLUE CROSS/BLUE SHIELD

## 2021-10-19 DIAGNOSIS — Z79899 Other long term (current) drug therapy: Principal | ICD-10-CM

## 2021-10-19 DIAGNOSIS — L209 Atopic dermatitis, unspecified: Principal | ICD-10-CM

## 2021-10-19 MED ORDER — FOLIC ACID 1 MG TABLET
ORAL_TABLET | 2 refills | 0 days | Status: CP
Start: 2021-10-19 — End: ?

## 2021-10-19 MED ORDER — DUPIXENT 300 MG/2 ML SUBCUTANEOUS SYRINGE
SUBCUTANEOUS | 11 refills | 28 days | Status: CN
Start: 2021-10-19 — End: 2022-10-19

## 2021-10-19 MED ORDER — METHOTREXATE SODIUM 2.5 MG TABLET
ORAL_TABLET | ORAL | 2 refills | 35 days | Status: CP
Start: 2021-10-19 — End: ?

## 2021-10-19 NOTE — Unmapped (Signed)
Dermatology Note     Assessment and Plan:        Improved still with same options as last time, continue dupix, methot, folic, topicals, zyrtec, option to incr methotrexate but they decline. Recheck labs and fu 2 mo      Atopic Dermatitis, chronic and severe, significantly improved but not at treatment goal, at least 50% BSA  - Has failed numerous previous therapies. Previously treated with pred w/ shown decreased bone density  - Has had an extensive work up for immune deficiency including immunoglobulins, lymphocyte markers, mitogens, vaccine response, genetic testing, and skin biopsy all pointing to normal eczema.  - Continue dupilumab (DUPIXENT) 300 mg/2 mL Syrg injection, every 7 days.  - Continue heavy emollients, prefer cerave and vaseline.  - Continue triamcinolone (KENALOG) 0.1 % ointment; Apply topically Two (2) times a day.  - Continue clobetasoL (TEMOVATE) 0.05 % ointment; Apply topically Two (2) times a day. To thicker areas of rash, avoid face and skin folds  - Continue protopic 0.1% ointment. Apply topically Two (2) times a day to affected areas.  - offered increase in methotrexate vs continuing same dose. Patient and family opted to continue Methotrexate 10 mg weekly with folic acid on days not taking methotrexate. Will consider increase to 15 mg weekly at next visit based on labs and progress     High risk medication - methotrexate  - Quantiferon gold negative 06/20/21  - Labs drawn  08/22/2021 WNL: CBC with diff, AST ALT BUN Cr.  Will repeat today.        The patient was advised to call for an appointment should any new, changing, or symptomatic lesions develop.     RTC: Return in about 2 months (around 12/19/2021). or sooner as needed   _________________________________________________________________    Chief Complaint     Follow up of atopic dermatitis    HPI     Barry Bond. is a 21 y.o. male who presents as a returning patient (last seen by Dr. Katrinka Blazing and Dr. Bennie Hind on 08/22/2021) to Dermatology for follow up of atopic dermatitis. At last visit, patient was to continue applying triamcinolone 0.1% ointment, clobetasol 0.05% ointment, and protopic 0.1% ointment, taking methotrexate 10 mg weekly, and injecting Dupixent every 7 days for the same.    Atopic Dermatitis  - Patient presents with his father who speaks for him.  Father reports about a 80-90% improvement in skin overall with combination of dupixent 300mg  Q 7 days and methotrexate 10mg  Q week.  Labs normal at last check. Patient denies issues sleeping and reports that topicals help with breakthrough itch.  They are happy with the results.  Denies s/e of medications.    The patient denies any other new or changing lesions or areas of concern.     Pertinent Past Medical History     No history of skin cancer    Family History:   Negative for melanoma    Past Medical History, Family History, Social History, Medication List, Allergies, and Problem List were reviewed in the rooming section of Epic.     ROS: Other than symptoms mentioned in the HPI, no fevers, chills, or other skin complaints    Physical Examination     GENERAL: Well-appearing male in no acute distress, resting comfortably.  NEURO: Alert and oriented, answers questions appropriately  PSYCH: Normal mood and affect  SKIN (Waist Up Skin Exam): Per patient request, exam of the scalp, face, eyelids, lips, nose, ears, neck, chest, upper abdomen,  back, arms, hands, and fingernails was performed    Diffuse erythema and dryness to face, neck, trunk, more patchy on arms.  No areas appear lichenified, excoriated, or infected today.    All areas not commented on are within normal limits or unremarkable      (Approved Template 03/07/2020)

## 2021-10-20 NOTE — Unmapped (Signed)
Continue treatment regimen as previously prescribed and obtain labs today.

## 2021-10-23 ENCOUNTER — Ambulatory Visit (INDEPENDENT_AMBULATORY_CARE_PROVIDER_SITE_OTHER): Payer: Medicaid Other | Admitting: *Deleted

## 2021-10-23 DIAGNOSIS — L209 Atopic dermatitis, unspecified: Secondary | ICD-10-CM | POA: Diagnosis not present

## 2021-10-23 NOTE — Unmapped (Signed)
[facility] Specialty Pharmacy Refill Coordination Note    Specialty Medication(s) to be Shipped:   Inflammatory Disorders: Dupixent    Other medication(s) to be shipped:  Clobetasol ointment     Barry Bond., DOB: 24-Dec-2000  Phone: There are no phone numbers on file.      All above HIPAA information was verified with patient's family member, father.     Was a Nurse, learning disability used for this call? No    Completed refill call assessment today to schedule patient's medication shipment from the [facility] [phone]).  All relevant notes have been reviewed.     Specialty medication(s) and dose(s) confirmed: Regimen is correct and unchanged.   Changes to medications: River reports no changes at this time.  Changes to insurance: No  New side effects reported not previously addressed with a pharmacist or physician: None reported  Questions for the pharmacist: No    Confirmed patient received a Conservation officer, historic buildings and a Surveyor, mining with first shipment. The patient will receive a drug information handout for each medication shipped and additional FDA Medication Guides as required.       DISEASE/MEDICATION-SPECIFIC INFORMATION        For patients on injectable medications: Patient currently has 1 doses left.  Next injection is scheduled for 10/23/2021.    SPECIALTY MEDICATION ADHERENCE     Medication Adherence    Patient reported X missed doses in the last month: 0  Specialty Medication: Dupixent  Patient is on additional specialty medications: No  Any gaps in refill history greater than 2 weeks in the last 3 months: no  Demonstrates understanding of importance of adherence: yes  Informant: father  Reliability of informant: reliable  Support network for adherence: family member  Confirmed plan for next specialty medication refill: delivery by pharmacy  Refills needed for supportive medications: not needed                Were doses missed due to medication being on hold? No    dupixent 300/2 mg/ml: 7 days of medicine on hand           REFERRAL TO PHARMACIST     Referral to the pharmacist: Not needed      [facility]     Shipping address confirmed in Epic.     Delivery Scheduled: Yes, Expected medication delivery date: 10/25/2021.     Medication will be delivered via UPS to the prescription address in Epic WAM.    Sadik Piascik D Pairlee Sawtell   [facility] Specialty Technician

## 2021-10-24 MED FILL — DUPIXENT 300 MG/2 ML SUBCUTANEOUS SYRINGE: SUBCUTANEOUS | 28 days supply | Qty: 8 | Fill #7

## 2021-10-24 MED FILL — CLOBETASOL 0.05 % TOPICAL OINTMENT: TOPICAL | 30 days supply | Qty: 120 | Fill #1

## 2021-10-30 ENCOUNTER — Ambulatory Visit (INDEPENDENT_AMBULATORY_CARE_PROVIDER_SITE_OTHER): Payer: Medicaid Other

## 2021-10-30 DIAGNOSIS — L209 Atopic dermatitis, unspecified: Secondary | ICD-10-CM

## 2021-11-06 ENCOUNTER — Ambulatory Visit (INDEPENDENT_AMBULATORY_CARE_PROVIDER_SITE_OTHER): Payer: Medicaid Other

## 2021-11-06 DIAGNOSIS — L209 Atopic dermatitis, unspecified: Secondary | ICD-10-CM | POA: Diagnosis not present

## 2021-11-13 ENCOUNTER — Ambulatory Visit: Payer: Medicaid Other

## 2021-11-14 ENCOUNTER — Ambulatory Visit: Payer: Medicaid Other

## 2021-11-14 ENCOUNTER — Ambulatory Visit (INDEPENDENT_AMBULATORY_CARE_PROVIDER_SITE_OTHER): Payer: Medicaid Other

## 2021-11-14 DIAGNOSIS — L209 Atopic dermatitis, unspecified: Secondary | ICD-10-CM

## 2021-11-16 NOTE — Unmapped (Signed)
[facility] Pharmacy Refill Coordination Note    Specialty Medication(s) to be Shipped:   Inflammatory Disorders: Dupixent 300MG /2ML  Other medication(s) to be shipped: Clobetasol 0.05% Oint     Barry Bond., DOB: [birth year]  Phone: There are no phone numbers on file.    All above HIPAA information was verified with patient's family member, Father, Barry Bond .     Was a Nurse, learning disability used for this call? No    Completed refill call assessment today to schedule patient's medication shipment from the [facility] [phone]).  All relevant notes have been reviewed.     Specialty medication(s) and dose(s) confirmed: Regimen is correct and unchanged.   Changes to medications: [name] reports no changes at this time.  Changes to insurance: No  New side effects reported not previously addressed with a pharmacist or physician: None reported  Questions for the pharmacist: No    Confirmed patient received a Conservation officer, historic buildings and a Surveyor, mining with first shipment. The patient will receive a drug information handout for each medication shipped and additional FDA Medication Guides as required.       DISEASE/MEDICATION-SPECIFIC INFORMATION        For patients on injectable medications: Patient currently has 1 doses left.  Next injection is scheduled for 11/20/2021.    SPECIALTY MEDICATION ADHERENCE     Medication Adherence    Patient reported X missed doses in the last month: 0  Specialty Medication: Dupixent 300mg /43ml Syrg  Patient is on additional specialty medications: No  Patient is on more than two specialty medications: No  Any gaps in refill history greater than 2 weeks in the last 3 months: no  Demonstrates understanding of importance of adherence: yes  Informant: father  Reliability of informant: reliable  Reasons for non-adherence: no problems identified  Support network for adherence: family member  Confirmed plan for next specialty medication refill: delivery by pharmacy  Refills needed for supportive medications: not needed        Were doses missed due to medication being on hold? No    Dupixent 300mg /64ml: 14 days of medicine on hand     REFERRAL TO PHARMACIST     Referral to the pharmacist: Not needed    [facility]     Shipping address confirmed in Epic.     Delivery Scheduled: Yes, Expected medication delivery date: 11/23/2021.     Medication will be delivered via UPS to the prescription address in Epic WAM.    Cannen Dupras P [facility] [place] [facility] Specialty Technician

## 2021-11-22 ENCOUNTER — Ambulatory Visit (INDEPENDENT_AMBULATORY_CARE_PROVIDER_SITE_OTHER): Payer: Medicaid Other

## 2021-11-22 DIAGNOSIS — L209 Atopic dermatitis, unspecified: Secondary | ICD-10-CM | POA: Diagnosis not present

## 2021-11-22 MED FILL — DUPIXENT 300 MG/2 ML SUBCUTANEOUS SYRINGE: SUBCUTANEOUS | 28 days supply | Qty: 8 | Fill #8

## 2021-11-22 MED FILL — CLOBETASOL 0.05 % TOPICAL OINTMENT: TOPICAL | 30 days supply | Qty: 120 | Fill #2

## 2021-11-28 ENCOUNTER — Ambulatory Visit (INDEPENDENT_AMBULATORY_CARE_PROVIDER_SITE_OTHER): Payer: Medicaid Other

## 2021-11-28 DIAGNOSIS — L209 Atopic dermatitis, unspecified: Secondary | ICD-10-CM

## 2021-12-06 ENCOUNTER — Ambulatory Visit (INDEPENDENT_AMBULATORY_CARE_PROVIDER_SITE_OTHER): Payer: Medicaid Other

## 2021-12-06 DIAGNOSIS — L209 Atopic dermatitis, unspecified: Secondary | ICD-10-CM | POA: Diagnosis not present

## 2021-12-13 ENCOUNTER — Ambulatory Visit (INDEPENDENT_AMBULATORY_CARE_PROVIDER_SITE_OTHER): Payer: Medicaid Other

## 2021-12-13 DIAGNOSIS — L209 Atopic dermatitis, unspecified: Secondary | ICD-10-CM

## 2021-12-15 NOTE — Unmapped (Signed)
[facility] Specialty Pharmacy Refill Coordination Note    Specialty Medication(s) to be Shipped:   Inflammatory Disorders: Dupixent 300MG /2ML    Other medication(s) to be shipped: Clobetasol 0.05% Oint     Barry Bond., DOB: 10/06/2000  Phone: There are no phone numbers on file.    All above HIPAA information was verified with patient's family member, Father, Bode .     Was a Nurse, learning disability used for this call? No    Completed refill call assessment today to schedule patient's medication shipment from the [name] [phone]).  All relevant notes have been reviewed.     Specialty medication(s) and dose(s) confirmed: Regimen is correct and unchanged.   Changes to medications: Revel reports no changes at this time.  Changes to insurance: No  New side effects reported not previously addressed with a pharmacist or physician: None reported  Questions for the pharmacist: No    Confirmed patient received a Conservation officer, historic buildings and a Surveyor, mining with first shipment. The patient will receive a drug information handout for each medication shipped and additional FDA Medication Guides as required.       DISEASE/MEDICATION-SPECIFIC INFORMATION        For patients on injectable medications: Patient currently has 1 doses left.  Next injection is scheduled for 12/18/2021.    SPECIALTY MEDICATION ADHERENCE     Medication Adherence    Patient reported X missed doses in the last month: 0  Specialty Medication: Dupixent  Patient is on additional specialty medications: No  Any gaps in refill history greater than 2 weeks in the last 3 months: no  Demonstrates understanding of importance of adherence: yes  Informant: father  Reliability of informant: reliable  Support network for adherence: family member  Confirmed plan for next specialty medication refill: delivery by pharmacy  Refills needed for supportive medications: not needed        Were doses missed due to medication being on hold? No    Dupixent 300mg /40ml: 7 days of medicine on hand     REFERRAL TO PHARMACIST     Referral to the pharmacist: Not needed    [facility]     Shipping address confirmed in Epic.     Delivery Scheduled: Yes, Expected medication delivery date: 12/19/2021.     Medication will be delivered via UPS to the prescription address in Epic WAM.    [name] D [name]   [facility] Specialty Technician

## 2021-12-18 MED FILL — DUPIXENT 300 MG/2 ML SUBCUTANEOUS SYRINGE: SUBCUTANEOUS | 28 days supply | Qty: 8 | Fill #9

## 2021-12-18 MED FILL — CLOBETASOL 0.05 % TOPICAL OINTMENT: TOPICAL | 30 days supply | Qty: 120 | Fill #3

## 2021-12-18 NOTE — Unmapped (Signed)
Dermatology Note     Assessment and Plan:      Atopic Dermatitis, chronic and severe, improved but not at treatment goal, ~30% BSA   - Has failed numerous previous therapies. Previously treated with pred w/ shown decreased bone density  - Has had an extensive work up for immune deficiency including immunoglobulins, lymphocyte markers, mitogens, vaccine response, genetic testing, and skin biopsy all pointing to normal eczema.  - Discussed treatment options including increasing methotrexate to 12.5 mg weekly as well as possibly switching to rinvoq. Jointly elected to increase methotrexate dose.  Plan:  - Increase methotrexate from 10 mg to 12.5 mg (5 tablets) every 7 days  - Continue 1 mg folic acid daily on non methotrexate days  - Continue dupilumab (DUPIXENT) 300 mg/2 mL Syrg injection every 7 days.  - Continue triamcinolone (KENALOG) 0.1 % ointment BID  - Continue clobetasoL (TEMOVATE) 0.05 % ointment BID to thicker areas of rash, avoid face and skin folds  - Continue protopic 0.1% ointment BID  - Continue heavy emollients, prefer cerave and vaseline.     High risk medication - methotrexate  - Quantiferon gold negative 06/20/21  - Labs drawn 08/22/2021 WNL: CBC with diff, AST ALT BUN Cr. Will repeat today. Patient would like results reviewed by phone    The patient was advised to call for an appointment should any new, changing, or symptomatic lesions develop.     RTC: Return in about 2 months (around 02/18/2022) for follow up of eczema. or sooner as needed   _________________________________________________________________      Chief Complaint     Follow up of atopic dermatitis    HPI     Barry Bond. is a 21 y.o. male who presents as a returning patient (last seen by Lottie Dawson, PA on 10/19/2021) to Dermatology for follow up of atopic dermatitis. At last visit,  patient was to continue applying triamcinolone 0.1% ointment, clobetasol 0.05% ointment, and protopic 0.1% ointment, taking methotrexate 10 mg weekly with folic acid on non-methotrexate days, and injecting Dupixent every 7 days for the same. History provided jointly with dad.     Atopic dermatitis  - Notes eczema has been stable since previous visit  - Using methotrexate and dupixent once weekly as well as folic acid on non-methotrexate days  - Applies topical medications few times weekly  - Notes occasional pruritus for his legs, hands, feet, and back  - Denies GI upset or cough    The patient denies any other new or changing lesions or areas of concern.     Pertinent Past Medical History     No history of skin cancer     Family History:   Negative for melanoma     Past Medical History, Family History, Social History, Medication List, Allergies, and Problem List were reviewed in the rooming section of Epic.     ROS: Other than symptoms mentioned in the HPI, no fevers, chills, or other skin complaints    Physical Examination     GENERAL: Well-appearing male in no acute distress, resting comfortably.  NEURO: Alert and oriented, answers questions appropriately  PSYCH: Normal mood and affect  SKIN: Examination of the face, back, bilateral upper extremities, and hands was performed  - Diffuse erythema and dryness to face, neck, trunk, more patchy on arms.  No areas appear lichenified, excoriated, or infected today.  - Slight fissuring of the fingertips    All areas not commented on are within normal limits or unremarkable  Scribe's Attestation: Ethel Rana, PA obtained and performed the history, physical exam and medical decision making elements that were entered into the chart.  Signed by Aggie Cosier, Scribe, on December 19, 2021 at 2:35 PM.    ----------------------------------------------------------------------------------------------------------------------  December 20, 2021 12:46 PM. Documentation assistance provided by the Scribe. I was present during the time the encounter was recorded. The information recorded by the Scribe was done at my direction and has been reviewed and validated by me.  ----------------------------------------------------------------------------------------------------------------------      (Approved Template 03/07/2020)

## 2021-12-19 ENCOUNTER — Ambulatory Visit: Admit: 2021-12-19 | Discharge: 2021-12-20 | Payer: BLUE CROSS/BLUE SHIELD

## 2021-12-19 DIAGNOSIS — L209 Atopic dermatitis, unspecified: Principal | ICD-10-CM

## 2021-12-19 DIAGNOSIS — Z79899 Other long term (current) drug therapy: Principal | ICD-10-CM

## 2021-12-19 MED ORDER — METHOTREXATE SODIUM 2.5 MG TABLET
ORAL_TABLET | ORAL | 3 refills | 35 days | Status: CP
Start: 2021-12-19 — End: ?

## 2021-12-19 NOTE — Unmapped (Addendum)
Please have your labs drawn as soon as possible. A list of locations is below.     Injection:   - Dupixent once weekly    Orally   - Methotrexate once weekly (5 tablets)   - Take folic acid on non-methotrexate days    Continue your prescribed topicals:   - triamcinolone   - protopic   - To thicker areas of rash: clobetasol     Please have your labs drawn at any of the following facilities below:    [facility]   [facility] [place]  [phone]       [phone]  [address]     [address]  [place], [place] [address]      [facility] [place], [address]  7 am to 6 pm, Monday to Friday    [place], [place] [address]  8 am to 1 pm, Saturday & Sunday    7:30 am to 5 pm, Monday to Friday          Closed Saturday & Sunday    [facility] [place] [facility]   [facility] [place]  [phone]       [phone]  [address] [place] [address]     [address]  [facility] [place], [address]    [facility] [place], [address]  [place], [place] [address]      [place], [place] [address]  7:30 am to 6 pm, Monday to Thursday   7 am to 5 pm. Monday to Friday  8 am to 5 pm, Friday      Closed Saturday & Sunday  Closed Saturday & Sunday      [facility] [place]   [facility] [place]  [phone]       [phone]  [address]     [address] [place] [address]  [address]       [address]  [place], [place] [address]      [place], [place] [address]  7:30 am to 5 pm, Monday to Friday    8 am to 4:30 pm, Monday to Friday  Closed Saturday & Sunday     Closed Saturday & Sunday      [facility] [place]   [facility] [place]  [phone]       [phone]  [address]     [address]  [employer], [address]     [address]  [place], [place] [address]      [place], [place] [address]  7:30 am to 5 pm, Monday to Friday    7:30 am to 4 pm, Monday to Thursday  Closed Saturday & Sunday     7:30 am to 2:30 pm, Friday          Closed Saturday & Sunday   [facility]  [phone]  [address]  [facility] [place], [address]  [place], [place] [address]  7:30 am to 5 pm, Monday to Friday  Closed Saturday & Sunday

## 2021-12-20 ENCOUNTER — Ambulatory Visit (INDEPENDENT_AMBULATORY_CARE_PROVIDER_SITE_OTHER): Payer: Medicaid Other

## 2021-12-20 DIAGNOSIS — L209 Atopic dermatitis, unspecified: Secondary | ICD-10-CM

## 2021-12-27 ENCOUNTER — Ambulatory Visit: Payer: Medicaid Other

## 2021-12-29 ENCOUNTER — Ambulatory Visit (INDEPENDENT_AMBULATORY_CARE_PROVIDER_SITE_OTHER): Payer: Medicaid Other

## 2021-12-29 DIAGNOSIS — L209 Atopic dermatitis, unspecified: Secondary | ICD-10-CM | POA: Diagnosis not present

## 2022-01-08 ENCOUNTER — Ambulatory Visit (INDEPENDENT_AMBULATORY_CARE_PROVIDER_SITE_OTHER): Payer: Medicaid Other

## 2022-01-08 DIAGNOSIS — L209 Atopic dermatitis, unspecified: Secondary | ICD-10-CM

## 2022-01-11 NOTE — Unmapped (Signed)
[facility] Refill Coordination Note    Specialty Medication(s) to be Shipped:   Inflammatory Disorders: Dupixent    Other medication(s) to be shipped: No additional medications requested for fill at this time     Barry Bond., DOB: 2001-03-20  Phone: There are no phone numbers on file.      All above HIPAA information was verified with patient.     Was a Nurse, learning disability used for this call? No    Completed refill call assessment today to schedule patient's medication shipment from the [facility] [phone]).  All relevant notes have been reviewed.     Specialty medication(s) and dose(s) confirmed: Regimen is correct and unchanged.   Changes to medications: [name] reports no changes at this time.  Changes to insurance: No  New side effects reported not previously addressed with a pharmacist or physician: None reported  Questions for the pharmacist: No    Confirmed patient received a Conservation officer, historic buildings and a Surveyor, mining with first shipment. The patient will receive a drug information handout for each medication shipped and additional FDA Medication Guides as required.       DISEASE/MEDICATION-SPECIFIC INFORMATION        For patients on injectable medications: Patient currently has 1 doses left.  Next injection is scheduled for 01/15/22.    SPECIALTY MEDICATION ADHERENCE     Medication Adherence    Patient reported X missed doses in the last month: 0  Specialty Medication: DUPIXENT  Patient is on additional specialty medications: No  Support network for adherence: family member              Were doses missed due to medication being on hold? No    Dupixent 300/2 mg/ml: 4 days of medicine on hand        REFERRAL TO PHARMACIST     Referral to the pharmacist: Not needed      [facility]     Shipping address confirmed in Epic.     Delivery Scheduled: Yes, Expected medication delivery date: 01/17/22.     Medication will be delivered via UPS to the prescription address in Epic WAM.    Unk Lightning   [facility] Specialty Technician

## 2022-01-15 ENCOUNTER — Ambulatory Visit: Payer: Medicaid Other

## 2022-01-16 MED FILL — CLOBETASOL 0.05 % TOPICAL OINTMENT: TOPICAL | 30 days supply | Qty: 120 | Fill #4

## 2022-01-16 MED FILL — DUPIXENT 300 MG/2 ML SUBCUTANEOUS SYRINGE: SUBCUTANEOUS | 28 days supply | Qty: 8 | Fill #10

## 2022-01-18 DIAGNOSIS — L209 Atopic dermatitis, unspecified: Principal | ICD-10-CM

## 2022-01-18 MED ORDER — METHOTREXATE SODIUM 2.5 MG TABLET
ORAL_TABLET | ORAL | 2 refills | 0 days
Start: 2022-01-18 — End: ?

## 2022-01-19 MED ORDER — METHOTREXATE SODIUM 2.5 MG TABLET
ORAL_TABLET | ORAL | 0 refills | 49 days | Status: CP
Start: 2022-01-19 — End: ?

## 2022-01-24 ENCOUNTER — Ambulatory Visit (INDEPENDENT_AMBULATORY_CARE_PROVIDER_SITE_OTHER): Payer: Medicaid Other

## 2022-01-24 DIAGNOSIS — L209 Atopic dermatitis, unspecified: Secondary | ICD-10-CM

## 2022-01-31 ENCOUNTER — Ambulatory Visit (INDEPENDENT_AMBULATORY_CARE_PROVIDER_SITE_OTHER): Payer: Medicaid Other

## 2022-01-31 DIAGNOSIS — L209 Atopic dermatitis, unspecified: Secondary | ICD-10-CM | POA: Diagnosis not present

## 2022-02-06 ENCOUNTER — Ambulatory Visit (INDEPENDENT_AMBULATORY_CARE_PROVIDER_SITE_OTHER): Payer: Medicaid Other

## 2022-02-06 DIAGNOSIS — L209 Atopic dermatitis, unspecified: Secondary | ICD-10-CM | POA: Diagnosis not present

## 2022-02-09 NOTE — Unmapped (Signed)
[facility] Special[facility] Pharmacy Refill Coordination Note    Specialty Medication(s) to be Shipped:   Inflammatory Disorders: Dupixent    Other medication(s) to be shipped: No additional medications requested for fill at this time     Barry Bond., DOB: 2000/10/02  Phone: There are no phone numbers on file.      All above HIPAA information was verified with patient's family member, father.     Was a Nurse, learning disability used for this call? No    Completed refill call assessment today to schedule patient's medication shipment from the [facility] [phone]).  All relevant notes have been reviewed.     Specialty medication(s) and dose(s) confirmed: Regimen is correct and unchanged.   Changes to medications: [name] reports no changes at this time.  Changes to insurance: No  New side effects reported not previously addressed with a pharmacist or physician: None reported  Questions for the pharmacist: No    Confirmed patient received a Conservation officer, historic buildings and a Surveyor, mining with first shipment. The patient will receive a drug information handout for each medication shipped and additional FDA Medication Guides as required.       DISEASE/MEDICATION-SPECIFIC INFORMATION        For patients on injectable medications: Patient currently has 1 doses left.  Next injection is scheduled for 02/12/2022.    SPECIALTY MEDICATION ADHERENCE     Medication Adherence    Patient reported X missed doses in the last month: 0  Specialty Medication: Dupixent  Patient is on additional specialty medications: No  Any gaps in refill history greater than 2 weeks in the last 3 months: no  Demonstrates understanding of importance of adherence: yes  Informant: father  Reliability of informant: reliable          Support network for adherence: family member      Confirmed plan for next specialty medication refill: delivery by pharmacy  Refills needed for supportive medications: not needed              Were doses missed due to medication being on hold? No    Dupixent 300/2 mg/ml: 7 days of medicine on hand        REFERRAL TO PHARMACIST     Referral to the pharmacist: Not needed      [facility]     Shipping address confirmed in Epic.     Delivery Scheduled: Yes, Expected medication delivery date: 02/14/2022.     Medication will be delivered via UPS to the prescription address in Epic WAM.    Valere Dross   [facility] Specialty Technician

## 2022-02-12 DIAGNOSIS — L209 Atopic dermatitis, unspecified: Principal | ICD-10-CM

## 2022-02-12 MED ORDER — METHOTREXATE SODIUM 2.5 MG TABLET
ORAL_TABLET | ORAL | 1 refills | 0 days
Start: 2022-02-12 — End: ?

## 2022-02-13 ENCOUNTER — Ambulatory Visit: Payer: Medicaid Other

## 2022-02-13 MED ORDER — METHOTREXATE SODIUM 2.5 MG TABLET
ORAL_TABLET | ORAL | 0 refills | 105 days | Status: CP
Start: 2022-02-13 — End: ?

## 2022-02-13 MED FILL — DUPIXENT 300 MG/2 ML SUBCUTANEOUS SYRINGE: SUBCUTANEOUS | 28 days supply | Qty: 8 | Fill #11

## 2022-02-13 NOTE — Unmapped (Signed)
90 day supply dispensed #75

## 2022-02-14 ENCOUNTER — Ambulatory Visit (INDEPENDENT_AMBULATORY_CARE_PROVIDER_SITE_OTHER): Payer: Medicaid Other

## 2022-02-14 DIAGNOSIS — L209 Atopic dermatitis, unspecified: Secondary | ICD-10-CM | POA: Diagnosis not present

## 2022-02-14 MED ORDER — DUPIXENT 300 MG/2 ML SUBCUTANEOUS SYRINGE
SUBCUTANEOUS | 11 refills | 28 days
Start: 2022-02-14 — End: 2023-02-14

## 2022-02-14 NOTE — Unmapped (Signed)
Prescription refill request for dupixent 300mg /82ml, Last office visit was 12/19/21    Please Advise

## 2022-02-15 DIAGNOSIS — L209 Atopic dermatitis, unspecified: Principal | ICD-10-CM

## 2022-02-15 MED ORDER — DUPIXENT 300 MG/2 ML SUBCUTANEOUS SYRINGE
SUBCUTANEOUS | 9 refills | 28.00000 days | Status: CP
Start: 2022-02-15 — End: 2023-02-15
  Filled 2022-03-13: qty 8, 28d supply, fill #0

## 2022-02-20 ENCOUNTER — Ambulatory Visit: Admit: 2022-02-20 | Discharge: 2022-02-21 | Payer: BLUE CROSS/BLUE SHIELD

## 2022-02-20 DIAGNOSIS — Z79899 Other long term (current) drug therapy: Principal | ICD-10-CM

## 2022-02-20 DIAGNOSIS — L209 Atopic dermatitis, unspecified: Principal | ICD-10-CM

## 2022-02-20 LAB — CBC W/ AUTO DIFF
BASOPHILS ABSOLUTE COUNT: 0.1 10*9/L (ref 0.0–0.1)
BASOPHILS RELATIVE PERCENT: 0.7 %
EOSINOPHILS ABSOLUTE COUNT: 0.8 10*9/L — ABNORMAL HIGH (ref 0.0–0.5)
EOSINOPHILS RELATIVE PERCENT: 6.7 %
HEMATOCRIT: 46.1 % (ref 39.0–48.0)
HEMOGLOBIN: 15.3 g/dL (ref 12.9–16.5)
LYMPHOCYTES ABSOLUTE COUNT: 1.8 10*9/L (ref 1.1–3.6)
LYMPHOCYTES RELATIVE PERCENT: 15.6 %
MEAN CORPUSCULAR HEMOGLOBIN CONC: 33.1 g/dL (ref 32.0–36.0)
MEAN CORPUSCULAR HEMOGLOBIN: 28.8 pg (ref 25.9–32.4)
MEAN CORPUSCULAR VOLUME: 86.9 fL (ref 77.6–95.7)
MEAN PLATELET VOLUME: 9.2 fL (ref 6.8–10.7)
MONOCYTES ABSOLUTE COUNT: 0.6 10*9/L (ref 0.3–0.8)
MONOCYTES RELATIVE PERCENT: 5.1 %
NEUTROPHILS ABSOLUTE COUNT: 8.1 10*9/L — ABNORMAL HIGH (ref 1.8–7.8)
NEUTROPHILS RELATIVE PERCENT: 71.9 %
PLATELET COUNT: 321 10*9/L (ref 150–450)
RED BLOOD CELL COUNT: 5.31 10*12/L (ref 4.26–5.60)
RED CELL DISTRIBUTION WIDTH: 13.4 % (ref 12.2–15.2)
WBC ADJUSTED: 11.3 10*9/L — ABNORMAL HIGH (ref 3.6–11.2)

## 2022-02-20 LAB — BUN: BLOOD UREA NITROGEN: 11 mg/dL (ref 9–23)

## 2022-02-20 LAB — CREATININE
CREATININE: 0.52 mg/dL — ABNORMAL LOW
EGFR CKD-EPI (2021) MALE: >90 mL/min/{1.73_m2} (ref >=60)

## 2022-02-20 LAB — AST: AST (SGOT): 23 U/L (ref <=34)

## 2022-02-20 LAB — ALT: ALT (SGPT): 38 U/L (ref 10–49)

## 2022-02-20 MED ORDER — FOLIC ACID 1 MG TABLET
ORAL_TABLET | 3 refills | 0 days | Status: CP
Start: 2022-02-20 — End: ?

## 2022-02-20 MED ORDER — METHOTREXATE SODIUM 2.5 MG TABLET
ORAL_TABLET | ORAL | 5 refills | 35 days | Status: CP
Start: 2022-02-20 — End: ?

## 2022-02-20 NOTE — Unmapped (Unsigned)
Dermatology Note     Assessment and Plan:        Atopic Dermatitis, chronic and severe, improved but not at treatment goal, ~30% BSA   - Has failed numerous previous therapies. Previously treated with pred w/ shown decreased bone density  - Has had an extensive work up for immune deficiency including immunoglobulins, lymphocyte markers, mitogens, vaccine response, genetic testing, and skin biopsy all pointing to normal eczema.  - Discussed treatment options including increasing methotrexate to 12.5 mg weekly as well as possibly switching to rinvoq. Jointly elected to increase methotrexate dose.  Plan:  - Increase methotrexate from 12.5 mg to 15 mg (6 tablets) every 7 days  - Continue 1 mg folic acid daily on non methotrexate days  - Continue dupilumab (DUPIXENT) 300 mg/2 mL Syrg injection every 7 days.  - Continue triamcinolone (KENALOG) 0.1 % ointment BID  - Continue clobetasoL (TEMOVATE) 0.05 % ointment BID to thicker areas of rash, avoid face and skin folds  - Continue protopic 0.1% ointment BID  - Continue heavy emollients, prefer cerave and vaseline.     High risk medication - methotrexate  - Quantiferon gold negative 06/20/21  - Labs drawn 08/22/2021 WNL: CBC with diff, AST ALT BUN Cr.  Recheck today. Patient would like results reviewed by phone.       The patient was advised to call for an appointment should any new, changing, or symptomatic lesions develop.     RTC: Return in about 6 months (around 08/23/2022) for eczema fu. or sooner as needed   _________________________________________________________________      Chief Complaint     Chief Complaint   Patient presents with    Eczema     Follow up; pt reports improvement       HPI     Barry Stuedemann. is a 21 y.o. male who presents as a returning patient (last seen by Lottie Dawson, PA on 12/19/2021) to Dermatology for follow up of atopic dermatitis. At last visit, patient was seen to increase methotrexate to 12.5 mg weekly, continue 1 mg folic acid, improvement       HPI     Barry Hogland. is a 21 y.o. male who presents as a returning patient (last seen by Lottie Dawson, PA on 12/19/2021) to Dermatology for follow up of atopic dermatitis. At last visit, patient was seen to increase methotrexate to 12.5 mg weekly, continue 1 mg folic acid, dupixent weekly, triamcinolone ointment BID, clobetasol ointment BID, and protopic ointment BID.    Today, patient reports ***    The patient denies any other new or changing lesions or areas of concern.     Pertinent Past Medical History     No history of skin cancer    Family History:   Negative for melanoma    Past Medical History, Family History, Social History, Medication List, Allergies, and Problem List were reviewed in the rooming section of Epic.     ROS: Other than symptoms mentioned in the HPI, no fevers, chills, or other skin complaints    Physical Examination     GENERAL: Well-appearing male in no acute distress, resting comfortably.  NEURO: Alert and oriented, answers questions appropriately  PSYCH: Normal mood and affect  SKIN: Examination of the {derm skin check PE skin list:75420} was performed  {PE list:75421}  ***    All areas not commented on are within normal limits or unremarkable    Scribe's Attestation: Lottie Dawson, PA obtained and performed the history,  physical exam and medical decision making elements that were entered into the chart. Signed by Sander Nephew, Scribe, on ***.    {*** NOTE TO PROVIDER: PLEASE ADD ATTESTATION NOTING YOU AGREE WITH SCRIBE DOCUMENTATION}    (Approved Template 03/07/2020)

## 2022-02-21 ENCOUNTER — Ambulatory Visit (INDEPENDENT_AMBULATORY_CARE_PROVIDER_SITE_OTHER): Payer: Medicaid Other

## 2022-02-21 DIAGNOSIS — L209 Atopic dermatitis, unspecified: Secondary | ICD-10-CM | POA: Diagnosis not present

## 2022-02-27 ENCOUNTER — Telehealth: Payer: Self-pay

## 2022-02-27 NOTE — Telephone Encounter (Deleted)
Patient has been approved for [facility] patient assistance via [employer] until 02/24/23.  Medication will be shipped at no cost to patient to their home address of [address] [place] [address].  Questions, [employer]ME [phone]. Initial rx is for 1 month supply with 0 additional refills.

## 2022-02-27 NOTE — Telephone Encounter (Signed)
Created in error

## 2022-02-28 ENCOUNTER — Ambulatory Visit (INDEPENDENT_AMBULATORY_CARE_PROVIDER_SITE_OTHER): Payer: Medicaid Other

## 2022-02-28 DIAGNOSIS — L209 Atopic dermatitis, unspecified: Secondary | ICD-10-CM

## 2022-03-07 ENCOUNTER — Ambulatory Visit: Payer: Medicaid Other

## 2022-03-07 ENCOUNTER — Ambulatory Visit (INDEPENDENT_AMBULATORY_CARE_PROVIDER_SITE_OTHER): Payer: Medicaid Other

## 2022-03-07 DIAGNOSIS — L209 Atopic dermatitis, unspecified: Secondary | ICD-10-CM | POA: Diagnosis not present

## 2022-03-08 NOTE — Unmapped (Signed)
[facility] Specialty Pharmacy Refill Coordination Note    Specialty Medication(s) to be Shipped:   Inflammatory Disorders: Dupixent    Other medication(s) to be shipped: Clobetasol ointment     Barry Bond., DOB: 2001-01-16  Phone: There are no phone numbers on file.      All above HIPAA information was verified with patient's family member, father.     Was a Nurse, learning disability used for this call? No    Completed refill call assessment today to schedule patient's medication shipment from the [facility] [phone]).  All relevant notes have been reviewed.     Specialty medication(s) and dose(s) confirmed: Regimen is correct and unchanged.   Changes to medications: [name] reports no changes at this time.  Changes to insurance: No  New side effects reported not previously addressed with a pharmacist or physician: None reported  Questions for the pharmacist: No    Confirmed patient received a Conservation officer, historic buildings and a Surveyor, mining with first shipment. The patient will receive a drug information handout for each medication shipped and additional FDA Medication Guides as required.       DISEASE/MEDICATION-SPECIFIC INFORMATION        For patients on injectable medications: Patient currently has 1 doses left.  Next injection is scheduled for 03/12/2022.    SPECIALTY MEDICATION ADHERENCE     Medication Adherence    Patient reported X missed doses in the last month: 0  Specialty Medication: Dupixent  Patient is on additional specialty medications: No  Any gaps in refill history greater than 2 weeks in the last 3 months: no  Demonstrates understanding of importance of adherence: yes  Informant: father  Reliability of informant: reliable          Support network for adherence: family member      Confirmed plan for next specialty medication refill: delivery by pharmacy  Refills needed for supportive medications: not needed              Were doses missed due to medication being on hold? No    Dupixent 300/2 mg/ml: 7 days of medicine on hand        REFERRAL TO PHARMACIST     Referral to the pharmacist: Not needed      [facility]     Shipping address confirmed in Epic.     Delivery Scheduled: Yes, Expected medication delivery date: 03/14/2022.     Medication will be delivered via UPS to the prescription address in Epic WAM.    Barry Bond   [facility] Specialty Technician

## 2022-03-13 MED FILL — CLOBETASOL 0.05 % TOPICAL OINTMENT: TOPICAL | 30 days supply | Qty: 120 | Fill #5

## 2022-03-16 ENCOUNTER — Ambulatory Visit: Payer: Medicaid Other

## 2022-03-20 ENCOUNTER — Ambulatory Visit (INDEPENDENT_AMBULATORY_CARE_PROVIDER_SITE_OTHER): Payer: Medicaid Other

## 2022-03-20 DIAGNOSIS — L209 Atopic dermatitis, unspecified: Secondary | ICD-10-CM | POA: Diagnosis not present

## 2022-03-26 ENCOUNTER — Ambulatory Visit (INDEPENDENT_AMBULATORY_CARE_PROVIDER_SITE_OTHER): Payer: Medicaid Other

## 2022-03-26 DIAGNOSIS — L209 Atopic dermatitis, unspecified: Secondary | ICD-10-CM

## 2022-03-26 MED ORDER — METHOTREXATE SODIUM 2.5 MG TABLET
ORAL_TABLET | 0 refills | 0 days | Status: CP
Start: 2022-03-26 — End: ?

## 2022-04-02 NOTE — Unmapped (Signed)
[facility] Refill Coordination Note    Specialty Medication(s) to be Shipped:   Inflammatory Disorders: Dupixent    Other medication(s) to be shipped: No additional medications requested for fill at this time     Barry Kil., DOB: 27-Nov-2000  Phone: There are no phone numbers on file.      All above HIPAA information was verified with patient's family member, dad.     Was a Nurse, learning disability used for this call? No    Completed refill call assessment today to schedule patient's medication shipment from the [facility] [phone]).  All relevant notes have been reviewed.     Specialty medication(s) and dose(s) confirmed: Regimen is correct and unchanged.   Changes to medications: Barry Bond reports no changes at this time.  Changes to insurance: No  New side effects reported not previously addressed with a pharmacist or physician: None reported  Questions for the pharmacist: No    Confirmed patient received a Conservation officer, historic buildings and a Surveyor, mining with first shipment. The patient will receive a drug information handout for each medication shipped and additional FDA Medication Guides as required.       DISEASE/MEDICATION-SPECIFIC INFORMATION        For patients on injectable medications: Patient currently has 1 doses left.  Next injection is scheduled for 10/10.    SPECIALTY MEDICATION ADHERENCE     Medication Adherence    Patient reported X missed doses in the last month: 0  Specialty Medication: Dupixent  Patient is on additional specialty medications: No  Patient is on more than two specialty medications: No  Any gaps in refill history greater than 2 weeks in the last 3 months: no  Demonstrates understanding of importance of adherence: yes  Informant: father  Reliability of informant: reliable  Provider-estimated medication adherence level: good  Patient is at risk for Non-Adherence: No  Reasons for non-adherence: no problems identified            Support network for adherence: family member      Confirmed plan for next specialty medication refill: delivery by pharmacy  Refills needed for supportive medications: not needed          Refill Coordination    Has the Patients' Contact Information Changed: No  Is the Shipping Address Different: No         Were doses missed due to medication being on hold? No    dupixent 300/2 mg/ml: 7 days of medicine on hand       REFERRAL TO PHARMACIST     Referral to the pharmacist: Not needed      [facility]     Shipping address confirmed in Epic.     Delivery Scheduled: Yes, Expected medication delivery date: 10/13.     Medication will be delivered via UPS to the prescription address in Epic WAM.    [name]   [facility] Specialty Technician

## 2022-04-03 ENCOUNTER — Ambulatory Visit: Payer: Medicaid Other

## 2022-04-04 ENCOUNTER — Ambulatory Visit (INDEPENDENT_AMBULATORY_CARE_PROVIDER_SITE_OTHER): Payer: Medicaid Other

## 2022-04-04 DIAGNOSIS — L209 Atopic dermatitis, unspecified: Secondary | ICD-10-CM

## 2022-04-05 MED FILL — DUPIXENT 300 MG/2 ML SUBCUTANEOUS SYRINGE: SUBCUTANEOUS | 28 days supply | Qty: 8 | Fill #1

## 2022-04-11 ENCOUNTER — Ambulatory Visit (INDEPENDENT_AMBULATORY_CARE_PROVIDER_SITE_OTHER): Payer: Medicaid Other

## 2022-04-11 DIAGNOSIS — L209 Atopic dermatitis, unspecified: Secondary | ICD-10-CM | POA: Diagnosis not present

## 2022-04-17 ENCOUNTER — Ambulatory Visit: Payer: Medicaid Other

## 2022-04-20 ENCOUNTER — Ambulatory Visit (INDEPENDENT_AMBULATORY_CARE_PROVIDER_SITE_OTHER): Payer: Medicaid Other

## 2022-04-20 DIAGNOSIS — L209 Atopic dermatitis, unspecified: Secondary | ICD-10-CM

## 2022-04-26 ENCOUNTER — Ambulatory Visit (INDEPENDENT_AMBULATORY_CARE_PROVIDER_SITE_OTHER): Payer: Medicaid Other

## 2022-04-26 DIAGNOSIS — L209 Atopic dermatitis, unspecified: Secondary | ICD-10-CM

## 2022-05-01 NOTE — Unmapped (Signed)
[facility] Specialty Pharmacy Refill Coordination Note    Specialty Medication(s) to be Shipped:   Inflammatory Disorders: Dupixent    Other medication(s) to be shipped: Clobetasol ointment     Barry Bond., DOB: 2001/02/03  Phone: There are no phone numbers on file.      All above HIPAA information was verified with patient's family member, father.     Was a Nurse, learning disability used for this call? No    Completed refill call assessment today to schedule patient's medication shipment from the [facility] [phone]).  All relevant notes have been reviewed.     Specialty medication(s) and dose(s) confirmed: Regimen is correct and unchanged.   Changes to medications: Barry Bond reports no changes at this time.  Changes to insurance: No  New side effects reported not previously addressed with a pharmacist or physician: None reported  Questions for the pharmacist: No    Confirmed patient received a Conservation officer, historic buildings and a Surveyor, mining with first shipment. The patient will receive a drug information handout for each medication shipped and additional FDA Medication Guides as required.       DISEASE/MEDICATION-SPECIFIC INFORMATION        For patients on injectable medications: Patient currently has 1 doses left.  Next injection is scheduled for 11/7.    SPECIALTY MEDICATION ADHERENCE     Medication Adherence    Patient reported X missed doses in the last month: 0  Specialty Medication: Dupixent  Patient is on additional specialty medications: No  Any gaps in refill history greater than 2 weeks in the last 3 months: no  Demonstrates understanding of importance of adherence: yes  Informant: father  Reliability of informant: reliable          Support network for adherence: family member      Confirmed plan for next specialty medication refill: delivery by pharmacy  Refills needed for supportive medications: not needed              Were doses missed due to medication being on hold? No    Dupixent 300/2 mg/ml: 7 days of medicine on hand        REFERRAL TO PHARMACIST     Referral to the pharmacist: Not needed      [facility]     Shipping address confirmed in Epic.     Delivery Scheduled: Yes, Expected medication delivery date: 11/9.     Medication will be delivered via UPS to the prescription address in Epic WAM.    Valere Dross   [facility] Specialty Technician

## 2022-05-02 ENCOUNTER — Ambulatory Visit (INDEPENDENT_AMBULATORY_CARE_PROVIDER_SITE_OTHER): Payer: Medicaid Other

## 2022-05-02 DIAGNOSIS — L209 Atopic dermatitis, unspecified: Secondary | ICD-10-CM

## 2022-05-02 MED FILL — CLOBETASOL 0.05 % TOPICAL OINTMENT: TOPICAL | 30 days supply | Qty: 120 | Fill #6

## 2022-05-02 MED FILL — DUPIXENT 300 MG/2 ML SUBCUTANEOUS SYRINGE: SUBCUTANEOUS | 28 days supply | Qty: 8 | Fill #2

## 2022-05-08 ENCOUNTER — Ambulatory Visit: Payer: Medicaid Other

## 2022-05-08 ENCOUNTER — Ambulatory Visit (INDEPENDENT_AMBULATORY_CARE_PROVIDER_SITE_OTHER): Payer: Medicaid Other

## 2022-05-08 DIAGNOSIS — L209 Atopic dermatitis, unspecified: Secondary | ICD-10-CM

## 2022-05-14 ENCOUNTER — Ambulatory Visit (INDEPENDENT_AMBULATORY_CARE_PROVIDER_SITE_OTHER): Payer: Medicaid Other

## 2022-05-14 DIAGNOSIS — L209 Atopic dermatitis, unspecified: Secondary | ICD-10-CM | POA: Diagnosis not present

## 2022-05-21 NOTE — Unmapped (Unsigned)
Patient reports no flares in the past month and tolerating Dupixent well.    [facility] Clinical Assessment & Refill Coordination Note    Barry Dobrin., DOB: 06/03/2001  Phone: There are no phone numbers on file.    All above HIPAA information was verified with patient.     Was a Nurse, learning disability used for this call? No    Specialty Medication(s):   Inflammatory Disorders: Dupixent     Current Outpatient Medications   Medication Sig Dispense Refill    calcium carbonate-vitamin D3 600 mg(1,500mg ) -800 unit Tab TAKE 1 TABLET BY MOUTH EVERY DAY 90 tablet 3    cetirizine (ZYRTEC) 10 MG tablet 1 tablet once a day if needed for runny nose or itchy eyes      clobetasoL (TEMOVATE) 0.05 % ointment Apply topically Two (2) times a day. 120 g 11    dupilumab (DUPIXENT SYRINGE) 300 mg/2 mL Syrg injection Inject the contents of 1 syringe (300 mg total) under the skin every seven (7) days. 8 mL 9    EPINEPHrine (EPIPEN) 0.3 mg/0.3 mL injection Inject 0.3 mL (0.3 mg total) into the muscle once. for 1 dose In case of anaphylaxis 2 each 3    folic acid (FOLVITE) 1 MG tablet Please take 1 mg on days that you are not taking Methotrexate 90 tablet 3    methotrexate 2.5 MG tablet TAKE 6 TABLETS (15 MG) BY MOUTH EVERY 7 DAYS 72 tablet 0     No current facility-administered medications for this visit.        Changes to medications: Arol reports no changes at this time.    Allergies   Allergen Reactions    Benadryl [Diphenhydramine Hcl] Swelling    Peanut Swelling and Other (See Comments)     Face    Peanut Oil Anaphylaxis     Pt has an epi pen  Pt has an epi pen    Soy Anaphylaxis    Cetirizine Other (See Comments)     Other reaction(s): Unknown    Cyproheptadine Other (See Comments)     Other Reaction: Other reaction    Fish Containing Products Other (See Comments)     Unknown  Other reaction(s): UNKNOWN    Hydroxyzine Other (See Comments)    Lentils     Shellfish Containing Products Cough Unknown    Nickel Rash       Changes to allergies: No    SPECIALTY MEDICATION ADHERENCE     Dupxient 300 mg: 16 days of medicine on hand       Medication Adherence    Patient reported X missed doses in the last month: 0  Specialty Medication: Dupixent  Informant: patient            Support network for adherence: family member                Specialty medication(s) dose(s) confirmed: Regimen is correct and unchanged.     Are there any concerns with adherence? No    Adherence counseling provided? Not needed    CLINICAL MANAGEMENT AND INTERVENTION      Clinical Benefit Assessment:    Do you feel the medicine is effective or helping your condition? Yes    Clinical Benefit counseling provided? Not needed    Adverse Effects Assessment:    Are you experiencing any side effects? No    Are you experiencing difficulty administering your medicine? No    Quality of Life Assessment:  Quality of Life    Rheumatology  Oncology  Dermatology  1. What impact has your specialty medication had on the symptoms of your skin condition (i.e. itchiness, soreness, stinging)?: Tremendous  2. What impact has your specialty medication had on your comfort level with your skin?: Tremendous  Cystic Fibrosis          How many days over the past month did your AD  keep you from your normal activities? For example, brushing your teeth or getting up in the morning. 0    Have you discussed this with your provider? Not needed    Acute Infection Status:    Acute infections noted within Epic:  No active infections  Patient reported infection: None    Therapy Appropriateness:    Is therapy appropriate and patient progressing towards therapeutic goals? Yes, therapy is appropriate and should be continued    DISEASE/MEDICATION-SPECIFIC INFORMATION      For patients on injectable medications: Patient currently has 2 doses left.  Next injection is scheduled for 05/23/22.    Chronic Inflammatory Diseases: Have you experienced any flares in the last month? No    PATIENT SPECIFIC NEEDS     Does the patient have any physical, cognitive, or cultural barriers? No    Is the patient high risk? No    Did the patient require a clinical intervention? No    Does the patient require physician intervention or other additional services (i.e., nutrition, smoking cessation, social work)? No    SOCIAL DETERMINANTS OF HEALTH     At the [facility], we have learned that life circumstances - like trouble affording food, housing, utilities, or transportation can affect the health of many of our patients.   That is why we wanted to ask: are you currently experiencing any life circumstances that are negatively impacting your health and/or quality of life? Did not assess    Social Determinants of Health     Financial Resource Strain: Not on file   Internet Connectivity: Not on file   Food Insecurity: Not on file   Tobacco Use: Low Risk  (02/21/2022)    Patient History     Smoking Tobacco Use: Never     Smokeless Tobacco Use: Never     Passive Exposure: Not on file   Housing/Utilities: Unknown (10/18/2021)    Housing/Utilities     Within the past 12 months, have you ever stayed: outside, in a car, in a tent, in an overnight shelter, or temporarily in someone else's home (i.e. couch-surfing)?: No     Are you worried about losing your housing?: Not on file     Within the past 12 months, have you been unable to get utilities (heat, electricity) when it was really needed?: Not on file   Alcohol Use: Not on file   Transportation Needs: Not on file   Substance Use: Not on file   Health Literacy: Low Risk  (10/18/2021)    Health Literacy     : Never   Physical Activity: Not on file   Interpersonal Safety: Not on file   Stress: Not on file   Intimate Partner Violence: Not on file   Depression: Not on file   Social Connections: Not on file       Would you be willing to receive help with any of the needs that you have identified today? Not applicable       SHIPPING     Specialty Medication(s) to be Shipped:   Inflammatory  Disorders: Dupixent    Other medication(s) to be shipped:  N/A     Changes to insurance: No    Delivery Scheduled: Yes, Expected medication delivery date: 05/31/22.     Medication will be delivered via UPS to the confirmed prescription address in [facility].    The patient will receive a drug information handout for each medication shipped and additional FDA Medication Guides as required.  Verified that patient has previously received a Conservation officer, historic buildings and a Surveyor, mining.    The patient or caregiver noted above participated in the development of this care plan and knows that they can request review of or adjustments to the care plan at any time.      All of the patient's questions and concerns have been addressed.    Robyn Haber, PharmD   [facility] Specialty Pharmacist

## 2022-05-23 ENCOUNTER — Ambulatory Visit (INDEPENDENT_AMBULATORY_CARE_PROVIDER_SITE_OTHER): Payer: Medicaid Other

## 2022-05-23 DIAGNOSIS — L209 Atopic dermatitis, unspecified: Secondary | ICD-10-CM

## 2022-05-24 ENCOUNTER — Other Ambulatory Visit: Payer: Self-pay

## 2022-05-24 ENCOUNTER — Ambulatory Visit (INDEPENDENT_AMBULATORY_CARE_PROVIDER_SITE_OTHER): Payer: Medicaid Other | Admitting: Family Medicine

## 2022-05-24 ENCOUNTER — Encounter: Payer: Self-pay | Admitting: Family Medicine

## 2022-05-24 VITALS — BP 126/78 | HR 68 | Temp 98.1°F | Resp 20 | Ht 66.0 in | Wt 194.4 lb

## 2022-05-24 DIAGNOSIS — L2084 Intrinsic (allergic) eczema: Secondary | ICD-10-CM

## 2022-05-24 DIAGNOSIS — T7800XD Anaphylactic reaction due to unspecified food, subsequent encounter: Secondary | ICD-10-CM

## 2022-05-24 DIAGNOSIS — J301 Allergic rhinitis due to pollen: Secondary | ICD-10-CM

## 2022-05-24 DIAGNOSIS — J4540 Moderate persistent asthma, uncomplicated: Secondary | ICD-10-CM

## 2022-05-24 MED ORDER — EPINEPHRINE 0.3 MG/0.3ML IJ SOAJ: INTRAMUSCULAR | 1 refills | Status: DC

## 2022-05-24 MED ORDER — FLUTICASONE-SALMETEROL 250-50 MCG/ACT IN AEPB: 1.0000 | INHALATION_SPRAY | Freq: Two times a day (BID) | RESPIRATORY_TRACT | 6 refills | Status: DC

## 2022-05-24 MED ORDER — FLUTICASONE PROPIONATE 50 MCG/ACT NA SUSP: NASAL | 5 refills | Status: DC

## 2022-05-24 MED ORDER — ALBUTEROL SULFATE (2.5 MG/3ML) 0.083% IN NEBU: INHALATION_SOLUTION | RESPIRATORY_TRACT | 1 refills | Status: DC

## 2022-05-24 MED ORDER — CETIRIZINE HCL 10 MG PO TABS: ORAL_TABLET | ORAL | 5 refills | Status: DC

## 2022-05-24 MED ORDER — ALBUTEROL SULFATE HFA 108 (90 BASE) MCG/ACT IN AERS: 2.0000 | INHALATION_SPRAY | RESPIRATORY_TRACT | 0 refills | Status: DC | PRN

## 2022-05-24 NOTE — Patient Instructions (Addendum)
Asthma Begin Advair Diskus 250 mg-take 1 puff every 12 hours to prevent coughing or wheezing Continue albuterol 2 puffs every 4 hours if needed for wheezing or coughing spells or instead albuterol 0.083% 1 unit dose every 4 hours if needed. Try not to use this medication on a regular schedule if you are not coughing, wheezing or short of breath Use albuterol 2 puffs 5-15 minutes before exercise to decrease cough or wheeze  Allergic rhinitis Continue Flonase 2 sprays per nostril once a day if needed for stuffy nose Continue saline nasal rinses as needed for nasal symptoms. Use this before any medicated nasal sprays for best result Continue cetirizine 10 mg-take 1 tablet once a day only if needed for runny nose for itching For thick post nasal drainage, begin Mucinex [phone] mg twice a day for and increase hydration as tolerated until your nasal symptoms are resolved  Atopic dermatitis Continue on the treatment of your eczema prescribed by his dermatologist Continue dupilumab 300 mg once every week and have access to an epinephrine auto-injector set  Food allergy Continue to avoid fish, shellfish, peanut and lentils.  If he has an allergic reaction give Benadryl 50 mg every 4 hours and if he has life-threatening symptoms inject with EpiPen 0.3 mg  Continue the other medications as listed in the chart  Call the clinic if this treatment plan is not working well for you  Follow up in 3 months or sooner if needed

## 2022-05-24 NOTE — Progress Notes (Signed)
[address] [place] [address] Dept: [phone]  FOLLOW UP NOTE  Patient ID: Travis Morrison, male    DOB: [birth year]  Age: 21 y.o. MRN: [id number] Date of Office Visit: 05/24/2022  Assessment  Chief Complaint: Follow-up, Medication Refill, and Cough  HPI Terik Haughey is a 21 year old male who presents to the clinic for eval patient of, atopic dermatitis, and food allergy to fish, shellfish, peanuts, and lentils.  He was last seen in this clinic on 12/07/2020 by Thermon Leyland, FNP, for evaluation of allergic rhinitis, atopic dermatitis, and food allergy to fish, shellfish, peanut, and lentils.  He is accompanied by his father who assists with history.  At today's visit, he reports his asthma has been moderately well controlled with cough occurring in the morning and the evening as the main symptom.  He reports this cough is dry and has been present for the last several weeks.  He denies shortness of breath and wheeze with activity or rest.  He reports he last used Advair 250 about a year ago and infrequently uses albuterol with relief of symptoms.  He continues Dupixent once a week for atopic dermatitis and reports that while using Dupixent his asthma symptoms have improved significantly.  Allergic rhinitis is reported as moderately well controlled with symptoms including occasional nasal congestion, sneezing, and postnasal drainage.  He continues cetirizine 10 mg once a day as needed and is not currently using Flonase, nasal saline rinses, or Mucinex.  Atopic dermatitis is reported as moderately well controlled with eczematous areas remaining stable.  He continues to follow his dermatology specialist at [facility] dermatology.  He continues a daily moisturizing routine triamcinolone, and Protopic as needed.  He continues methotrexate weekly and folic acid on the days that he does not take methotrexate.  He continues to avoid fish, shellfish, tree nuts, and lentils with no accidental ingestion or  EpiPen use since his last visit to the clinic.  EpiPen's are currently out of date.  His current medications are listed in the chart.   Drug Allergies:  Allergies  Allergen Reactions   Cyproheptadine Other (See Comments)    Other Reaction: Other reaction Other Reaction: Other reaction    Diphenhydramine Hcl Swelling   Peanuts [Peanut Oil] Anaphylaxis    Pt has an epi pen   Fish Allergy     Unknown   Hydroxyzine Other (See Comments)   Lentil    Shellfish Allergy Cough   Cephalexin Rash   Nickel Other (See Comments) and Rash    Other Reaction: Not Assessed     Physical Exam: BP 126/78 (BP Location: Left Arm, Patient Position: Sitting, Cuff Size: Normal)   Pulse 68   Temp 98.1 F (36.7 C) (Temporal)   Resp 20   Ht 5\' 6"  (1.676 m)   Wt 194 lb 6.4 oz (88.2 kg)   SpO2 100%   BMI 31.38 kg/m    Physical Exam Vitals reviewed.  Constitutional:      Appearance: Normal appearance.  HENT:     Head: Normocephalic and atraumatic.     Right Ear: Tympanic membrane normal.     Left Ear: Tympanic membrane normal.     Nose:     Comments: Bilateral nares slightly erythematous with clear nasal drainage noted.  Pharynx normal.  Ears normal.  Eyes normal.    Mouth/Throat:     Pharynx: Oropharynx is clear.  Eyes:     Conjunctiva/sclera: Conjunctivae normal.  Cardiovascular:     Rate  and Rhythm: Normal rate and regular rhythm.     Heart sounds: Normal heart sounds. No murmur heard. Pulmonary:     Effort: Pulmonary effort is normal.     Breath sounds: Normal breath sounds.     Comments: Lungs clear to auscultation Musculoskeletal:        General: Normal range of motion.     Cervical back: Normal range of motion and neck supple.  Skin:    General: Skin is warm.     Comments: Widespread scattered eczematous patches noted.  No open areas or drainage noted.  Neurological:     Mental Status: He is alert and oriented to person, place, and time.  Psychiatric:        Mood and Affect:  Mood normal.        Behavior: Behavior normal.        Thought Content: Thought content normal.        Judgment: Judgment normal.     Diagnostics: FVC 4.03, FEV1 3.27.  Predicted FVC 4.43, predicted FEV1 3.80.  Spirometry indicates normal ventilatory function.  Assessment and Plan: 1. Not well controlled moderate persistent asthma   2. Intrinsic atopic dermatitis   3. Seasonal allergic rhinitis due to pollen   4. Anaphylactic shock due to food, subsequent encounter     Meds ordered this encounter  Medications   fluticasone-salmeterol (ADVAIR DISKUS) 250-50 MCG/ACT AEPB    Sig: Inhale 1 puff into the lungs in the morning and at bedtime.    Dispense:  60 each    Refill:  6   albuterol (PROAIR HFA) 108 (90 Base) MCG/ACT inhaler    Sig: Inhale 2 puffs into the lungs every 4 (four) hours as needed for wheezing or shortness of breath.    Dispense:  1 each    Refill:  0    Courtesy refill, pt needs an OV   albuterol (PROVENTIL) (2.5 MG/3ML) 0.083% nebulizer solution    Sig: 1 vial in nebulizer every 4 hours as needed for coughing or wheezing.    Dispense:  75 mL    Refill:  1   cetirizine (ZYRTEC) 10 MG tablet    Sig: 1 tablet once a day if needed for runny nose or itchy eyes    Dispense:  34 tablet    Refill:  5   fluticasone (FLONASE) 50 MCG/ACT nasal spray    Sig: 2 spray per nostril once a day if needed for stuffy nose.    Dispense:  16 g    Refill:  5   EPINEPHrine (EPIPEN 2-PAK) 0.3 mg/0.3 mL IJ SOAJ injection    Sig: Use as directed for severe allergic reactions    Dispense:  2 each    Refill:  1    Dispense mylan generic brand only. Not the adrenaclick please.    Patient Instructions  Asthma Begin Advair Diskus 250 mg-take 1 puff every 12 hours to prevent coughing or wheezing Continue albuterol 2 puffs every 4 hours if needed for wheezing or coughing spells or instead albuterol 0.083% 1 unit dose every 4 hours if needed. Try not to use this medication on a regular  schedule if you are not coughing, wheezing or short of breath Use albuterol 2 puffs 5-15 minutes before exercise to decrease cough or wheeze  Allergic rhinitis Continue Flonase 2 sprays per nostril once a day if needed for stuffy nose Continue saline nasal rinses as needed for nasal symptoms. Use this before any medicated nasal sprays for best  result Continue cetirizine 10 mg-take 1 tablet once a day only if needed for runny nose for itching For thick post nasal drainage, begin Mucinex [phone] mg twice a day for and increase hydration as tolerated until your nasal symptoms are resolved  Atopic dermatitis Continue on the treatment of your eczema prescribed by his dermatologist Continue dupilumab 300 mg once every week and have access to an epinephrine auto-injector set  Food allergy Continue to avoid fish, shellfish, peanut and lentils.  If he has an allergic reaction give Benadryl 50 mg every 4 hours and if he has life-threatening symptoms inject with EpiPen 0.3 mg  Continue the other medications as listed in the chart  Call the clinic if this treatment plan is not working well for you  Follow up in 3 months or sooner if needed  Return in about 3 months (around 08/23/2022), or if symptoms worsen or fail to improve.    Thank you for the opportunity to care for this patient.  Please do not hesitate to contact me with questions.  Thermon Leyland, FNP [facility]

## 2022-05-29 ENCOUNTER — Ambulatory Visit: Payer: Medicaid Other

## 2022-05-30 ENCOUNTER — Ambulatory Visit (INDEPENDENT_AMBULATORY_CARE_PROVIDER_SITE_OTHER): Payer: Medicaid Other

## 2022-05-30 DIAGNOSIS — L209 Atopic dermatitis, unspecified: Secondary | ICD-10-CM

## 2022-05-30 MED FILL — DUPIXENT 300 MG/2 ML SUBCUTANEOUS SYRINGE: SUBCUTANEOUS | 28 days supply | Qty: 8 | Fill #3

## 2022-06-05 ENCOUNTER — Ambulatory Visit (INDEPENDENT_AMBULATORY_CARE_PROVIDER_SITE_OTHER): Payer: Medicaid Other

## 2022-06-05 DIAGNOSIS — L209 Atopic dermatitis, unspecified: Secondary | ICD-10-CM

## 2022-06-12 ENCOUNTER — Ambulatory Visit (INDEPENDENT_AMBULATORY_CARE_PROVIDER_SITE_OTHER): Payer: Medicaid Other

## 2022-06-12 DIAGNOSIS — L209 Atopic dermatitis, unspecified: Secondary | ICD-10-CM

## 2022-06-19 NOTE — Unmapped (Signed)
[facility] Refill Coordination Note    Specialty Medication(s) to be Shipped:   Inflammatory Disorders: Dupixent    Other medication(s) to be shipped: No additional medications requested for fill at this time     Barry Bond., DOB: 2001-02-19  Phone: There are no phone numbers on file.      All above HIPAA information was verified with patient's family member, dad.     Was a Nurse, learning disability used for this call? No    Completed refill call assessment today to schedule patient's medication shipment from the [facility] [phone]).  All relevant notes have been reviewed.     Specialty medication(s) and dose(s) confirmed: Regimen is correct and unchanged.   Changes to medications: [name] reports no changes at this time.  Changes to insurance: No  New side effects reported not previously addressed with a pharmacist or physician: None reported  Questions for the pharmacist: No    Confirmed patient received a Conservation officer, historic buildings and a Surveyor, mining with first shipment. The patient will receive a drug information handout for each medication shipped and additional FDA Medication Guides as required.       DISEASE/MEDICATION-SPECIFIC INFORMATION        For patients on injectable medications: Patient currently has 1 doses left.  Next injection is scheduled for 06/20/22.    SPECIALTY MEDICATION ADHERENCE     Medication Adherence    Patient reported X missed doses in the last month: 0  Specialty Medication: dupixent  Patient is on additional specialty medications: No  Informant: father  Reliability of informant: reliable            Support network for adherence: family member                    Were doses missed due to medication being on hold? No        REFERRAL TO PHARMACIST     Referral to the pharmacist: Not needed      [facility]     Shipping address confirmed in Epic.     Delivery Scheduled: Yes, Expected medication delivery date: 06/22/22.     Medication will be delivered via UPS to the prescription address in Epic [facility].    [name]   [facility] Specialty Technician

## 2022-06-21 ENCOUNTER — Ambulatory Visit (INDEPENDENT_AMBULATORY_CARE_PROVIDER_SITE_OTHER): Payer: Medicaid Other

## 2022-06-21 DIAGNOSIS — L209 Atopic dermatitis, unspecified: Secondary | ICD-10-CM

## 2022-06-21 MED FILL — DUPIXENT 300 MG/2 ML SUBCUTANEOUS SYRINGE: SUBCUTANEOUS | 28 days supply | Qty: 8 | Fill #4

## 2022-06-26 ENCOUNTER — Encounter: Payer: Self-pay | Admitting: Family Medicine

## 2022-06-26 ENCOUNTER — Other Ambulatory Visit: Payer: Self-pay

## 2022-06-26 ENCOUNTER — Ambulatory Visit (INDEPENDENT_AMBULATORY_CARE_PROVIDER_SITE_OTHER): Payer: Medicaid Other | Admitting: Family Medicine

## 2022-06-26 VITALS — BP 118/70 | HR 82 | Temp 98.0°F | Resp 18 | Ht 66.0 in | Wt 198.2 lb

## 2022-06-26 DIAGNOSIS — J301 Allergic rhinitis due to pollen: Secondary | ICD-10-CM

## 2022-06-26 DIAGNOSIS — L2084 Intrinsic (allergic) eczema: Secondary | ICD-10-CM

## 2022-06-26 DIAGNOSIS — T7800XD Anaphylactic reaction due to unspecified food, subsequent encounter: Secondary | ICD-10-CM

## 2022-06-26 DIAGNOSIS — L209 Atopic dermatitis, unspecified: Secondary | ICD-10-CM

## 2022-06-26 DIAGNOSIS — J4540 Moderate persistent asthma, uncomplicated: Secondary | ICD-10-CM | POA: Diagnosis not present

## 2022-06-26 NOTE — Patient Instructions (Addendum)
Asthma Continue Advair Diskus 250 mg-take 1 puff every 12 hours to prevent coughing or wheezing Continue albuterol 2 puffs every 4 hours if needed for wheezing or coughing spells or instead albuterol 0.083% 1 unit dose every 4 hours if needed. Try not to use this medication on a regular schedule if you are not coughing, wheezing or short of breath Use albuterol 2 puffs 5-15 minutes before exercise to decrease cough or wheeze  Allergic rhinitis Continue Flonase 2 sprays per nostril once a day if needed for stuffy nose Continue saline nasal rinses as needed for nasal symptoms. Use this before any medicated nasal sprays for best result Continue cetirizine 10 mg-take 1 tablet once a day only if needed for runny nose for itching For thick post nasal drainage, begin Mucinex [phone] mg twice a day for and increase hydration as tolerated until your nasal symptoms are resolved  Atopic dermatitis Continue on the treatment of your eczema prescribed by his dermatologist Continue dupilumab 300 mg once every week   Food allergy Continue to avoid fish, shellfish, peanut and lentils.  If he has an allergic reaction give Benadryl 50 mg every 4 hours and if he has life-threatening symptoms inject with EpiPen 0.3 mg  Continue the other medications as listed in the chart  Call the clinic if this treatment plan is not working well for you  Follow up in 6 months or sooner if needed

## 2022-06-26 NOTE — Progress Notes (Signed)
[place] [address] Dept: [phone]  FOLLOW UP NOTE  Patient ID: Travis Morrison, male    DOB: 2001/06/08  Age: 22 y.o. MRN: [id number] Date of Office Visit: 06/26/2022  Assessment  Chief Complaint: Follow-up (/)  HPI Sidney Kann is a 22 year old male who presents to the clinic for follow-up visit.  He was last seen in this clinic on 05/24/2022 by Gareth Morgan, FNP, for evaluation of asthma, allergic rhinitis, atopic dermatitis, and food allergy to fish, shellfish, lentils, and peanut.  At today's visit, he reports his asthma has been moderately well-controlled with symptoms including dry cough that began about 1 week ago.  He continues Advair diskus 250-1 puff twice a day and has used albuterol a few times this week with relief of symptoms.  Allergic rhinitis is reported as moderately well-controlled with thick postnasal drainage with frequent throat clearing as the main symptom at this time.  He reports this began about 1 week ago.  He continues cetirizine 10 mg once a day he is not currently using Mucinex, Flonase, or saline nasal spray.  Atopic dermatitis is reported as moderately well-controlled with occasional eczematous areas.  He continues a twice a day moisturizing routine as well as Dupixent injections once a week and infrequently uses triamcinolone ointment with relief of symptoms.  He continues to follow-up with his dermatology specialist as recommended.  He continues to avoid peanuts, tree nuts, shellfish, and lentils with no accidental ingestion or EpiPen use since his last visit to this clinic.  His last food allergy testing via lab was on 05/10/2017 and was noted to be positive to peanuts, fish, and shellfish and was borderline to tree nuts and soy.  He is not interested in retesting food allergies at this time.  EpiPen's are up-to-date.  His current medications are listed in the chart.   Drug Allergies:  Allergies  Allergen Reactions   Cyproheptadine Other  (See Comments)    Other Reaction: Other reaction Other Reaction: Other reaction    Diphenhydramine Hcl Swelling   Peanuts [Peanut Oil] Anaphylaxis    Pt has an epi pen   Fish Allergy     Unknown   Hydroxyzine Other (See Comments)   Lentil    Shellfish Allergy Cough   Cephalexin Rash   Nickel Other (See Comments) and Rash    Other Reaction: Not Assessed     Physical Exam: BP 118/70 (BP Location: Left Arm, Patient Position: Sitting, Cuff Size: Normal)   Pulse 82   Temp 98 F (36.7 C) (Temporal)   Resp 18   Ht 5\' 6"  (1.676 m)   Wt 198 lb 3.2 oz (89.9 kg)   SpO2 100%   BMI 31.99 kg/m    Physical Exam Vitals reviewed.  Constitutional:      Appearance: Normal appearance.  HENT:     Head: Normocephalic and atraumatic.     Right Ear: Tympanic membrane normal.     Left Ear: Tympanic membrane normal.     Nose:     Comments: Bilateral nares slightly erythematous with clear nasal drainage noted.  Pharynx normal.  Ears normal.  Eyes normal.    Mouth/Throat:     Pharynx: Oropharynx is clear.  Eyes:     Conjunctiva/sclera: Conjunctivae normal.  Cardiovascular:     Rate and Rhythm: Normal rate and regular rhythm.     Heart sounds: No murmur heard. Pulmonary:     Effort: Pulmonary effort is normal.     Breath sounds:  Normal breath sounds.     Comments: Lungs clear to auscultation Musculoskeletal:        General: Normal range of motion.     Cervical back: Normal range of motion and neck supple.  Skin:    Comments: Scattered small eczematous areas noted.  No open areas or drainage.  Neurological:     Mental Status: He is alert and oriented to person, place, and time.  Psychiatric:        Mood and Affect: Mood normal.        Behavior: Behavior normal.        Thought Content: Thought content normal.        Judgment: Judgment normal.     Diagnostics: FVC 3.94, FEV1 3.18.  Predicted FVC 4.43, predicted FEV1 3.80.  Spirometry indicates normal ventilatory  function.  Assessment and Plan: 1. Moderate persistent asthma without complication   2. Seasonal allergic rhinitis due to pollen   3. Intrinsic atopic dermatitis   4. Anaphylactic shock due to food, subsequent encounter      Patient Instructions  Asthma Continue Advair Diskus 250 mg-take 1 puff every 12 hours to prevent coughing or wheezing Continue albuterol 2 puffs every 4 hours if needed for wheezing or coughing spells or instead albuterol 0.083% 1 unit dose every 4 hours if needed. Try not to use this medication on a regular schedule if you are not coughing, wheezing or short of breath Use albuterol 2 puffs 5-15 minutes before exercise to decrease cough or wheeze  Allergic rhinitis Continue Flonase 2 sprays per nostril once a day if needed for stuffy nose Continue saline nasal rinses as needed for nasal symptoms. Use this before any medicated nasal sprays for best result Continue cetirizine 10 mg-take 1 tablet once a day only if needed for runny nose for itching For thick post nasal drainage, begin Mucinex [phone] mg twice a day for and increase hydration as tolerated until your nasal symptoms are resolved  Atopic dermatitis Continue on the treatment of your eczema prescribed by his dermatologist Continue dupilumab 300 mg once every week   Food allergy Continue to avoid fish, shellfish, peanut and lentils.  If he has an allergic reaction give Benadryl 50 mg every 4 hours and if he has life-threatening symptoms inject with EpiPen 0.3 mg  Continue the other medications as listed in the chart  Call the clinic if this treatment plan is not working well for you  Follow up in 6 months or sooner if needed  Return in about 6 months (around 12/25/2022), or if symptoms worsen or fail to improve.    Thank you for the opportunity to care for this patient.  Please do not hesitate to contact me with questions.  Gareth Morgan, FNP [facility]

## 2022-06-28 ENCOUNTER — Ambulatory Visit: Payer: Medicaid Other

## 2022-07-02 ENCOUNTER — Ambulatory Visit: Payer: Medicaid Other

## 2022-07-03 ENCOUNTER — Ambulatory Visit (INDEPENDENT_AMBULATORY_CARE_PROVIDER_SITE_OTHER): Payer: Medicaid Other

## 2022-07-03 DIAGNOSIS — L209 Atopic dermatitis, unspecified: Secondary | ICD-10-CM | POA: Diagnosis not present

## 2022-07-09 ENCOUNTER — Ambulatory Visit (INDEPENDENT_AMBULATORY_CARE_PROVIDER_SITE_OTHER): Payer: Medicaid Other

## 2022-07-09 DIAGNOSIS — L209 Atopic dermatitis, unspecified: Secondary | ICD-10-CM | POA: Diagnosis not present

## 2022-07-13 NOTE — Unmapped (Signed)
[facility] Refill Coordination Note    Specialty Medication(s) to be Shipped:   Inflammatory Disorders: Dupixent    Other medication(s) to be shipped: No additional medications requested for fill at this time     Barry Bond., DOB: 2000/09/21  Phone: There are no phone numbers on file.      All above HIPAA information was verified with patient's family member, dad.     Was a Nurse, learning disability used for this call? No    Completed refill call assessment today to schedule patient's medication shipment from the [facility] [phone]).  All relevant notes have been reviewed.     Specialty medication(s) and dose(s) confirmed: Regimen is correct and unchanged.   Changes to medications: Adal reports no changes at this time.  Changes to insurance: No  New side effects reported not previously addressed with a pharmacist or physician: None reported  Questions for the pharmacist: No    Confirmed patient received a Conservation officer, historic buildings and a Surveyor, mining with first shipment. The patient will receive a drug information handout for each medication shipped and additional FDA Medication Guides as required.       DISEASE/MEDICATION-SPECIFIC INFORMATION        For patients on injectable medications: Patient currently has 1 doses left.  Next injection is scheduled for 01/22.    SPECIALTY MEDICATION ADHERENCE     Medication Adherence    Patient reported X missed doses in the last month: 0  Specialty Medication: DUPIXENT SYRINGE 300 mg/2 mL  Patient is on additional specialty medications: No  Patient is on more than two specialty medications: No  Any gaps in refill history greater than 2 weeks in the last 3 months: no  Demonstrates understanding of importance of adherence: yes  Informant: father  Reliability of informant: reliable  Provider-estimated medication adherence level: good  Patient is at risk for Non-Adherence: No  Reasons for non-adherence: no problems identified Support network for adherence: family member      Confirmed plan for next specialty medication refill: delivery by pharmacy  Refills needed for supportive medications: not needed          Refill Coordination    Has the Patients' Contact Information Changed: No  Is the Shipping Address Different: No         Were doses missed due to medication being on hold? No    dupixent 300/2 mg/ml: 7 days of medicine on hand       REFERRAL TO PHARMACIST     Referral to the pharmacist: Not needed      [facility]     Shipping address confirmed in Epic.     Delivery Scheduled: Yes, Expected medication delivery date: 01/24.     Medication will be delivered via UPS to the prescription address in Epic WAM.    [name]   [facility] Specialty Technician

## 2022-07-16 ENCOUNTER — Ambulatory Visit: Payer: Medicaid Other

## 2022-07-17 ENCOUNTER — Ambulatory Visit (INDEPENDENT_AMBULATORY_CARE_PROVIDER_SITE_OTHER): Payer: Medicaid Other

## 2022-07-17 DIAGNOSIS — L209 Atopic dermatitis, unspecified: Secondary | ICD-10-CM | POA: Diagnosis not present

## 2022-07-17 MED FILL — DUPIXENT 300 MG/2 ML SUBCUTANEOUS SYRINGE: SUBCUTANEOUS | 28 days supply | Qty: 8 | Fill #5

## 2022-07-24 ENCOUNTER — Ambulatory Visit (INDEPENDENT_AMBULATORY_CARE_PROVIDER_SITE_OTHER): Payer: Medicaid Other

## 2022-07-24 DIAGNOSIS — L209 Atopic dermatitis, unspecified: Secondary | ICD-10-CM | POA: Diagnosis not present

## 2022-07-31 ENCOUNTER — Ambulatory Visit (INDEPENDENT_AMBULATORY_CARE_PROVIDER_SITE_OTHER): Payer: Medicaid Other

## 2022-07-31 DIAGNOSIS — L209 Atopic dermatitis, unspecified: Secondary | ICD-10-CM | POA: Diagnosis not present

## 2022-08-09 ENCOUNTER — Ambulatory Visit (INDEPENDENT_AMBULATORY_CARE_PROVIDER_SITE_OTHER): Payer: Medicaid Other

## 2022-08-09 DIAGNOSIS — L209 Atopic dermatitis, unspecified: Secondary | ICD-10-CM | POA: Diagnosis not present

## 2022-08-15 ENCOUNTER — Ambulatory Visit (INDEPENDENT_AMBULATORY_CARE_PROVIDER_SITE_OTHER): Payer: Medicaid Other

## 2022-08-15 DIAGNOSIS — L209 Atopic dermatitis, unspecified: Secondary | ICD-10-CM | POA: Diagnosis not present

## 2022-08-21 NOTE — Unmapped (Signed)
[facility] Refill Coordination Note    Specialty Medication(s) to be Shipped:   Inflammatory Disorders: Dupixent    Other medication(s) to be shipped: No additional medications requested for fill at this time     Barry Bond., DOB: [birth year]  Phone: There are no phone numbers on file.      All above HIPAA information was verified with patient.     Was a Nurse, learning disability used for this call? No    Completed refill call assessment today to schedule patient's medication shipment from the [facility] [phone]).  All relevant notes have been reviewed.     Specialty medication(s) and dose(s) confirmed: Regimen is correct and unchanged.   Changes to medications: [name] reports no changes at this time.  Changes to insurance: No  New side effects reported not previously addressed with a pharmacist or physician: None reported  Questions for the pharmacist: No    Confirmed patient received a Conservation officer, historic buildings and a Surveyor, mining with first shipment. The patient will receive a drug information handout for each medication shipped and additional FDA Medication Guides as required.       DISEASE/MEDICATION-SPECIFIC INFORMATION        For patients on injectable medications: Patient currently has 1 doses left.  Next injection is scheduled for 3/1.    SPECIALTY MEDICATION ADHERENCE     Medication Adherence    Patient reported X missed doses in the last month: 0  Specialty Medication: Dupixent  Patient is on additional specialty medications: No  Informant: patient  Support network for adherence: family member              Were doses missed due to medication being on hold? No    Dupixent 300/2 mg/ml: 4 days of medicine on hand       REFERRAL TO PHARMACIST     Referral to the pharmacist: Not needed      [facility]     Shipping address confirmed in Epic.     Patient was notified of new phone menu : Yes    Delivery Scheduled: Yes, Expected medication delivery date: 3/6.     Medication will be delivered via UPS to the prescription address in Epic WAM.    [name]   [facility] Specialty Technician

## 2022-08-22 ENCOUNTER — Ambulatory Visit: Admit: 2022-08-22 | Discharge: 2022-08-23 | Payer: BLUE CROSS/BLUE SHIELD

## 2022-08-22 DIAGNOSIS — L209 Atopic dermatitis, unspecified: Principal | ICD-10-CM

## 2022-08-22 DIAGNOSIS — Z79899 Other long term (current) drug therapy: Principal | ICD-10-CM

## 2022-08-22 LAB — CBC W/ AUTO DIFF
BASOPHILS ABSOLUTE COUNT: 0.1 10*9/L (ref 0.0–0.1)
BASOPHILS RELATIVE PERCENT: 0.8 %
EOSINOPHILS ABSOLUTE COUNT: 0.6 10*9/L — ABNORMAL HIGH (ref 0.0–0.5)
EOSINOPHILS RELATIVE PERCENT: 6.2 %
HEMATOCRIT: 45.4 % (ref 39.0–48.0)
HEMOGLOBIN: 15 g/dL (ref 12.9–16.5)
LYMPHOCYTES ABSOLUTE COUNT: 1.9 10*9/L (ref 1.1–3.6)
LYMPHOCYTES RELATIVE PERCENT: 21.3 %
MEAN CORPUSCULAR HEMOGLOBIN CONC: 33.1 g/dL (ref 32.0–36.0)
MEAN CORPUSCULAR HEMOGLOBIN: 28 pg (ref 25.9–32.4)
MEAN CORPUSCULAR VOLUME: 84.4 fL (ref 77.6–95.7)
MEAN PLATELET VOLUME: 9.2 fL (ref 6.8–10.7)
MONOCYTES ABSOLUTE COUNT: 0.6 10*9/L (ref 0.3–0.8)
MONOCYTES RELATIVE PERCENT: 6.3 %
NEUTROPHILS ABSOLUTE COUNT: 5.9 10*9/L (ref 1.8–7.8)
NEUTROPHILS RELATIVE PERCENT: 65.4 %
PLATELET COUNT: 325 10*9/L (ref 150–450)
RED BLOOD CELL COUNT: 5.37 10*12/L (ref 4.26–5.60)
RED CELL DISTRIBUTION WIDTH: 13 % (ref 12.2–15.2)
WBC ADJUSTED: 9 10*9/L (ref 3.6–11.2)

## 2022-08-22 LAB — CREATININE
CREATININE: 0.57 mg/dL — ABNORMAL LOW
EGFR CKD-EPI (2021) MALE: >90 mL/min/{1.73_m2} (ref >=60)

## 2022-08-22 LAB — AST: AST (SGOT): 22 U/L (ref <=34)

## 2022-08-22 LAB — BUN: BLOOD UREA NITROGEN: 9 mg/dL (ref 9–23)

## 2022-08-22 LAB — ALT: ALT (SGPT): 31 U/L (ref 10–49)

## 2022-08-22 MED ORDER — METHOTREXATE SODIUM 2.5 MG TABLET
ORAL_TABLET | 1 refills | 0.00000 days | Status: CP
Start: 2022-08-22 — End: ?

## 2022-08-22 MED ORDER — CLOBETASOL 0.05 % TOPICAL OINTMENT
Freq: Two times a day (BID) | TOPICAL | 1 refills | 0.00000 days | Status: CP
Start: 2022-08-22 — End: ?

## 2022-08-22 MED ORDER — DUPIXENT 300 MG/2 ML SUBCUTANEOUS SYRINGE
SUBCUTANEOUS | 11 refills | 28.00000 days | Status: CP
Start: 2022-08-22 — End: 2023-08-22

## 2022-08-22 MED ORDER — TRIAMCINOLONE ACETONIDE 0.1 % TOPICAL OINTMENT
Freq: Two times a day (BID) | TOPICAL | 1 refills | 0.00000 days | Status: CP
Start: 2022-08-22 — End: 2023-08-22

## 2022-08-22 MED ORDER — FOLIC ACID 1 MG TABLET
ORAL_TABLET | 3 refills | 0.00000 days | Status: CP
Start: 2022-08-22 — End: ?

## 2022-08-22 NOTE — Unmapped (Signed)
Dermatology Note     Assessment and Plan:      Atopic Dermatitis, chronic and improving in severity ~50% BSA  - Has failed numerous previous therapies. Previously treated with pred w/ shown decreased bone density  - Has had an extensive work up for immune deficiency including immunoglobulins, lymphocyte markers, mitogens, vaccine response, genetic testing, and skin biopsy all pointing to normal eczema.  - Rinvoq on reserve if not remaining well controlled  Plan:  - Continue methotrexate 15 mg (6 tablets) every 7 days. Refilled today.  - Continue 1 mg folic acid daily on non methotrexate days. Refilled today.  - Continue dupilumab (DUPIXENT) 300 mg/2 mL Syrg injection every 7 days. Refilled today.  - Continue triamcinolone (KENALOG) 0.1 % ointment BID. Refilled today.  - Continue clobetasoL (TEMOVATE) 0.05 % ointment BID to thicker areas of rash, avoid face and skin folds. Refilled today.  - Recommended to continue use of heavy emollients, prefer cerave and vaseline.     High risk medication use (methotrexate)  - Quantiferon gold negative 06/20/21  - Labs drawn 08/22/2021 WNL: CBC with diff, AST ALT BUN Cr.  Recheck today. Patient would like results reviewed by phone.  - Labs ordered today: CBC w/ diff, BUN, Creatinine, AST, ALT    The patient was advised to call for an appointment should any new, changing, or symptomatic lesions develop.     RTC: Return in about 6 months (around 02/20/2023) for follow up of eczema. or sooner as needed   _________________________________________________________________      Chief Complaint     Chief Complaint   Patient presents with    Follow-up     Eczema follow up, getting better per pt       HPI     Barry Bond. is a 22 y.o. male who presents as a returning patient (last seen by Lottie Dawson, PA on 02/20/2022) to Dermatology for follow up of atopic dermatitis. At last visit, patient was to increase methotrexate to 15 mg once weekly with folic acid 1 mg on non-methotrexate days, continue dupilumab 300 mg injections every 7 days, continue triamcinolone 0.1% ointment, continue clobetasol 0.05%  ointment, and continue protopic 0.1% ointment for atopic dermatitis.    Eczema  - Reports that his skin has been doing well since increasing methotrexate.  - States he has not taken methotrexate for a couple of weeks because he could not refill his prescription   - Notes he has not had any severe flares since coming off of methotrexate, but would like to restart.  - Describes that he is occasionally pruritic  - Reports he has some rash on his    The patient denies any other new or changing lesions or areas of concern.     Pertinent Past Medical History     No history of skin cancer    Family History:   Negative for melanoma    Past Medical History, Family History, Social History, Medication List, Allergies, and Problem List were reviewed in the rooming section of Epic.     ROS: Other than symptoms mentioned in the HPI, no fevers, chills, or other skin complaints    Physical Examination     GENERAL: Well-appearing male in no acute distress, resting comfortably.  NEURO: Alert and oriented, answers questions appropriately  PSYCH: Normal mood and affect  SKIN: Examination of the face, neck, bilateral upper extremities, bilateral lower extremities, and hands was performed  - Extensive dry pink patches with lichenifications of the  popliteal fossa, improved from last visit    All areas not commented on are within normal limits or unremarkable    Scribe's Attestation: Revonda Standard C. Judeth Porch, PA-C obtained and performed the history, physical exam and medical decision making elements that were entered into the chart.  Signed by Cherlynn Kaiser, Scribe, on August 22, 2022 at 3:58 PM.    ----------------------------------------------------------------------------------------------------------------------  August 22, 2022 4:11 PM. Documentation assistance provided by the Scribe. I was present during the time the encounter was recorded. The information recorded by the Scribe was done at my direction and has been reviewed and validated by me.  ----------------------------------------------------------------------------------------------------------------------      (Approved Template 03/07/2020)

## 2022-08-23 NOTE — Unmapped (Addendum)
For your eczema:  - Restart taking methotrexate 15 mg (6 tablets) once weekly with folic acid 1 mg on non-methotrexate days  - Continue dupilumab injections every 7 days  - Apply triamcinolone ointment twice daily as needed for flares  - Apply clobetasol ointment twice daily as needed to stubborn flares

## 2022-08-24 ENCOUNTER — Ambulatory Visit (INDEPENDENT_AMBULATORY_CARE_PROVIDER_SITE_OTHER): Payer: Medicaid Other

## 2022-08-24 DIAGNOSIS — L209 Atopic dermatitis, unspecified: Secondary | ICD-10-CM

## 2022-08-31 ENCOUNTER — Ambulatory Visit: Payer: Medicaid Other

## 2022-09-03 ENCOUNTER — Ambulatory Visit: Payer: Medicaid Other

## 2022-09-04 ENCOUNTER — Ambulatory Visit (INDEPENDENT_AMBULATORY_CARE_PROVIDER_SITE_OTHER): Payer: Medicaid Other

## 2022-09-04 DIAGNOSIS — L209 Atopic dermatitis, unspecified: Secondary | ICD-10-CM

## 2022-09-11 ENCOUNTER — Ambulatory Visit (INDEPENDENT_AMBULATORY_CARE_PROVIDER_SITE_OTHER): Payer: Medicaid Other

## 2022-09-11 DIAGNOSIS — L209 Atopic dermatitis, unspecified: Secondary | ICD-10-CM

## 2022-09-18 ENCOUNTER — Ambulatory Visit (INDEPENDENT_AMBULATORY_CARE_PROVIDER_SITE_OTHER): Payer: Medicaid Other

## 2022-09-18 DIAGNOSIS — L209 Atopic dermatitis, unspecified: Secondary | ICD-10-CM | POA: Diagnosis not present

## 2022-09-25 ENCOUNTER — Ambulatory Visit: Payer: Medicaid Other

## 2022-09-28 ENCOUNTER — Ambulatory Visit (INDEPENDENT_AMBULATORY_CARE_PROVIDER_SITE_OTHER): Payer: Medicaid Other

## 2022-09-28 DIAGNOSIS — L209 Atopic dermatitis, unspecified: Secondary | ICD-10-CM

## 2022-10-04 ENCOUNTER — Ambulatory Visit (INDEPENDENT_AMBULATORY_CARE_PROVIDER_SITE_OTHER): Payer: Medicaid Other

## 2022-10-04 DIAGNOSIS — L209 Atopic dermatitis, unspecified: Secondary | ICD-10-CM | POA: Diagnosis not present

## 2022-10-09 ENCOUNTER — Ambulatory Visit (INDEPENDENT_AMBULATORY_CARE_PROVIDER_SITE_OTHER): Payer: Medicaid Other | Admitting: *Deleted

## 2022-10-09 DIAGNOSIS — L209 Atopic dermatitis, unspecified: Secondary | ICD-10-CM

## 2022-10-16 ENCOUNTER — Ambulatory Visit (INDEPENDENT_AMBULATORY_CARE_PROVIDER_SITE_OTHER): Payer: Medicaid Other

## 2022-10-16 ENCOUNTER — Ambulatory Visit: Payer: Self-pay

## 2022-10-16 DIAGNOSIS — L209 Atopic dermatitis, unspecified: Secondary | ICD-10-CM | POA: Diagnosis not present

## 2022-10-23 DIAGNOSIS — L209 Atopic dermatitis, unspecified: Principal | ICD-10-CM

## 2022-10-23 MED ORDER — DUPIXENT 300 MG/2 ML SUBCUTANEOUS SYRINGE
SUBCUTANEOUS | 11 refills | 28.00000 days | Status: CP
Start: 2022-10-23 — End: 2023-10-23
  Filled 2022-10-26: qty 8, 28d supply, fill #0

## 2022-10-23 NOTE — Unmapped (Signed)
Prescription refill request for dupixent 300mg /63ml , Last office visit was 08/22/22    Please Advise

## 2022-10-23 NOTE — Unmapped (Signed)
[facility] Refill Coordination Note    Specialty Medication(s) to be Shipped:   Inflammatory Disorders: Dupixent    Other medication(s) to be shipped: No additional medications requested for fill at this time     Barry Bond., DOB: [birth year]  Phone: There are no phone numbers on file.      All above HIPAA information was verified with patient.     Was a Nurse, learning disability used for this call? No    Completed refill call assessment today to schedule patient's medication shipment from the [facility] [phone]).  All relevant notes have been reviewed.     Specialty medication(s) and dose(s) confirmed: Regimen is correct and unchanged.   Changes to medications: Barry Bond reports no changes at this time.  Changes to insurance: No  New side effects reported not previously addressed with a pharmacist or physician: None reported  Questions for the pharmacist: No    Confirmed patient received a Conservation officer, historic buildings and a Surveyor, mining with first shipment. The patient will receive a drug information handout for each medication shipped and additional FDA Medication Guides as required.       DISEASE/MEDICATION-SPECIFIC INFORMATION        For patients on injectable medications: Patient currently has 0 doses left.  Next injection is scheduled for 05/03-asap.    SPECIALTY MEDICATION ADHERENCE     Medication Adherence    Patient reported X missed doses in the last month: 2  Specialty Medication: dupixent 300mg /89ml  Patient is on additional specialty medications: No  Patient is on more than two specialty medications: No  Any gaps in refill history greater than 2 weeks in the last 3 months: no  Demonstrates understanding of importance of adherence: yes  Informant: patient  Reliability of informant: reliable  Provider-estimated medication adherence level: good  Patient is at risk for Non-Adherence: No   Other non-adherence reason: pt has no med   Support network for adherence: family member  Confirmed plan for next specialty medication refill: delivery by pharmacy  Refills needed for supportive medications: yes, ordered or provider notified          Refill Coordination    Has the Patients' Contact Information Changed: No  Is the Shipping Address Different: No         Were doses missed due to medication being on hold? No    Dupixent  300/2 mg/ml: 0 days of medicine on hand         REFERRAL TO PHARMACIST     Referral to the pharmacist: Not needed      [facility]     Shipping address confirmed in Epic.       Delivery Scheduled: Yes, Expected medication delivery date: 05/03.  However, Rx request for refills was sent to the provider as there are none remaining.     Medication will be delivered via UPS to the prescription address in Epic WAM.    [name]   [facility] Specialty Technician

## 2022-10-24 ENCOUNTER — Ambulatory Visit (INDEPENDENT_AMBULATORY_CARE_PROVIDER_SITE_OTHER): Payer: Medicaid Other

## 2022-10-24 ENCOUNTER — Ambulatory Visit: Payer: Medicaid Other

## 2022-10-24 DIAGNOSIS — L209 Atopic dermatitis, unspecified: Secondary | ICD-10-CM | POA: Diagnosis not present

## 2022-11-01 ENCOUNTER — Ambulatory Visit (INDEPENDENT_AMBULATORY_CARE_PROVIDER_SITE_OTHER): Payer: Medicaid Other

## 2022-11-01 DIAGNOSIS — L209 Atopic dermatitis, unspecified: Secondary | ICD-10-CM | POA: Diagnosis not present

## 2022-11-08 ENCOUNTER — Ambulatory Visit (INDEPENDENT_AMBULATORY_CARE_PROVIDER_SITE_OTHER): Payer: Medicaid Other

## 2022-11-08 DIAGNOSIS — L209 Atopic dermatitis, unspecified: Secondary | ICD-10-CM | POA: Diagnosis not present

## 2022-11-16 ENCOUNTER — Ambulatory Visit: Payer: Medicaid Other

## 2022-11-23 DIAGNOSIS — Z79899 Other long term (current) drug therapy: Principal | ICD-10-CM

## 2022-11-23 DIAGNOSIS — L209 Atopic dermatitis, unspecified: Principal | ICD-10-CM

## 2022-11-23 MED ORDER — METHOTREXATE SODIUM 2.5 MG TABLET
ORAL_TABLET | 1 refills | 0 days
Start: 2022-11-23 — End: ?

## 2022-11-23 MED ORDER — FOLIC ACID 1 MG TABLET
ORAL_TABLET | 3 refills | 0 days
Start: 2022-11-23 — End: ?

## 2022-11-23 NOTE — Unmapped (Signed)
Simcha's father reports he continues to do well on Dupixent/mtx. He's nad no new flaring but still has some persistent disease (improved more recently with mtx addition). He continues to use topical medication pretty regularly.     He asked if we could fill mtx/folic acid for them as it's easier than getting through [facility]. I requested refills.     [facility] Clinical Assessment & Refill Coordination Note    Cadin Oetting., DOB: Nov 08, 2000  Phone: There are no phone numbers on file.    All above HIPAA information was verified with patient's family member, father, Abimelec.     Was a Nurse, learning disability used for this call? No    Specialty Medication(s):   Inflammatory Disorders: Dupixent     Current Outpatient Medications   Medication Sig Dispense Refill    calcium carbonate-vitamin D3 600 mg(1,500mg ) -800 unit Tab TAKE 1 TABLET BY MOUTH EVERY DAY 90 tablet 3    cetirizine (ZYRTEC) 10 MG tablet 1 tablet once a day if needed for runny nose or itchy eyes      clobetasol (TEMOVATE) 0.05 % ointment Apply topically Two (2) times a day. 120 g 1    dupilumab (DUPIXENT SYRINGE) 300 mg/2 mL Syrg injection Inject the contents of 1 syringe (300 mg total) under the skin every seven (7) days. 8 mL 11    EPINEPHrine (EPIPEN) 0.3 mg/0.3 mL injection Inject 0.3 mL (0.3 mg total) into the muscle once. for 1 dose In case of anaphylaxis 2 each 3    folic acid (FOLVITE) 1 MG tablet Please take 1 mg on days that you are not taking Methotrexate 90 tablet 3    methotrexate 2.5 MG tablet Take 6 pills once weekly 72 tablet 1    triamcinolone (KENALOG) 0.1 % ointment Apply topically two (2) times a day to flares as needed 454 g 1     No current facility-administered medications for this visit.        Changes to medications: Kratos reports no changes at this time.    Allergies   Allergen Reactions    Benadryl [Diphenhydramine Hcl] Swelling    Peanut Swelling and Other (See Comments)     Face    Peanut Oil Anaphylaxis     Pt has an epi pen  Pt has an epi pen    Soy Anaphylaxis    Cetirizine Other (See Comments)     Other reaction(s): Unknown    Cyproheptadine Other (See Comments)     Other Reaction: Other reaction    Fish Containing Products Other (See Comments)     Unknown  Other reaction(s): UNKNOWN    Hydroxyzine Other (See Comments)    Lentils     Shellfish Containing Products Cough     Unknown    Nickel Rash       Changes to allergies: No    SPECIALTY MEDICATION ADHERENCE     Dupixent - 0 left    Medication Adherence    Patient reported X missed doses in the last month: 0  Specialty Medication: Dupixent  Support network for adherence: family member          Specialty medication(s) dose(s) confirmed: Regimen is correct and unchanged.     Are there any concerns with adherence? No    Adherence counseling provided? Not needed    CLINICAL MANAGEMENT AND INTERVENTION      Clinical Benefit Assessment:    Do you feel the medicine is effective or helping  your condition? Yes    Clinical Benefit counseling provided? Not needed    Adverse Effects Assessment:    Are you experiencing any side effects? No    Are you experiencing difficulty administering your medicine? No    Quality of Life Assessment:    Quality of Life    Rheumatology  Oncology  Dermatology  1. What impact has your specialty medication had on the symptoms of your skin condition (i.e. itchiness, soreness, stinging)?: Some  2. What impact has your specialty medication had on your comfort level with your skin?: Some  Cystic Fibrosis          How many days over the past month did your AD  keep you from your normal activities? For example, brushing your teeth or getting up in the morning. Patient declined to answer    Have you discussed this with your provider? Not needed    Acute Infection Status:    Acute infections noted within Epic:  No active infections  Patient reported infection: None    Therapy Appropriateness:    Is therapy appropriate and patient progressing towards therapeutic goals? Yes, therapy is appropriate and should be continued    DISEASE/MEDICATION-SPECIFIC INFORMATION      For patients on injectable medications: Patient currently has 0 doses left.  Next injection is scheduled for ASAP (is due today, 5/31 but won't be able to deliver until 6/4).    Chronic Inflammatory Diseases: Have you experienced any flares in the last month? No  Has this been reported to your provider? No    PATIENT SPECIFIC NEEDS     Does the patient have any physical, cognitive, or cultural barriers? No    Is the patient high risk? No    Did the patient require a clinical intervention? No    Does the patient require physician intervention or other additional services (i.e., nutrition, smoking cessation, social work)? No    SOCIAL DETERMINANTS OF HEALTH     At the [facility], we have learned that life circumstances - like trouble affording food, housing, utilities, or transportation can affect the health of many of our patients.   That is why we wanted to ask: are you currently experiencing any life circumstances that are negatively impacting your health and/or quality of life? Patient declined to answer    Social Determinants of Health     Financial Resource Strain: Not on file   Internet Connectivity: Not on file   Food Insecurity: Not on file   Tobacco Use: Low Risk  (08/22/2022)    Patient History     Smoking Tobacco Use: Never     Smokeless Tobacco Use: Never     Passive Exposure: Not on file   Housing/Utilities: Unknown (10/18/2021)    Housing/Utilities     Within the past 12 months, have you ever stayed: outside, in a car, in a tent, in an overnight shelter, or temporarily in someone else's home (i.e. couch-surfing)?: No     Are you worried about losing your housing?: Not on file     Within the past 12 months, have you been unable to get utilities (heat, electricity) when it was really needed?: Not on file   Alcohol Use: Not on file   Transportation Needs: Not on file   Substance Use: Not on file   Health Literacy: Low Risk  (10/18/2021)    Health Literacy     : Never   Physical Activity: Not on file   Interpersonal Safety:  Not on file   Stress: Not on file   Intimate Partner Violence: Not on file   Depression: Not on file   Social Connections: Not on file       Would you be willing to receive help with any of the needs that you have identified today? Not applicable       SHIPPING     Specialty Medication(s) to be Shipped:   Inflammatory Disorders: Dupixent    Other medication(s) to be shipped:  clobetasol ointment, mtx (requested), folic acid (requested)     Changes to insurance: No    Delivery Scheduled: Yes, Expected medication delivery date: 6/4.     Medication will be delivered via UPS to the confirmed prescription address in [facility].    The patient will receive a drug information handout for each medication shipped and additional FDA Medication Guides as required.  Verified that patient has previously received a Conservation officer, historic buildings and a Surveyor, mining.    The patient or caregiver noted above participated in the development of this care plan and knows that they can request review of or adjustments to the care plan at any time.      All of the patient's questions and concerns have been addressed.    [name]   [facility] Specialty Pharmacist

## 2022-11-26 MED ORDER — METHOTREXATE SODIUM 2.5 MG TABLET
ORAL_TABLET | 0 refills | 0.00000 days | Status: CP
Start: 2022-11-26 — End: ?
  Filled 2022-11-28: qty 24, 28d supply, fill #0

## 2022-11-26 MED ORDER — FOLIC ACID 1 MG TABLET
ORAL_TABLET | 3 refills | 0.00000 days | Status: CP
Start: 2022-11-26 — End: ?
  Filled 2022-11-28: qty 90, 90d supply, fill #0

## 2022-11-26 MED FILL — DUPIXENT 300 MG/2 ML SUBCUTANEOUS SYRINGE: SUBCUTANEOUS | 28 days supply | Qty: 8 | Fill #1

## 2022-11-26 MED FILL — CLOBETASOL 0.05 % TOPICAL OINTMENT: TOPICAL | 30 days supply | Qty: 120 | Fill #0

## 2022-11-27 ENCOUNTER — Ambulatory Visit: Payer: Medicaid Other

## 2022-11-30 ENCOUNTER — Ambulatory Visit (INDEPENDENT_AMBULATORY_CARE_PROVIDER_SITE_OTHER): Payer: Medicaid Other

## 2022-11-30 DIAGNOSIS — L209 Atopic dermatitis, unspecified: Secondary | ICD-10-CM | POA: Diagnosis not present

## 2022-12-05 ENCOUNTER — Ambulatory Visit: Payer: Medicaid Other

## 2022-12-07 ENCOUNTER — Ambulatory Visit (INDEPENDENT_AMBULATORY_CARE_PROVIDER_SITE_OTHER): Payer: Medicaid Other

## 2022-12-07 DIAGNOSIS — J309 Allergic rhinitis, unspecified: Secondary | ICD-10-CM

## 2022-12-13 ENCOUNTER — Ambulatory Visit (INDEPENDENT_AMBULATORY_CARE_PROVIDER_SITE_OTHER): Payer: Medicaid Other

## 2022-12-13 DIAGNOSIS — L209 Atopic dermatitis, unspecified: Secondary | ICD-10-CM | POA: Diagnosis not present

## 2022-12-18 NOTE — Unmapped (Addendum)
[facility] Refill Coordination Note    Specialty Medication(s) to be Shipped:   Inflammatory Disorders: Dupixent    Other medication(s) to be shipped:  Methotrexate 2.5 mg tab     Barry Bond., DOB: 06/01/2001  Phone: There are no phone numbers on file.      All above HIPAA information was verified with patient.     Was a Nurse, learning disability used for this call? No    Completed refill call assessment today to schedule patient's medication shipment from the [facility] [phone]).  All relevant notes have been reviewed.     Specialty medication(s) and dose(s) confirmed: Regimen is correct and unchanged.   Changes to medications: Barry Bond reports no changes at this time.  Changes to insurance: No  New side effects reported not previously addressed with a pharmacist or physician: None reported  Questions for the pharmacist: No    Confirmed patient received a Conservation officer, historic buildings and a Surveyor, mining with first shipment. The patient will receive a drug information handout for each medication shipped and additional FDA Medication Guides as required.       DISEASE/MEDICATION-SPECIFIC INFORMATION        For patients on injectable medications: Patient currently has 1 doses left.  Next injection is scheduled for 06/27.    SPECIALTY MEDICATION ADHERENCE     Medication Adherence    Patient reported X missed doses in the last month: 0  Specialty Medication: dupilumab (DUPIXENT SYRINGE) 300 mg/2 mL Syrg injection  Patient is on additional specialty medications: No  Informant: patient  Support network for adherence: family member                Were doses missed due to medication being on hold? No    Dupixent  300/2 mg/ml: 2 days of medicine on hand         REFERRAL TO PHARMACIST     Referral to the pharmacist: Not needed      [facility]     Shipping address confirmed in Epic.       Delivery Scheduled: Yes, Expected medication delivery date: 7/2.     Medication will be delivered via UPS to the prescription address in Epic WAM.    Barry Bond   [facility] Specialty Technician

## 2022-12-20 ENCOUNTER — Ambulatory Visit (INDEPENDENT_AMBULATORY_CARE_PROVIDER_SITE_OTHER): Payer: Medicaid Other

## 2022-12-20 DIAGNOSIS — L209 Atopic dermatitis, unspecified: Secondary | ICD-10-CM

## 2022-12-24 ENCOUNTER — Ambulatory Visit (INDEPENDENT_AMBULATORY_CARE_PROVIDER_SITE_OTHER): Payer: Medicaid Other

## 2022-12-24 DIAGNOSIS — L209 Atopic dermatitis, unspecified: Secondary | ICD-10-CM | POA: Diagnosis not present

## 2022-12-24 MED FILL — DUPIXENT 300 MG/2 ML SUBCUTANEOUS SYRINGE: SUBCUTANEOUS | 28 days supply | Qty: 8 | Fill #2

## 2022-12-24 MED FILL — METHOTREXATE SODIUM 2.5 MG TABLET: 28 days supply | Qty: 24 | Fill #1

## 2022-12-25 ENCOUNTER — Ambulatory Visit: Payer: Medicaid Other | Admitting: Family Medicine

## 2022-12-31 ENCOUNTER — Ambulatory Visit: Payer: Medicaid Other

## 2023-01-01 ENCOUNTER — Ambulatory Visit: Payer: Medicaid Other | Admitting: Family Medicine

## 2023-01-01 ENCOUNTER — Other Ambulatory Visit: Payer: Self-pay

## 2023-01-01 ENCOUNTER — Encounter: Payer: Self-pay | Admitting: Family Medicine

## 2023-01-01 VITALS — BP 118/82 | HR 70 | Temp 98.7°F | Resp 16 | Ht 66.0 in | Wt 193.8 lb

## 2023-01-01 DIAGNOSIS — T7800XD Anaphylactic reaction due to unspecified food, subsequent encounter: Secondary | ICD-10-CM | POA: Diagnosis not present

## 2023-01-01 DIAGNOSIS — L209 Atopic dermatitis, unspecified: Secondary | ICD-10-CM | POA: Diagnosis not present

## 2023-01-01 DIAGNOSIS — J4540 Moderate persistent asthma, uncomplicated: Secondary | ICD-10-CM | POA: Diagnosis not present

## 2023-01-01 DIAGNOSIS — J301 Allergic rhinitis due to pollen: Secondary | ICD-10-CM

## 2023-01-01 MED ORDER — EUCRISA 2 % EX OINT: 1.0000 | TOPICAL_OINTMENT | Freq: Two times a day (BID) | CUTANEOUS | 1 refills | Status: DC | PRN

## 2023-01-01 NOTE — Progress Notes (Signed)
   [address] [place] [address] Dept: [phone]  FOLLOW UP NOTE  Patient ID: Travis Morrison, male    DOB: [birth year]  Age: 22 y.o. MRN: [id number] Date of Office Visit: 01/01/2023  Assessment  Chief Complaint: No chief complaint on file.  HPI Travis Morrison is a 22 year old male who presents to the clinic for follow-up visit.  He was last seen in this clinic on 06/26/2022 by Thermon Leyland, FNP, for evaluation of asthma, allergic rhinitis, atopic dermatitis on Dupixent injections, and food allergy to fish, shellfish, lentils, and peanut.   Drug Allergies:  Allergies  Allergen Reactions   Cyproheptadine Other (See Comments)    Other Reaction: Other reaction Other Reaction: Other reaction    Diphenhydramine Hcl Swelling   Peanuts [Peanut Oil] Anaphylaxis    Pt has an epi pen   Fish Allergy     Unknown   Hydroxyzine Other (See Comments)   Lentil    Shellfish Allergy Cough   Cephalexin Rash   Nickel Other (See Comments) and Rash    Other Reaction: Not Assessed     Physical Exam: There were no vitals taken for this visit.   Physical Exam  Diagnostics: FVC 3.98, FEV1 3.16.  Predicted FVC 4.43, predicted FEV1 3.79.  Spirometry indicates normal ventilatory function.  Assessment and Plan: No diagnosis found.  No orders of the defined types were placed in this encounter.   There are no Patient Instructions on file for this visit.  No follow-ups on file.    Thank you for the opportunity to care for this patient.  Please do not hesitate to contact me with questions.  Thermon Leyland, FNP [facility]

## 2023-01-01 NOTE — Patient Instructions (Addendum)
Asthma Continue Advair Diskus 250 mg-take 1 puff every 12 hours to prevent coughing or wheezing Continue albuterol 2 puffs every 4 hours if needed for wheezing or coughing spells or instead albuterol 0.083% 1 unit dose every 4 hours if needed. Try not to use this medication on a regular schedule if you are not coughing, wheezing or short of breath Use albuterol 2 puffs 5-15 minutes before exercise to decrease cough or wheeze  Allergic rhinitis Continue Flonase 2 sprays per nostril once a day if needed for stuffy nose Continue saline nasal rinses as needed for nasal symptoms. Use this before any medicated nasal sprays for best result Continue cetirizine 10 mg-take 1 tablet once a day only if needed for runny nose for itching For thick post nasal drainage, begin Mucinex [phone] mg twice a day for and increase hydration as tolerated until your nasal symptoms are resolved Consider updating your environmental allergy testing.  Remember to stop antihistamines for 3 days before the testing appointment  Atopic dermatitis Continue on the treatment of your eczema prescribed by his dermatologist Try Eucrisa to red and itchy areas twice a day as needed Continue dupilumab 300 mg once every week   Food allergy Continue to avoid fish, shellfish, peanut and lentils.  If he has an allergic reaction give Benadryl 50 mg every 4 hours and if he has life-threatening symptoms inject with EpiPen 0.3 mg Consider updating your food allergy testing.  Remember to stop antihistamines for 3 days before your testing appointment  Continue the other medications as listed in the chart  Call the clinic if this treatment plan is not working well for you  Follow up in 6 months or sooner if needed

## 2023-01-02 ENCOUNTER — Encounter: Payer: Self-pay | Admitting: Family Medicine

## 2023-01-02 ENCOUNTER — Telehealth: Payer: Self-pay

## 2023-01-02 MED ORDER — FLUTICASONE-SALMETEROL 250-50 MCG/ACT IN AEPB: 1.0000 | INHALATION_SPRAY | Freq: Two times a day (BID) | RESPIRATORY_TRACT | 6 refills | Status: DC

## 2023-01-02 NOTE — Telephone Encounter (Signed)
*  Asthma/Allergy  PA request received for Eucrisa 2% ointment  PA submitted to [facility] Medicaid via [facility] and has been APPROVED from 01/02/2023-01/01/2024  Key: [facility]

## 2023-01-02 NOTE — Telephone Encounter (Signed)
Pa approved until 01/02/2024

## 2023-01-04 ENCOUNTER — Ambulatory Visit: Payer: Medicaid Other

## 2023-01-08 ENCOUNTER — Ambulatory Visit: Payer: Medicaid Other

## 2023-01-10 ENCOUNTER — Ambulatory Visit (INDEPENDENT_AMBULATORY_CARE_PROVIDER_SITE_OTHER): Payer: Medicaid Other

## 2023-01-10 DIAGNOSIS — J4540 Moderate persistent asthma, uncomplicated: Secondary | ICD-10-CM | POA: Diagnosis not present

## 2023-01-17 ENCOUNTER — Ambulatory Visit (INDEPENDENT_AMBULATORY_CARE_PROVIDER_SITE_OTHER): Payer: Medicaid Other

## 2023-01-17 DIAGNOSIS — J4540 Moderate persistent asthma, uncomplicated: Secondary | ICD-10-CM

## 2023-01-22 NOTE — Unmapped (Signed)
.  Barry Bond. has been contacted in regards to their refill of Dupixent. At this time, they have declined refill due to  Father stateing that patient is having a bad reaction of dry/pealing skin on hands and they have an emergency Dermatology appointment with Provider 7/31.  Patient's father  says medication might be changed and does not wish to schedule delivery for a refill at this time. Refill assessment call date has been updated per the patient's request to 8/2. Barry Bond

## 2023-01-23 ENCOUNTER — Ambulatory Visit
Admit: 2023-01-23 | Discharge: 2023-01-24 | Payer: BLUE CROSS/BLUE SHIELD | Attending: Internal Medicine | Primary: Internal Medicine

## 2023-01-23 DIAGNOSIS — L209 Atopic dermatitis, unspecified: Principal | ICD-10-CM

## 2023-01-23 DIAGNOSIS — Z79899 Other long term (current) drug therapy: Principal | ICD-10-CM

## 2023-01-23 LAB — CBC W/ AUTO DIFF
BASOPHILS ABSOLUTE COUNT: 0.1 10*9/L (ref 0.0–0.1)
BASOPHILS RELATIVE PERCENT: 0.9 %
EOSINOPHILS ABSOLUTE COUNT: 1.1 10*9/L — ABNORMAL HIGH (ref 0.0–0.5)
EOSINOPHILS RELATIVE PERCENT: 12.7 %
HEMATOCRIT: 46.3 % (ref 39.0–48.0)
HEMOGLOBIN: 15.4 g/dL (ref 12.9–16.5)
LYMPHOCYTES ABSOLUTE COUNT: 1.7 10*9/L (ref 1.1–3.6)
LYMPHOCYTES RELATIVE PERCENT: 20.1 %
MEAN CORPUSCULAR HEMOGLOBIN CONC: 33.2 g/dL (ref 32.0–36.0)
MEAN CORPUSCULAR HEMOGLOBIN: 28.9 pg (ref 25.9–32.4)
MEAN CORPUSCULAR VOLUME: 86.9 fL (ref 77.6–95.7)
MEAN PLATELET VOLUME: 9.5 fL (ref 6.8–10.7)
MONOCYTES ABSOLUTE COUNT: 0.4 10*9/L (ref 0.3–0.8)
MONOCYTES RELATIVE PERCENT: 4.9 %
NEUTROPHILS ABSOLUTE COUNT: 5.1 10*9/L (ref 1.8–7.8)
NEUTROPHILS RELATIVE PERCENT: 61.4 %
PLATELET COUNT: 266 10*9/L (ref 150–450)
RED BLOOD CELL COUNT: 5.33 10*12/L (ref 4.26–5.60)
RED CELL DISTRIBUTION WIDTH: 13.3 % (ref 12.2–15.2)
WBC ADJUSTED: 8.3 10*9/L (ref 3.6–11.2)

## 2023-01-23 LAB — AST: AST (SGOT): 45 U/L — ABNORMAL HIGH (ref <=34)

## 2023-01-23 LAB — BUN: BLOOD UREA NITROGEN: 10 mg/dL (ref 9–23)

## 2023-01-23 LAB — ALT: ALT (SGPT): 62 U/L — ABNORMAL HIGH (ref 10–49)

## 2023-01-23 LAB — CREATININE
CREATININE: 0.56 mg/dL — ABNORMAL LOW
EGFR CKD-EPI (2021) MALE: >90 mL/min/{1.73_m2} (ref >=60)

## 2023-01-23 MED FILL — DUPIXENT 300 MG/2 ML SUBCUTANEOUS SYRINGE: SUBCUTANEOUS | 28 days supply | Qty: 8 | Fill #3

## 2023-01-23 NOTE — Unmapped (Signed)
Dermatology Note    Assessment and Plan:      Atopic Dermatitis, chronic and improving in severity ~50% BSA (hands are flaring)  - Has failed numerous previous therapies. Previously treated with pred w/ shown decreased bone density  - Has had an extensive work up for immune deficiency including immunoglobulins, lymphocyte markers, mitogens, vaccine response, genetic testing, and skin biopsy all pointing to normal eczema.  - Rinvoq on reserve if not remaining well controlled  Plan:  - Continue methotrexate 15 mg (6 tablets) every 7 days. methotrexate cumulative dose 720 mg  - Continue 1 mg folic acid daily on non methotrexate days.   - Continue dupilumab (DUPIXENT) 300 mg/2 mL Syrg injection every 7 days.   - Continue triamcinolone (KENALOG) 0.1 % ointment BID. Refilled today.  - Continue clobetasoL (TEMOVATE) 0.05 % ointment BID to thicker areas of rash, avoid face and skin folds. Use under white gloves at night  - Recommended to continue use of heavy emollients, prefer cerave and vaseline.  - Use rubber gloves when washing dishes  - Unscented soaps for washing hands after using the bathroom     High risk medication use (methotrexate)  - Quantiferon gold negative 06/20/21  - Labs drawn 08/22/2021 WNL: CBC with diff, AST ALT BUN Cr.  Recheck today.   - Labs ordered today: CBC w/ diff, BUN, Creatinine, AST, ALT    The patient was advised to call for an appointment should any new, changing, or symptomatic lesions develop.     RTC: Return in about 3 months (around 04/25/2023) for Recheck with Janann August. or sooner as needed   _________________________________________________________________      Chief Complaint     Chief Complaint   Patient presents with    Dermatitis     Pt is here today for evaluation of hand dermatitis    Duration:for 3 weeks  Location:both hands  Current EA:[id number]  Status:better    Symptoms:Local-itching overlying the lesion and pain at or near the lesion            HPI     Barry Bond. is a 22 y.o. male who presents as a returning patient (last seen 08/22/2022) for follow up of eczema. Patient reports flaring on hands. Washes lots of dishes at home and uses scented soaps.    The patient denies any other new or changing lesions or areas of concern.     Pertinent Past Medical History     Problem List          Musculoskeletal and Integument    Atopic dermatitis - Primary     Past Medical History, Family History, Social History, Medication List, Allergies, and Problem List were reviewed in the rooming section of Epic.     ROS: Other than symptoms mentioned in the HPI, no fevers, chills, or other skin complaints    Physical Examination     GENERAL: Well-appearing male in no acute distress, resting comfortably.  NEURO: Alert and oriented, answers questions appropriately  PSYCH: Normal mood and affect  EYES: lids clear, conjunctiva pink, no scleral icterus or other color change  RESP: No increased work of breathing  SKIN (Sun exposed Exam): Per patient request, examination of the face, neck, scalp, bilateral upper extremities, palms, and fingernails was performed    Scaling and erythema of AC and popliteal fossae  Scaling and erythema of bilateral palms  Some scale and erythema of the face    All areas not commented on are within normal limits  or unremarkable    (Approved Template 03/07/2020)

## 2023-01-23 NOTE — Unmapped (Signed)
[facility] Refill Coordination Note    Specialty Medication(s) to be Shipped:   Inflammatory Disorders: Dupixent    Other medication(s) to be shipped: No additional medications requested for fill at this time     Barry Bond., DOB: April 19, 2001  Phone: There are no phone numbers on file.      All above HIPAA information was verified with patient.     Was a Nurse, learning disability used for this call? No    Completed refill call assessment today to schedule patient's medication shipment from the [facility] [phone]).  All relevant notes have been reviewed.     Specialty medication(s) and dose(s) confirmed: Regimen is correct and unchanged.   Changes to medications: Bon reports no changes at this time.  Changes to insurance: No  New side effects reported not previously addressed with a pharmacist or physician: None reported  Questions for the pharmacist: No    Confirmed patient received a Conservation officer, historic buildings and a Surveyor, mining with first shipment. The patient will receive a drug information handout for each medication shipped and additional FDA Medication Guides as required.       DISEASE/MEDICATION-SPECIFIC INFORMATION        For patients on injectable medications: Patient currently has 0 doses left.  Next injection is scheduled for 01/24/23 .    SPECIALTY MEDICATION ADHERENCE     Medication Adherence    Patient reported X missed doses in the last month: 0  Specialty Medication: DUPIXENT SYRINGE 300 mg/2 mL Syrg injection  Patient is on additional specialty medications: No  Patient is on more than two specialty medications: No  Any gaps in refill history greater than 2 weeks in the last 3 months: no  Demonstrates understanding of importance of adherence: yes  Support network for adherence: family member                Were doses missed due to medication being on hold? No    Dupixent  300/2 mg/ml: 0 days of medicine on hand         REFERRAL TO PHARMACIST     Referral to the pharmacist: Not needed      [facility]     Shipping address confirmed in Epic.       Delivery Scheduled: Yes, Expected medication delivery date: 01/24/23 .     Medication will be delivered via UPS to the prescription address in Epic WAM.    Ricci Barker   [facility] Specialty Technician

## 2023-01-23 NOTE — Unmapped (Signed)
Use white cotton gloves with clobetasol underneath at bedtime    Use rubber gloves when washing dishes    Unscented soaps for washing hands after using the bathroom

## 2023-01-24 ENCOUNTER — Ambulatory Visit: Payer: Medicaid Other

## 2023-01-30 ENCOUNTER — Ambulatory Visit (INDEPENDENT_AMBULATORY_CARE_PROVIDER_SITE_OTHER): Payer: Medicaid Other

## 2023-01-30 DIAGNOSIS — J4540 Moderate persistent asthma, uncomplicated: Secondary | ICD-10-CM | POA: Diagnosis not present

## 2023-02-06 ENCOUNTER — Ambulatory Visit (INDEPENDENT_AMBULATORY_CARE_PROVIDER_SITE_OTHER): Payer: Medicaid Other

## 2023-02-06 DIAGNOSIS — L209 Atopic dermatitis, unspecified: Secondary | ICD-10-CM | POA: Diagnosis not present

## 2023-02-11 ENCOUNTER — Ambulatory Visit: Payer: Medicaid Other

## 2023-02-14 NOTE — Unmapped (Signed)
[facility] Refill Coordination Note    Specialty Medication(s) to be Shipped:   Inflammatory Disorders: Dupixent    Other medication(s) to be shipped:  Methotrexate 2.5 mg tablets, clobetasol 0.05% ointment     Barry Bond., DOB: 19-May-2001  Phone: There are no phone numbers on file.      All above HIPAA information was verified with patient.     Was a Nurse, learning disability used for this call? No    Completed refill call assessment today to schedule patient's medication shipment from the [facility] [phone]).  All relevant notes have been reviewed.     Specialty medication(s) and dose(s) confirmed: Regimen is correct and unchanged.   Changes to medications: Barry Bond reports no changes at this time.  Changes to insurance: No  New side effects reported not previously addressed with a pharmacist or physician: None reported  Questions for the pharmacist: No    Confirmed patient received a Conservation officer, historic buildings and a Surveyor, mining with first shipment. The patient will receive a drug information handout for each medication shipped and additional FDA Medication Guides as required.       DISEASE/MEDICATION-SPECIFIC INFORMATION        For patients on injectable medications: Patient currently has 1 doses left.  Next injection is scheduled for 02/15/23 .    SPECIALTY MEDICATION ADHERENCE     Medication Adherence    Patient reported X missed doses in the last month: 0  Specialty Medication: dupilumab (DUPIXENT SYRINGE) 300 mg/2 mL Syrg injection  Patient is on additional specialty medications: No  Informant: patient  Support network for adherence: family member                Were doses missed due to medication being on hold? No    Dupixent  300/2 mg/ml: 1 dose of medicine on hand         REFERRAL TO PHARMACIST     Referral to the pharmacist: Not needed      [facility]     Shipping address confirmed in Epic.       Delivery Scheduled: Yes, Expected medication delivery date: 02/19/23 . Medication will be delivered via UPS to the prescription address in Epic WAM.    Barry Bond   [facility] Specialty Technician

## 2023-02-15 ENCOUNTER — Ambulatory Visit: Payer: Medicaid Other

## 2023-02-15 DIAGNOSIS — L209 Atopic dermatitis, unspecified: Secondary | ICD-10-CM

## 2023-02-18 MED FILL — METHOTREXATE SODIUM 2.5 MG TABLET: 28 days supply | Qty: 24 | Fill #2

## 2023-02-18 MED FILL — DUPIXENT 300 MG/2 ML SUBCUTANEOUS SYRINGE: SUBCUTANEOUS | 28 days supply | Qty: 8 | Fill #4

## 2023-02-18 MED FILL — CLOBETASOL 0.05 % TOPICAL OINTMENT: TOPICAL | 30 days supply | Qty: 120 | Fill #1

## 2023-02-22 ENCOUNTER — Ambulatory Visit: Payer: Medicaid Other

## 2023-02-22 ENCOUNTER — Ambulatory Visit (INDEPENDENT_AMBULATORY_CARE_PROVIDER_SITE_OTHER): Payer: Medicaid Other

## 2023-02-22 DIAGNOSIS — L209 Atopic dermatitis, unspecified: Secondary | ICD-10-CM | POA: Diagnosis not present

## 2023-02-28 ENCOUNTER — Ambulatory Visit (INDEPENDENT_AMBULATORY_CARE_PROVIDER_SITE_OTHER): Payer: Medicaid Other

## 2023-02-28 DIAGNOSIS — L209 Atopic dermatitis, unspecified: Secondary | ICD-10-CM

## 2023-03-06 ENCOUNTER — Ambulatory Visit (INDEPENDENT_AMBULATORY_CARE_PROVIDER_SITE_OTHER): Payer: Medicaid Other

## 2023-03-06 DIAGNOSIS — L209 Atopic dermatitis, unspecified: Secondary | ICD-10-CM | POA: Diagnosis not present

## 2023-03-12 DIAGNOSIS — L209 Atopic dermatitis, unspecified: Principal | ICD-10-CM

## 2023-03-12 MED ORDER — METHOTREXATE SODIUM 2.5 MG TABLET
ORAL_TABLET | 0 refills | 0.00000 days | Status: CP
Start: 2023-03-12 — End: ?
  Filled 2023-03-18: qty 24, 28d supply, fill #0

## 2023-03-12 NOTE — Unmapped (Signed)
[facility] Specialty Pharmacy Refill Coordination Note    Specialty Medication(s) to be Shipped:   Inflammatory Disorders: Dupixent    Other medication(s) to be shipped: methotrexate 2.5 mg     Barry Bond., DOB: [birth year]  Phone: There are no phone numbers on file.      All above HIPAA information was verified with patient.     Was a Nurse, learning disability used for this call? No    Completed refill call assessment today to schedule patient's medication shipment from the [facility] [phone]).  All relevant notes have been reviewed.     Specialty medication(s) and dose(s) confirmed: Regimen is correct and unchanged.   Changes to medications: [name] reports no changes at this time.  Changes to insurance: No  New side effects reported not previously addressed with a pharmacist or physician: None reported  Questions for the pharmacist: No    Confirmed patient received a Conservation officer, historic buildings and a Surveyor, mining with first shipment. The patient will receive a drug information handout for each medication shipped and additional FDA Medication Guides as required.       DISEASE/MEDICATION-SPECIFIC INFORMATION        For patients on injectable medications: Patient currently has 1 or 2 doses left.  Next injection is scheduled for 03/23/23 .    SPECIALTY MEDICATION ADHERENCE     Medication Adherence    Patient reported X missed doses in the last month: 0  Specialty Medication: dupilumab (DUPIXENT SYRINGE) 300 mg/2 mL Syrg injection  Patient is on additional specialty medications: No  Informant: patient  Support network for adherence: family member                Were doses missed due to medication being on hold? No    Dupixent  300/2 mg/ml: 1-2 doses of medicine on hand         REFERRAL TO PHARMACIST     Referral to the pharmacist: Not needed      [facility]     Shipping address confirmed in Epic.       Delivery Scheduled: Yes, Expected medication delivery date: 03/19/23 .     Medication will be delivered via UPS to the prescription address in Epic WAM.    [name]   [facility] Specialty Technician

## 2023-03-12 NOTE — Unmapped (Signed)
LOV: 01/23/23  FOV: 04/26/23  Refill for methotrexate - pended for you to review

## 2023-03-15 ENCOUNTER — Ambulatory Visit (INDEPENDENT_AMBULATORY_CARE_PROVIDER_SITE_OTHER): Payer: Medicaid Other

## 2023-03-15 DIAGNOSIS — L209 Atopic dermatitis, unspecified: Secondary | ICD-10-CM | POA: Diagnosis not present

## 2023-03-18 MED FILL — DUPIXENT 300 MG/2 ML SUBCUTANEOUS SYRINGE: SUBCUTANEOUS | 28 days supply | Qty: 8 | Fill #5

## 2023-03-22 ENCOUNTER — Ambulatory Visit (INDEPENDENT_AMBULATORY_CARE_PROVIDER_SITE_OTHER): Payer: Medicaid Other | Admitting: *Deleted

## 2023-03-22 DIAGNOSIS — L209 Atopic dermatitis, unspecified: Secondary | ICD-10-CM | POA: Diagnosis not present

## 2023-03-29 ENCOUNTER — Ambulatory Visit: Payer: Medicaid Other

## 2023-03-29 DIAGNOSIS — L209 Atopic dermatitis, unspecified: Secondary | ICD-10-CM

## 2023-04-04 ENCOUNTER — Ambulatory Visit (INDEPENDENT_AMBULATORY_CARE_PROVIDER_SITE_OTHER): Payer: Medicaid Other

## 2023-04-04 DIAGNOSIS — L209 Atopic dermatitis, unspecified: Secondary | ICD-10-CM | POA: Diagnosis not present

## 2023-04-08 NOTE — Unmapped (Signed)
[facility] Special[facility] Pharmacy Refill Coordination Note    Specialty Medication(s) to be Shipped:   Inflammatory Disorders: Dupixent    Other medication(s) to be shipped: methotrexate 2.5 mg     Barry Bond., DOB: 16-May-2001  Phone: There are no phone numbers on file.      All above HIPAA information was verified with patient.     Was a Nurse, learning disability used for this call? No    Completed refill call assessment today to schedule patient's medication shipment from the [facility] [phone]).  All relevant notes have been reviewed.     Specialty medication(s) and dose(s) confirmed: Regimen is correct and unchanged.   Changes to medications: [name] reports no changes at this time.  Changes to insurance: No  New side effects reported not previously addressed with a pharmacist or physician: None reported  Questions for the pharmacist: No    Confirmed patient received a Conservation officer, historic buildings and a Surveyor, mining with first shipment. The patient will receive a drug information handout for each medication shipped and additional FDA Medication Guides as required.       DISEASE/MEDICATION-SPECIFIC INFORMATION        For patients on injectable medications: Patient currently has 1 doses left.  Next injection is scheduled for 04/11/23 .    SPECIALTY MEDICATION ADHERENCE     Medication Adherence    Patient reported X missed doses in the last month: 0  Specialty Medication: dupilumab (DUPIXENT SYRINGE) 300 mg/2 mL Syrg injection  Patient is on additional specialty medications: Yes  Additional Specialty Medications: methotrexate 2.5 MG tablet  Patient Reported Additional Medication X Missed Doses in the Last Month: 0  Patient is on more than two specialty medications: No  Informant: patient  Support network for adherence: family member                Were doses missed due to medication being on hold? No    Dupixent  300/2 mg/ml: 1-2 doses of medicine on hand     methotrexate 2.5 MG tablet: 0 doses of medicine on hand    REFERRAL TO PHARMACIST     Referral to the pharmacist: Not needed      SHIPPING     Shipping address confirmed in Epic.       Delivery Scheduled: Yes, Expected medication delivery date: 04/16/23 .     Medication will be delivered via UPS to the prescription address in Epic WAM.    [name]   [facility] Specialty Technician

## 2023-04-11 ENCOUNTER — Ambulatory Visit: Payer: Medicaid Other

## 2023-04-11 DIAGNOSIS — L209 Atopic dermatitis, unspecified: Secondary | ICD-10-CM

## 2023-04-15 ENCOUNTER — Ambulatory Visit: Payer: Medicaid Other

## 2023-04-15 DIAGNOSIS — L209 Atopic dermatitis, unspecified: Secondary | ICD-10-CM

## 2023-04-15 MED FILL — METHOTREXATE SODIUM 2.5 MG TABLET: 28 days supply | Qty: 24 | Fill #1

## 2023-04-15 MED FILL — DUPIXENT 300 MG/2 ML SUBCUTANEOUS SYRINGE: SUBCUTANEOUS | 28 days supply | Qty: 8 | Fill #6

## 2023-04-22 ENCOUNTER — Ambulatory Visit: Payer: Medicaid Other

## 2023-04-26 NOTE — Unmapped (Unsigned)
Dermatology Note     Assessment and Plan:      Atopic Dermatitis, chronic and improving in severity ~50% BSA (hands are flaring)  - Has failed numerous previous therapies. Previously treated with pred w/ shown decreased bone density  - Has had an extensive work up for immune deficiency including immunoglobulins, lymphocyte markers, mitogens, vaccine response, genetic testing, and skin biopsy all pointing to normal eczema.  - Rinvoq on reserve if not remaining well controlled  Plan:  - Continue methotrexate 15 mg (6 tablets) every 7 days. methotrexate cumulative dose ~900 mg  - Continue folic acid 1 mg daily on non methotrexate days.   - Continue dupilumab (DUPIXENT) 300 mg/2 mL Syrg injection every 7 days.   - Continue triamcinolone (KENALOG) 0.1 % ointment BID.  - Continue clobetasoL (TEMOVATE) 0.05 % ointment BID to thicker areas of rash, avoid face and skin folds. Use under white gloves at night  - Recommended to continue use of heavy emollients, prefer cerave and vaseline.  - Use rubber gloves when washing dishes  - Unscented soaps for washing hands after using the bathroom     High risk medication use (methotrexate)  - Quantiferon gold negative 06/20/21  - CBC with diff, AST, ALT, BUN, Cr drawn on 01/23/2023 - WNL, notable for elevated LFTs, will continue to monitor.    There are no diagnoses linked to this encounter.    The patient was advised to call for an appointment should any new, changing, or symptomatic lesions develop.     RTC: No follow-ups on file. or sooner as needed   _________________________________________________________________      Chief Complaint     No chief complaint on file.      HPI     Barry Bond. is a 22 y.o. male who presents as a returning patient (last seen by Dr. Earney Mallet and Dr. Heloise Beecham on 01/23/2023) to Dermatology for follow up of atopic dermatitis. At last visit, patient was to continue methotrexate 15 mg weekly, folic acid 1 mg daily on non-methotrexate days, dupilumab 300 mg weekly, triamcinolone 0.1% ointment, and clobetasol 0.05% ointment.    Today, patient reports ***    The patient denies any other new or changing lesions or areas of concern.     Pertinent Past Medical History     Past Medical History, Family History, Social History, Medication List, Allergies, and Problem List were reviewed in the rooming section of Epic.     ROS: Other than symptoms mentioned in the HPI, no fevers, chills, or other skin complaints    Physical Examination     GENERAL: Well-appearing male in no acute distress, resting comfortably.  NEURO: Alert and oriented, answers questions appropriately  PSYCH: Normal mood and affect  {PE extent:75514}  {PE list:75421}  - ***    All areas not commented on are within normal limits or unremarkable    (Approved Template 03/07/2020)

## 2023-05-07 NOTE — Unmapped (Signed)
[facility] Specialty and Home Delivery Pharmacy Refill Coordination Note    Specialty Medication(s) to be Shipped:   Inflammatory Disorders: Dupixent    Other medication(s) to be shipped:  folic acid and methotrexate     Barry Bond., DOB: 08/24/2000  Phone: There are no phone numbers on file.      All above HIPAA information was verified with patient.     Was a Nurse, learning disability used for this call? No    Completed refill call assessment today to schedule patient's medication shipment from the [facility] Delivery [facility]  [phone]).  All relevant notes have been reviewed.     Specialty medication(s) and dose(s) confirmed: Regimen is correct and unchanged.   Changes to medications: Taim reports no changes at this time.  Changes to insurance: No  New side effects reported not previously addressed with a pharmacist or physician: None reported  Questions for the pharmacist: No    Confirmed patient received a Conservation officer, historic buildings and a Surveyor, mining with first shipment. The patient will receive a drug information handout for each medication shipped and additional FDA Medication Guides as required.       DISEASE/MEDICATION-SPECIFIC INFORMATION        For patients on injectable medications: Patient currently has 0 doses left.  Next injection is scheduled for 05/10/23.    SPECIALTY MEDICATION ADHERENCE     Medication Adherence    Patient reported X missed doses in the last month: 1  Specialty Medication: dupilumab (DUPIXENT SYRINGE) 300 mg/2 mL Syrg injection  Patient is on additional specialty medications: No  Informant: patient  Support network for adherence: family member              Were doses missed due to medication being on hold? No     dupilumab: DUPIXENT SYRINGE 300 mg/2 mL Syrg injection: 0 days of medicine on hand       REFERRAL TO PHARMACIST     Referral to the pharmacist: Not needed      [facility]     Shipping address confirmed in Epic.       Delivery Scheduled: Yes, Expected medication delivery date: 05/09/23.     Medication will be delivered via UPS to the prescription address in Epic WAM.    Craige Cotta   [facility] Specialty and Home Delivery Pharm[facility]  Specialty Technician

## 2023-05-08 MED FILL — FOLIC ACID 1 MG TABLET: 90 days supply | Qty: 90 | Fill #1

## 2023-05-08 MED FILL — DUPIXENT 300 MG/2 ML SUBCUTANEOUS SYRINGE: SUBCUTANEOUS | 28 days supply | Qty: 8 | Fill #7

## 2023-05-08 MED FILL — METHOTREXATE SODIUM 2.5 MG TABLET: 28 days supply | Qty: 24 | Fill #2

## 2023-05-09 ENCOUNTER — Ambulatory Visit (INDEPENDENT_AMBULATORY_CARE_PROVIDER_SITE_OTHER): Payer: Medicaid Other

## 2023-05-09 DIAGNOSIS — L209 Atopic dermatitis, unspecified: Secondary | ICD-10-CM | POA: Diagnosis not present

## 2023-05-15 ENCOUNTER — Ambulatory Visit: Admit: 2023-05-15 | Discharge: 2023-05-16 | Payer: BLUE CROSS/BLUE SHIELD

## 2023-05-15 DIAGNOSIS — Z79899 Other long term (current) drug therapy: Principal | ICD-10-CM

## 2023-05-15 DIAGNOSIS — L209 Atopic dermatitis, unspecified: Principal | ICD-10-CM

## 2023-05-15 MED ORDER — METHOTREXATE SODIUM 2.5 MG TABLET
ORAL_TABLET | 2 refills | 0.00000 days | Status: CP
Start: 2023-05-15 — End: ?

## 2023-05-15 MED ORDER — FOLIC ACID 1 MG TABLET
ORAL_TABLET | 3 refills | 0.00000 days | Status: CP
Start: 2023-05-15 — End: ?

## 2023-05-15 MED ORDER — CLOBETASOL 0.05 % TOPICAL OINTMENT
Freq: Two times a day (BID) | TOPICAL | 1 refills | 0.00000 days | Status: CP
Start: 2023-05-15 — End: ?

## 2023-05-15 NOTE — Unmapped (Signed)
Dermatology Note     Assessment and Plan:      Atopic Dermatitis, chronic, not at treatment goal but declined change in therapy ~70% BSA  - Has failed numerous previous therapies. Previously treated with prednisone w/ shown decreased bone density  - Has had an extensive work up for immune deficiency including immunoglobulins, lymphocyte markers, mitogens, vaccine response, genetic testing, and skin biopsy all pointing to normal eczema.  - Rinvoq on reserve if not improving  Plan:  - Continue methotrexate 15 mg (6 tablets) every 7 days. methotrexate cumulative dose ~900 mg.  - Continue folic acid 1 mg daily on non methotrexate days.  - Continue dupilumab (DUPIXENT) 300 mg/2 mL Syrg injection every 7 days.   - Continue triamcinolone (KENALOG) 0.1 % ointment BID.  - Continue clobetasoL (TEMOVATE) 0.05 % ointment BID to thicker areas of rash, avoid face and skin folds. Use under white gloves at night  - Recommended to continue use of heavy emollients, prefer cerave and vaseline.  - Use rubber gloves when washing dishes  - Unscented soaps for washing hands after using the bathroom    High risk medication use (methotrexate)  - Quantiferon gold negative 06/20/21  - CBC with diff, AST, ALT, BUN, Cr drawn on 01/23/2023 - WNL, notable for elevated LFTs, will continue to monitor.   - Will reorder labs at next visit    The patient was advised to call for an appointment should any new, changing, or symptomatic lesions develop.     RTC: Return in about 6 months (around 11/12/2023) for follow up of eczema. or sooner as needed   _________________________________________________________________      Chief Complaint     Chief Complaint   Patient presents with    Follow-up     Dermatitis- unchanged     HPI     Barry Bond. is a 22 y.o. male who presents as a returning patient (last seen by Dr. Earney Mallet and Dr. Heloise Beecham on 01/23/2023) to Dermatology for follow up of atopic dermatitis. At last visit, patient was to continue methotrexate 15 mg weekly, folic acid 1 mg daily on non-methotrexate days, dupilumab 300 mg weekly, triamcinolone 0.1% ointment, and clobetasol 0.05% ointment. History jointly provided by father and patient.    Eczema  - Notes his eczema has not improved with flares on his legs, lower back, hands, abdomen, and neck  - Endorses severe pruritus that causes difficult sleeping  - Denies flares in the scalp and on feet  - Currently using methotrexate and Dupixent  - Not using clobetasol cream or moisturizer often  - Denies excess ibuprofen or alcohol intake    The patient denies any other new or changing lesions or areas of concern.     Pertinent Past Medical History     Past Medical History, Family History, Social History, Medication List, Allergies, and Problem List were reviewed in the rooming section of Epic.     ROS: Other than symptoms mentioned in the HPI, no fevers, chills, or other skin complaints    Physical Examination     GENERAL: Well-appearing male in no acute distress, resting comfortably.  NEURO: Alert and oriented, answers questions appropriately  PSYCH: Normal mood and affect  SKIN (Waist Up Skin Exam): Per patient request, exam of the scalp, face, eyelids, lips, nose, ears, neck, chest, upper abdomen, back, arms, hands, and fingernails was performed, Legs  - Extensive erythematous and eczematous patches affecting the face, trunk and extremities     All areas  not commented on are within normal limits or unremarkable    Scribe's Attestation: Revonda Standard C. Judeth Porch, PA-C obtained and performed the history, physical exam and medical decision making elements that were entered into the chart.  Signed by Sharol Harness, Scribe, on May 15, 2023 at 2:50 PM.    ----------------------------------------------------------------------------------------------------------------------  May 15, 2023 3:41 PM. Documentation assistance provided by the Scribe. I was present during the time the encounter was recorded. The information recorded by the Scribe was done at my direction and has been reviewed and validated by me.  ----------------------------------------------------------------------------------------------------------------------      (Approved Template 03/07/2020)

## 2023-05-16 ENCOUNTER — Ambulatory Visit: Payer: Medicaid Other

## 2023-05-16 DIAGNOSIS — L209 Atopic dermatitis, unspecified: Secondary | ICD-10-CM | POA: Diagnosis not present

## 2023-05-20 ENCOUNTER — Ambulatory Visit (INDEPENDENT_AMBULATORY_CARE_PROVIDER_SITE_OTHER): Payer: Medicaid Other

## 2023-05-20 DIAGNOSIS — L209 Atopic dermatitis, unspecified: Secondary | ICD-10-CM

## 2023-05-29 ENCOUNTER — Ambulatory Visit: Payer: Medicaid Other

## 2023-05-29 DIAGNOSIS — L209 Atopic dermatitis, unspecified: Secondary | ICD-10-CM

## 2023-06-05 ENCOUNTER — Ambulatory Visit: Payer: Medicaid Other

## 2023-06-05 DIAGNOSIS — L209 Atopic dermatitis, unspecified: Secondary | ICD-10-CM

## 2023-06-07 NOTE — Unmapped (Signed)
[facility] Specialty and Home Delivery Pharmacy Refill Coordination Note    Specialty Medication(s) to be Shipped:   Inflammatory Disorders: Dupixent    Other medication(s) to be shipped: No additional medications requested for fill at this time     Barry Santillana., DOB: 12-25-2000  Phone: There are no phone numbers on file.      All above HIPAA information was verified with patient.     Was a Nurse, learning disability used for this call? No    Completed refill call assessment today to schedule patient's medication shipment from the [facility] and Home Delivery Pharmacy  [phone]).  All relevant notes have been reviewed.     Specialty medication(s) and dose(s) confirmed: Regimen is correct and unchanged.   Changes to medications: Oscer reports no changes at this time.  Changes to insurance: No  New side effects reported not previously addressed with a pharmacist or physician: None reported  Questions for the pharmacist: No    Confirmed patient received a Conservation officer, historic buildings and a Surveyor, mining with first shipment. The patient will receive a drug information handout for each medication shipped and additional FDA Medication Guides as required.       DISEASE/MEDICATION-SPECIFIC INFORMATION        For patients on injectable medications: Patient currently has 1 doses left.  Next injection is scheduled for 06/12/23.    SPECIALTY MEDICATION ADHERENCE     Medication Adherence    Patient reported X missed doses in the last month: 0  Specialty Medication: dupilumab (DUPIXENT SYRINGE) 300 mg/2 mL Syrg injection  Patient is on additional specialty medications: No  Informant: patient  Support network for adherence: family member              Were doses missed due to medication being on hold? No     dupilumab: DUPIXENT SYRINGE 300 mg/2 mL Syrg injection: 6 days of medicine on hand       REFERRAL TO PHARMACIST     Referral to the pharmacist: Not needed      [facility]     Shipping address confirmed in Epic.       Delivery Scheduled: Yes, Expected medication delivery date: 06/12/23.     Medication will be delivered via UPS to the prescription address in Epic WAM.    Barry Bond Specialty and [facility]

## 2023-06-11 MED FILL — DUPIXENT 300 MG/2 ML SUBCUTANEOUS SYRINGE: SUBCUTANEOUS | 28 days supply | Qty: 8 | Fill #8

## 2023-06-12 ENCOUNTER — Ambulatory Visit (INDEPENDENT_AMBULATORY_CARE_PROVIDER_SITE_OTHER): Payer: Medicaid Other

## 2023-06-12 DIAGNOSIS — L209 Atopic dermatitis, unspecified: Secondary | ICD-10-CM | POA: Diagnosis not present

## 2023-06-18 ENCOUNTER — Ambulatory Visit (INDEPENDENT_AMBULATORY_CARE_PROVIDER_SITE_OTHER): Payer: Medicaid Other

## 2023-06-18 DIAGNOSIS — L209 Atopic dermatitis, unspecified: Secondary | ICD-10-CM | POA: Diagnosis not present

## 2023-07-02 ENCOUNTER — Ambulatory Visit: Payer: Medicaid Other

## 2023-07-03 ENCOUNTER — Ambulatory Visit: Payer: Medicaid Other

## 2023-07-03 DIAGNOSIS — L209 Atopic dermatitis, unspecified: Secondary | ICD-10-CM

## 2023-07-10 ENCOUNTER — Ambulatory Visit: Payer: Medicaid Other

## 2023-07-10 DIAGNOSIS — L209 Atopic dermatitis, unspecified: Secondary | ICD-10-CM

## 2023-07-11 NOTE — Unmapped (Signed)
[facility] Specialty and Home Delivery Pharmacy Refill Coordination Note    Specialty Medication(s) to be Shipped:   Inflammatory Disorders: Dupixent    Other medication(s) to be shipped: No additional medications requested for fill at this time     Barry Bond., DOB: 2001-02-24  Phone: There are no phone numbers on file.      All above HIPAA information was verified with patient.     Was a Nurse, learning disability used for this call? No    Completed refill call assessment today to schedule patient's medication shipment from the [facility] and Home Delivery Pharmacy  [phone]).  All relevant notes have been reviewed.     Specialty medication(s) and dose(s) confirmed: Regimen is correct and unchanged.   Changes to medications: Barry Bond reports no changes at this time.  Changes to insurance: No  New side effects reported not previously addressed with a pharmacist or physician: None reported  Questions for the pharmacist: No    Confirmed patient received a Conservation officer, historic buildings and a Surveyor, mining with first shipment. The patient will receive a drug information handout for each medication shipped and additional FDA Medication Guides as required.       DISEASE/MEDICATION-SPECIFIC INFORMATION        For patients on injectable medications: Patient currently has 1 doses left.  Next injection is scheduled for 07/16/22.    SPECIALTY MEDICATION ADHERENCE     Medication Adherence    Patient reported X missed doses in the last month: 0  Specialty Medication: dupilumab (DUPIXENT SYRINGE) 300 mg/2 mL Syrg injection  Patient is on additional specialty medications: No  Informant: patient  Support network for adherence: family member              Were doses missed due to medication being on hold? No     dupilumab: DUPIXENT SYRINGE 300 mg/2 mL Syrg injection: 6 days of medicine on hand       REFERRAL TO PHARMACIST     Referral to the pharmacist: Not needed      [facility]     Shipping address confirmed in Epic.       Delivery Scheduled: Yes, Expected medication delivery date: 07/18/22.     Medication will be delivered via UPS to the prescription address in Epic WAM.    [name] Specialty and [facility]

## 2023-07-17 ENCOUNTER — Ambulatory Visit: Payer: Medicaid Other

## 2023-07-17 DIAGNOSIS — L209 Atopic dermatitis, unspecified: Secondary | ICD-10-CM

## 2023-07-18 MED FILL — DUPIXENT 300 MG/2 ML SUBCUTANEOUS SYRINGE: SUBCUTANEOUS | 28 days supply | Qty: 8 | Fill #9

## 2023-07-24 ENCOUNTER — Ambulatory Visit (INDEPENDENT_AMBULATORY_CARE_PROVIDER_SITE_OTHER): Payer: Medicaid Other

## 2023-07-24 ENCOUNTER — Ambulatory Visit: Payer: Medicaid Other

## 2023-07-24 DIAGNOSIS — L209 Atopic dermatitis, unspecified: Secondary | ICD-10-CM

## 2023-07-31 ENCOUNTER — Ambulatory Visit: Payer: Medicaid Other

## 2023-07-31 DIAGNOSIS — L209 Atopic dermatitis, unspecified: Secondary | ICD-10-CM

## 2023-07-31 NOTE — Unmapped (Signed)
[facility] Specialty and Home Delivery Pharmacy Refill Coordination Note    Specialty Medication(s) to be Shipped:   Inflammatory Disorders: Dupixent    Other medication(s) to be shipped: No additional medications requested for fill at this time     Barry Bond., DOB: Oct 03, 2000  Phone: There are no phone numbers on file.      All above HIPAA information was verified with patient.     Was a Nurse, learning disability used for this call? No    Completed refill call assessment today to schedule patient's medication shipment from the [facility] and Home Delivery Pharmacy  [phone]).  All relevant notes have been reviewed.     Specialty medication(s) and dose(s) confirmed: Regimen is correct and unchanged.   Changes to medications: Barry Bond reports no changes at this time.  Changes to insurance: No  New side effects reported not previously addressed with a pharmacist or physician: None reported  Questions for the pharmacist: No    Confirmed patient received a Conservation officer, historic buildings and a Surveyor, mining with first shipment. The patient will receive a drug information handout for each medication shipped and additional FDA Medication Guides as required.       DISEASE/MEDICATION-SPECIFIC INFORMATION        For patients on injectable medications: Patient currently has 1 doses left.  Next injection is scheduled for 2/12.    SPECIALTY MEDICATION ADHERENCE     Medication Adherence    Patient reported X missed doses in the last month: 0  Specialty Medication: dupilumab (DUPIXENT SYRINGE) 300 mg/2 mL Syrg injection  Patient is on additional specialty medications: No  Informant: patient  Support network for adherence: family member              Were doses missed due to medication being on hold? No     dupilumab: DUPIXENT SYRINGE 300 mg/2 mL Syrg injection: 7 days of medicine on hand       REFERRAL TO PHARMACIST     Referral to the pharmacist: Not needed      [facility]     Shipping address confirmed in Epic.       Delivery Scheduled: Yes, Expected medication delivery date: 2/13.     Medication will be delivered via UPS to the prescription address in Epic WAM.    Barry Bond Specialty and [facility]

## 2023-08-06 ENCOUNTER — Ambulatory Visit: Payer: Medicaid Other

## 2023-08-06 DIAGNOSIS — L209 Atopic dermatitis, unspecified: Secondary | ICD-10-CM | POA: Diagnosis not present

## 2023-08-07 MED FILL — DUPIXENT 300 MG/2 ML SUBCUTANEOUS SYRINGE: SUBCUTANEOUS | 28 days supply | Qty: 8 | Fill #10

## 2023-08-12 ENCOUNTER — Ambulatory Visit: Payer: Medicaid Other

## 2023-08-16 ENCOUNTER — Ambulatory Visit: Admit: 2023-08-16 | Discharge: 2023-08-17 | Payer: BLUE CROSS/BLUE SHIELD

## 2023-08-16 DIAGNOSIS — L209 Atopic dermatitis, unspecified: Principal | ICD-10-CM

## 2023-08-16 DIAGNOSIS — Z79899 Other long term (current) drug therapy: Principal | ICD-10-CM

## 2023-08-16 LAB — CREATININE
CREATININE: 0.59 mg/dL — ABNORMAL LOW (ref 0.73–1.18)
EGFR CKD-EPI (2021) MALE: >90 mL/min/{1.73_m2} (ref >=60)

## 2023-08-16 LAB — CBC W/ AUTO DIFF
BASOPHILS ABSOLUTE COUNT: 0 10*9/L (ref 0.0–0.1)
BASOPHILS RELATIVE PERCENT: 0.5 %
EOSINOPHILS ABSOLUTE COUNT: 0.9 10*9/L — ABNORMAL HIGH (ref 0.0–0.5)
EOSINOPHILS RELATIVE PERCENT: 10.5 %
HEMATOCRIT: 46.6 % (ref 39.0–48.0)
HEMOGLOBIN: 15.8 g/dL (ref 12.9–16.5)
LYMPHOCYTES ABSOLUTE COUNT: 1.8 10*9/L (ref 1.1–3.6)
LYMPHOCYTES RELATIVE PERCENT: 21.8 %
MEAN CORPUSCULAR HEMOGLOBIN CONC: 33.9 g/dL (ref 32.0–36.0)
MEAN CORPUSCULAR HEMOGLOBIN: 29 pg (ref 25.9–32.4)
MEAN CORPUSCULAR VOLUME: 85.5 fL (ref 77.6–95.7)
MEAN PLATELET VOLUME: 8.8 fL (ref 6.8–10.7)
MONOCYTES ABSOLUTE COUNT: 0.5 10*9/L (ref 0.3–0.8)
MONOCYTES RELATIVE PERCENT: 5.7 %
NEUTROPHILS ABSOLUTE COUNT: 5.2 10*9/L (ref 1.8–7.8)
NEUTROPHILS RELATIVE PERCENT: 61.5 %
PLATELET COUNT: 321 10*9/L (ref 150–450)
RED BLOOD CELL COUNT: 5.45 10*12/L (ref 4.26–5.60)
RED CELL DISTRIBUTION WIDTH: 13.1 % (ref 12.2–15.2)
WBC ADJUSTED: 8.5 10*9/L (ref 3.6–11.2)

## 2023-08-16 LAB — LIPID PANEL
CHOLESTEROL/HDL RATIO SCREEN: 3.8 (ref 1.0–4.5)
CHOLESTEROL: 195 mg/dL (ref <=200)
HDL CHOLESTEROL: 52 mg/dL (ref 40–60)
LDL CHOLESTEROL CALCULATED: 92 mg/dL (ref 40–99)
NON-HDL CHOLESTEROL: 143 mg/dL — ABNORMAL HIGH (ref 70–130)
TRIGLYCERIDES: 255 mg/dL — ABNORMAL HIGH (ref 0–150)
VLDL CHOLESTEROL CAL: 51 mg/dL — ABNORMAL HIGH (ref 9–40)

## 2023-08-16 LAB — ALT: ALT (SGPT): 26 U/L (ref 10–49)

## 2023-08-16 LAB — BUN: BLOOD UREA NITROGEN: 12 mg/dL (ref 9–23)

## 2023-08-16 LAB — AST: AST (SGOT): 21 U/L (ref <=34)

## 2023-08-16 MED ORDER — PREDNISONE 10 MG TABLET
ORAL_TABLET | ORAL | 0 refills | 21.00 days | Status: CP
Start: 2023-08-16 — End: 2023-09-06

## 2023-08-16 MED ORDER — RINVOQ 15 MG TABLET,EXTENDED RELEASE
ORAL_TABLET | Freq: Every day | ORAL | 2 refills | 0.00 days | Status: CP
Start: 2023-08-16 — End: ?
  Filled 2023-08-21: qty 30, 30d supply, fill #0

## 2023-08-16 NOTE — Unmapped (Addendum)
 For your eczema:  - Stop methotrexate and Dupixent once you obtain Rinvoq   - Take 1 tablet of Rinvoq daily  - Apply triamcinolone ointment twice daily as needed for flares  - Apply clobetasol ointment twice daily as needed to stubborn flares

## 2023-08-16 NOTE — Unmapped (Signed)
 Dermatology Note     Assessment and Plan:      Atopic Dermatitis, chronic, not at treatment goal on methotrexate and Dupixent ~70% BSA  - Has failed numerous previous therapies. Previously treated with prednisone w/ shown decreased bone density  - Has had an extensive work up for immune deficiency including immunoglobulins, lymphocyte markers, mitogens, vaccine response, genetic testing, and skin biopsy all pointing to normal eczema.  - Discussed initiating Rinvoq today. R/B/A reviewed including infections, acne, elevated lipids, and thrombolysis       Plan:  - Start upadacitinib (RINVOQ) 15 mg Tb24; Take 15 mg by mouth daily.  - Patient may start as needed for flares while switching medications  - predniSONE (DELTASONE) 10 MG tablet; Take 4 tablets (40 mg total) by mouth daily for 7 days, THEN 2 tablets (20 mg total) daily for 7 days, THEN 1 tablet (10 mg total) daily for 7 days.  - Continue triamcinolone (KENALOG) 0.1 % ointment BID.  - Continue clobetasoL (TEMOVATE) 0.05 % ointment BID to thicker areas of rash, avoid face and skin folds. Use under white gloves at night  - Stop methotrexate 15 mg (6 tablets) every 7 days and folic acid. methotrexate cumulative dose ~1100 mg WHEN starting rinvoq. Recommend 1 week washout  - Stop dupilumab (DUPIXENT) 300 mg/2 mL Syrg injection every 7 days when starting Rinvoq. No washout needed.  - Continue heavy emollients, prefer cerave and vaseline.  - Use rubber gloves when washing dishes  - Unscented soaps for washing hands after using the bathroom    High risk medication use (methotrexate)  - Quantiferon gold negative 06/20/21  - CBC with diff, AST, ALT, BUN, Cr drawn on 01/23/2023 - WNL, notable for elevated LFTs, will continue to monitor.              - Labs reordered today    The patient was advised to call for an appointment should any new, changing, or symptomatic lesions develop.     RTC: Return in about 6 weeks (around 09/27/2023) for follow up of eczema. or sooner as needed   _________________________________________________________________      Chief Complaint     Follow up of eczema    HPI     Barry Bond. is a 23 y.o. male who presents as a returning patient (last seen by Lottie Dawson, PA on 05/15/2023) to Dermatology for follow up of eczema. At last visit, patient was to continue methotrexate 15 mg every 7 days, folic acid 1 mg on non methotrexate days, dupilumab 300 mg every 7 days, triamcinolone 0.1% ointment, and clobetasol 0.05% ointment for atopic dermatitis. Patient accompanied by father today.    Eczema  - Notes eczema is the same with flares all over  - Endorses occasional pruritus, denies itch today  - Currently taking methotrexate, tolerating well  - Wakes up scratching occasionally   - Dad notes patient having bad flare in the past while switching medications    The patient denies any other new or changing lesions or areas of concern.     Pertinent Past Medical History     Past Medical History, Family History, Social History, Medication List, Allergies, and Problem List were reviewed in the rooming section of Epic.     ROS: Other than symptoms mentioned in the HPI, no fevers, chills, or other skin complaints    Physical Examination     GENERAL: Well-appearing male in no acute distress, resting comfortably.  NEURO: Alert and oriented, answers questions  appropriately  PSYCH: Normal mood and affect  SKIN: Examination of the face, neck, chest, abdomen, back, bilateral upper extremities, and hands was performed  - Extensive confluent erythematous scaly patches of the face, arms, neck > trunk and legs, 70% BSA     All areas not commented on are within normal limits or unremarkable    Scribe's Attestation: Revonda Standard C. Judeth Porch, PA-C obtained and performed the history, physical exam and medical decision making elements that were entered into the chart.  Signed by Sharol Harness, Scribe, on August 16, 2023 at 2:28 PM.    ----------------------------------------------------------------------------------------------------------------------  August 19, 2023 8:49 AM. Documentation assistance provided by the Scribe. I was present during the time the encounter was recorded. The information recorded by the Scribe was done at my direction and has been reviewed and validated by me.  ----------------------------------------------------------------------------------------------------------------------       (Approved Template 03/07/2020)

## 2023-08-19 LAB — QUANTIFERON TB GOLD PLUS
QUANTIFERON ANTIGEN 1 MINUS NIL: 0 [IU]/mL
QUANTIFERON ANTIGEN 2 MINUS NIL: 0 [IU]/mL
QUANTIFERON MITOGEN: 9.99 [IU]/mL
QUANTIFERON TB GOLD PLUS: NEGATIVE
QUANTIFERON TB NIL VALUE: 0.01 [IU]/mL

## 2023-08-19 LAB — TB NIL: TB NIL VALUE: 0.01

## 2023-08-19 LAB — TB MITOGEN: TB MITOGEN VALUE: 10

## 2023-08-19 LAB — TB AG2: TB AG2 VALUE: 0.01

## 2023-08-19 LAB — TB AG1: TB AG1 VALUE: 0.01

## 2023-08-19 NOTE — Unmapped (Signed)
 [facility] Specialty Medication Onboarding    Specialty Medication: Rinvoq  Prior Authorization: Approved   Financial Assistance: No - copay  <$25  Final Copay/Day Supply: $4 / 30 days    Insurance Restrictions: None     Notes to Pharmacist:   Credit Card on File: yes  Start Date on Rx:      The triage team has completed the benefits investigation and has determined that the patient is able to fill this medication at [facility]. Please contact the patient to complete the onboarding or follow up with the prescribing physician as needed.

## 2023-08-20 NOTE — Unmapped (Incomplete Revision)
 I reviewed this patient case and all documentation provided by the learner and was readily available for consultation during their interaction with the patient.  I agree with the assessment and plan listed below.    Marnisha Stampley [facility] Specialty Pharmacist          [facility]    Patient Onboarding/Medication Counseling    Barry Bond is a 23 y.o. male with atopic dermatitis who I am counseling today on initiation of therapy.  I am speaking to the patient.    Was a Nurse, learning disability used for this call? No    Verified patient's date of birth / HIPAA.    Specialty medication(s) to be sent: Inflammatory Disorders: Rinvoq      Non-specialty medications/supplies to be sent: None      Medications not needed at this time: None         Rinvoq (upadacitinib)    Medication & Administration     Dosage: Atopic dermatitis: 15 mg by mouth once daily; may increase to 30 mg once daily if inadequate response. Discontinue if an adequate response is not achieved with the 30 mg dose; use the lowest effective dose needed to maintain response    Lab tests required prior to treatment initiation:  Tuberculosis: Tuberculosis screening resulted in a non-reactive Quantiferon TB Gold assay.  Hepatitis B: Hepatitis B serology studies are complete and non-reactive.  Absolute lymphocyte count: ALC greater than 500/mm3.  Absolute neutrophil count: ANC greater than 1,000/mm3.  Hemoglobin: Hemoglobin is greater than 8 g/dL.  Liver function tests: Baseline ALT and AST were evaluated and are documented in the patient chart prior to treatment initiation.  Pregnancy: Patient is male: screening not applicable.      Administration: Take tablets with or without food.  Swallow tablets whole; do not chew, break, or crush.    Adherence/Missed dose instructions:  Take a missed dose as soon as you think about it.  If it has been more than 8-12 hours since your normal dosing time, skip the missed dose and go back to your normal time the following day.  Never take 2 doses at the same time or extra doses in an attempt to 'catch up' after a missed dose.    Goals of Therapy     Achieve symptom remission  Slow disease progression  Protection of remaining articular structures  Maintenance of function  Maintenance of effective psychosocial functioning    Side Effects & Monitoring Parameters     Upset stomach  Signs of a common cold - minor sore throat, runny or stuffy nose, etc.    The following side effects should be reported to the provider:  Signs of a hypersensitivity reaction - rash; hives; itching; red, swollen, blistered, or peeling skin; wheezing; tightness in the chest or throat; difficulty breathing, swallowing, or talking; swelling of the mouth, face, lips, tongue, or throat; etc.  Reduced immune function - report signs of infection such as fever; chills; body aches; very bad sore throat; ear or sinus pain; cough; more sputum or change in color of sputum; pain with passing urine; wound that will not heal, etc.  Also at a slightly higher risk of some malignancies (mainly skin and blood cancers) due to this reduced immune function.  A new skin growth or lump  Stomach pain that is new or worse or change in bowel habits  Signs of a blood clot - chest pain or pressure; coughing up  blood; shortness of breath; swelling, warmth, numbness, or pain in a leg or arm      Contraindications, Warnings, & Precautions     Have your bloodwork checked as you have been told by your prescriber  Talk with your doctor if you are pregnant, planning to become pregnant, or breastfeeding - women taking this medication must use birth control while taking this drug and for some time after the last dose  Discuss the possible need for holding your dose(s) of Rinvoq?? when a planned procedure is scheduled with the prescriber as it may delay healing/recovery timeline       Drug/Food Interactions     Medication list reviewed in Epic. The patient was instructed to inform the care team before taking any new medications or supplements.  Methotraxate, rinvoq, prednisone . Per note by Lottie Dawson, I have confirmed and reviewed with the patient to stop dupixent (no washout) and methotrexate (7 day washout) before starting rinvoq.Last dose of methotrexate was 2/18. He denies using prednisone for flares at this time.   Talk with you prescriber or pharmacist before receiving any live vaccinations while taking this medication and after you stop taking it    Storage, Handling Precautions, & Disposal     Store in the original container at room temperature and protect from moisture.      Current Medications (including OTC/herbals), Comorbidities and Allergies     Current Outpatient Medications   Medication Sig Dispense Refill    acetaminophen-codeine (TYLENOL #3) 300-30 mg per tablet Take 1 tablet by mouth every four (4) hours as needed for pain.      albuterol HFA 90 mcg/actuation inhaler Inhale 2 puffs.      calcium carbonate-vitamin D3 600 mg(1,500mg ) -800 unit Tab TAKE 1 TABLET BY MOUTH EVERY DAY 90 tablet 3    cetirizine (ZYRTEC) 10 MG tablet 1 tablet once a day if needed for runny nose or itchy eyes      clobetasol (TEMOVATE) 0.05 % ointment Apply topically Two (2) times a day. 120 g 1    dupilumab (DUPIXENT SYRINGE) 300 mg/2 mL Syrg injection Inject the contents of 1 syringe (300 mg total) under the skin every seven (7) days. 8 mL 11    EPINEPHrine (EPIPEN) 0.3 mg/0.3 mL injection Inject 0.3 mL (0.3 mg total) into the muscle once. for 1 dose In case of anaphylaxis 2 each 3    fluticasone propion-salmeterol (ADVAIR, WIXELA) 250-50 mcg/dose diskus Inhale 1 puff.      fluticasone propionate (FLONASE) 50 mcg/actuation nasal spray 2 spray per nostril once a day if needed for stuffy nose.      folic acid (FOLVITE) 1 MG tablet Please take 1 tablet (1 mg) on days that you are not taking Methotrexate 90 tablet 3    methotrexate 2.5 MG tablet Take 6 tablets by mouth once weekly 30 tablet 2    predniSONE (DELTASONE) 10 MG tablet Take 4 tablets (40 mg total) by mouth daily for 7 days, THEN 2 tablets (20 mg total) daily for 7 days, THEN 1 tablet (10 mg total) daily for 7 days. 49 tablet 0    triamcinolone (KENALOG) 0.1 % ointment Apply topically two (2) times a day to flares as needed 454 g 1    upadacitinib (RINVOQ) 15 mg Tb24 Take 1 tablet (15 mg) by mouth daily. 30 tablet 2     No current facility-administered medications for this visit.       Allergies   Allergen Reactions  Benadryl [Diphenhydramine Hcl] Swelling    Peanut Swelling and Other (See Comments)     Face    Peanut Oil Anaphylaxis     Pt has an epi pen  Pt has an epi pen    Soy Anaphylaxis    Cetirizine Other (See Comments)     Other reaction(s): Unknown    Coughing and eczema flares    Cyproheptadine Other (See Comments)     Other Reaction: Other reaction    Fish Containing Products Other (See Comments)     Unknown  Other reaction(s): UNKNOWN    Hydroxyzine Other (See Comments)    Lentils     Shellfish Containing Products Cough     Unknown    Nickel Rash       Patient Active Problem List   Diagnosis    Atopic dermatitis    Moderate persistent asthma without complication    Osteopenia       Medication list has been reviewed and updated in Epic: Yes    Allergies have been reviewed and updated in Epic: Yes    Appropriateness of Therapy     Acute infections noted within Epic:  No active infections  Patient reported infection: None    Is the medication and dose appropriate based on diagnosis, medication list, comorbidities, allergies, medical history, patient???s ability to self-administer the medication, and therapeutic goals? Yes    Prescription has been clinically reviewed: Yes      Baseline Quality of Life Assessment      How many days over the past month did your AD  keep you from your normal activities? For example, brushing your teeth or getting up in the morning. 0    Financial Information     Medication Assistance provided: Prior Authorization    Anticipated copay of $4 reviewed with patient. Verified delivery address.    Delivery Information     Scheduled delivery date: 2/27    Expected start date: ASAP      Medication will be delivered via UPS to the prescription address in [facility].  This shipment will not require a signature.      Explained the services we provide at [facility] and that each month we would call to set up refills.  Stressed importance of returning phone calls so that we could ensure they receive their medications in time each month.  Informed patient that we should be setting up refills 7-10 days prior to when they will run out of medication.  A pharmacist will reach out to perform a clinical assessment periodically.  Informed patient that a welcome packet, containing information about our pharmacy and other support services, a Notice of Privacy Practices, and a drug information handout will be sent.      The patient or caregiver noted above participated in the development of this care plan and knows that they can request review of or adjustments to the care plan at any time.      Patient or caregiver verbalized understanding of the above information as well as how to contact the pharmacy at [phone] option 4 with any questions/concerns.  The pharmacy is open Monday through Friday 8:30am-4:30pm.  A pharmacist is available 24/7 via pager to answer any clinical questions they may have.    Patient Specific Needs     Does the patient have any physical, cognitive, or cultural barriers? No    Does the patient have adequate living arrangements? (i.e. the ability to store and  take their medication appropriately) Yes    Did you identify any home environmental safety or security hazards? No    Patient prefers to have medications discussed with  Patient     Is the patient or caregiver able to read and understand education materials at a high school level or above? Yes    Patient's primary language is  English     Is the patient high risk? No    Does the patient have an additional or emergency contact listed in their chart? Yes    SOCIAL DETERMINANTS OF HEALTH     At the [facility], we have learned that life circumstances - like trouble affording food, housing, utilities, or transportation can affect the health of many of our patients.   That is why we wanted to ask: are you currently experiencing any life circumstances that are negatively impacting your health and/or quality of life? Patient declined to answer    Social Drivers of Health     Food Insecurity: Not on file   Internet Connectivity: Not on file   Housing/Utilities: Unknown (10/18/2021)    Housing/Utilities     Within the past 12 months, have you ever stayed: outside, in a car, in a tent, in an overnight shelter, or temporarily in someone else's home (i.e. couch-surfing)?: No     Are you worried about losing your housing?: Not on file     Within the past 12 months, have you been unable to get utilities (heat, electricity) when it was really needed?: Not on file   Tobacco Use: Low Risk  (08/19/2023)    Patient History     Smoking Tobacco Use: Never     Smokeless Tobacco Use: Never     Passive Exposure: Not on file   Transportation Needs: Not on file   Alcohol Use: Not on file   Interpersonal Safety: Not on file   Physical Activity: Not on file   Intimate Partner Violence: Not on file   Stress: Not on file   Substance Use: Not on file (05/05/2023)   Social Connections: Not on file   Financial Resource Strain: Not on file   Depression: Not on file   Health Literacy: Low Risk  (10/18/2021)    Health Literacy     : Never       Would you be willing to receive help with any of the needs that you have identified today? Not applicable       Norva Karvonen  [facility] Specialty Pharmacist

## 2023-08-20 NOTE — Unmapped (Addendum)
 I reviewed this patient case and all documentation provided by the learner and was readily available for consultation during their interaction with the patient.  I agree with the assessment and plan listed below.    [facility]arn[facility] Specialty Pharmacist          [facility]    Patient Onboarding/Medication Counseling    Mr.Barry Bond is a 23 y.o. male with atopic dermatitis who I am counseling today on initiation of therapy.  I am speaking to the patient.    Was a Nurse, learning disability used for this call? No    Verified patient's date of birth / HIPAA.    Specialty medication(s) to be sent: Inflammatory Disorders: Rinvoq      Non-specialty medications/supplies to be sent: None      Medications not needed at this time: None         Rinvoq (upadacitinib)    Medication & Administration     Dosage: Atopic dermatitis: 15 mg by mouth once daily; may increase to 30 mg once daily if inadequate response. Discontinue if an adequate response is not achieved with the 30 mg dose; use the lowest effective dose needed to maintain response    Lab tests required prior to treatment initiation:  Tuberculosis: Tuberculosis screening resulted in a non-reactive Quantiferon TB Gold assay.  Hepatitis B: Hepatitis B serology studies are complete and non-reactive.  Absolute lymphocyte count: ALC greater than 500/mm3.  Absolute neutrophil count: ANC greater than 1,000/mm3.  Hemoglobin: Hemoglobin is greater than 8 g/dL.  Liver function tests: Baseline ALT and AST were evaluated and are documented in the patient chart prior to treatment initiation.  Pregnancy: Patient is male: screening not applicable.      Administration: Take tablets with or without food.  Swallow tablets whole; do not chew, break, or crush.    Adherence/Missed dose instructions:  Take a missed dose as soon as you think about it.  If it has been more than 8-12 hours since your normal dosing time, skip the missed dose and go back to your normal time the following day.  Never take 2 doses at the same time or extra doses in an attempt to 'catch up' after a missed dose.    Goals of Therapy     Achieve symptom remission  Slow disease progression  Protection of remaining articular structures  Maintenance of function  Maintenance of effective psychosocial functioning    Side Effects & Monitoring Parameters     Upset stomach  Signs of a common cold - minor sore throat, runny or stuffy nose, etc.    The following side effects should be reported to the provider:  Signs of a hypersensitivity reaction - rash; hives; itching; red, swollen, blistered, or peeling skin; wheezing; tightness in the chest or throat; difficulty breathing, swallowing, or talking; swelling of the mouth, face, lips, tongue, or throat; etc.  Reduced immune function - report signs of infection such as fever; chills; body aches; very bad sore throat; ear or sinus pain; cough; more sputum or change in color of sputum; pain with passing urine; wound that will not heal, etc.  Also at a slightly higher risk of some malignancies (mainly skin and blood cancers) due to this reduced immune function.  A new skin growth or lump  Stomach pain that is new or worse or change in bowel habits  Signs of a blood clot - chest pain or pressure; coughing up  blood; shortness of breath; swelling, warmth, numbness, or pain in a leg or arm      Contraindications, Warnings, & Precautions     Have your bloodwork checked as you have been told by your prescriber  Talk with your doctor if you are pregnant, planning to become pregnant, or breastfeeding - women taking this medication must use birth control while taking this drug and for some time after the last dose  Discuss the possible need for holding your dose(s) of Rinvoq?? when a planned procedure is scheduled with the prescriber as it may delay healing/recovery timeline       Drug/Food Interactions     Medication list reviewed in Epic. The patient was instructed to inform the care team before taking any new medications or supplements.  Methotraxate, rinvoq, prednisone . Per note by Lottie Dawson, I have confirmed and reviewed with the patient to stop dupixent (no washout) and methotrexate (7 day washout) before starting rinvoq.Last dose of methotrexate was 2/18. He denies using prednisone for flares at this time.   Talk with you prescriber or pharmacist before receiving any live vaccinations while taking this medication and after you stop taking it    Storage, Handling Precautions, & Disposal     Store in the original container at room temperature and protect from moisture.      Current Medications (including OTC/herbals), Comorbidities and Allergies     Current Outpatient Medications   Medication Sig Dispense Refill    acetaminophen-codeine (TYLENOL #3) 300-30 mg per tablet Take 1 tablet by mouth every four (4) hours as needed for pain.      albuterol HFA 90 mcg/actuation inhaler Inhale 2 puffs.      calcium carbonate-vitamin D3 600 mg(1,500mg ) -800 unit Tab TAKE 1 TABLET BY MOUTH EVERY DAY 90 tablet 3    cetirizine (ZYRTEC) 10 MG tablet 1 tablet once a day if needed for runny nose or itchy eyes      clobetasol (TEMOVATE) 0.05 % ointment Apply topically Two (2) times a day. 120 g 1    dupilumab (DUPIXENT SYRINGE) 300 mg/2 mL Syrg injection Inject the contents of 1 syringe (300 mg total) under the skin every seven (7) days. 8 mL 11    EPINEPHrine (EPIPEN) 0.3 mg/0.3 mL injection Inject 0.3 mL (0.3 mg total) into the muscle once. for 1 dose In case of anaphylaxis 2 each 3    fluticasone propion-salmeterol (ADVAIR, WIXELA) 250-50 mcg/dose diskus Inhale 1 puff.      fluticasone propionate (FLONASE) 50 mcg/actuation nasal spray 2 spray per nostril once a day if needed for stuffy nose.      folic acid (FOLVITE) 1 MG tablet Please take 1 tablet (1 mg) on days that you are not taking Methotrexate 90 tablet 3    methotrexate 2.5 MG tablet Take 6 tablets by mouth once weekly 30 tablet 2    predniSONE (DELTASONE) 10 MG tablet Take 4 tablets (40 mg total) by mouth daily for 7 days, THEN 2 tablets (20 mg total) daily for 7 days, THEN 1 tablet (10 mg total) daily for 7 days. 49 tablet 0    triamcinolone (KENALOG) 0.1 % ointment Apply topically two (2) times a day to flares as needed 454 g 1    upadacitinib (RINVOQ) 15 mg Tb24 Take 1 tablet (15 mg) by mouth daily. 30 tablet 2     No current facility-administered medications for this visit.       Allergies   Allergen Reactions  Benadryl [Diphenhydramine Hcl] Swelling    Peanut Swelling and Other (See Comments)     Face    Peanut Oil Anaphylaxis     Pt has an epi pen  Pt has an epi pen    Soy Anaphylaxis    Cetirizine Other (See Comments)     Other reaction(s): Unknown    Coughing and eczema flares    Cyproheptadine Other (See Comments)     Other Reaction: Other reaction    Fish Containing Products Other (See Comments)     Unknown  Other reaction(s): UNKNOWN    Hydroxyzine Other (See Comments)    Lentils     Shellfish Containing Products Cough     Unknown    Nickel Rash       Patient Active Problem List   Diagnosis    Atopic dermatitis    Moderate persistent asthma without complication    Osteopenia       Medication list has been reviewed and updated in Epic: Yes    Allergies have been reviewed and updated in Epic: Yes    Appropriateness of Therapy     Acute infections noted within Epic:  No active infections  Patient reported infection: None    Is the medication and dose appropriate based on diagnosis, medication list, comorbidities, allergies, medical history, patient???s ability to self-administer the medication, and therapeutic goals? Yes    Prescription has been clinically reviewed: Yes      Baseline Quality of Life Assessment      How many days over the past month did your AD  keep you from your normal activities? For example, brushing your teeth or getting up in the morning. 0    Financial Information     Medication Assistance provided: Prior Authorization    Anticipated copay of $4 reviewed with patient. Verified delivery address.    Delivery Information     Scheduled delivery date: 2/27    Expected start date: ASAP      Medication will be delivered via UPS to the prescription address in [facility].  This shipment will not require a signature.      Explained the services we provide at [facility] and that each month we would call to set up refills.  Stressed importance of returning phone calls so that we could ensure they receive their medications in time each month.  Informed patient that we should be setting up refills 7-10 days prior to when they will run out of medication.  A pharmacist will reach out to perform a clinical assessment periodically.  Informed patient that a welcome packet, containing information about our pharmacy and other support services, a Notice of Privacy Practices, and a drug information handout will be sent.      The patient or caregiver noted above participated in the development of this care plan and knows that they can request review of or adjustments to the care plan at any time.      Patient or caregiver verbalized understanding of the above information as well as how to contact the pharmacy at [phone] option 4 with any questions/concerns.  The pharmacy is open Monday through Friday 8:30am-4:30pm.  A pharmacist is available 24/7 via pager to answer any clinical questions they may have.    Patient Specific Needs     Does the patient have any physical, cognitive, or cultural barriers? No    Does the patient have adequate living arrangements? (i.e. the ability to store and  take their medication appropriately) Yes    Did you identify any home environmental safety or security hazards? No    Patient prefers to have medications discussed with  Patient     Is the patient or caregiver able to read and understand education materials at a high school level or above? Yes    Patient's primary language is  English     Is the patient high risk? No    Does the patient have an additional or emergency contact listed in their chart? Yes    SOCIAL DETERMINANTS OF HEALTH     At the [facility], we have learned that life circumstances - like trouble affording food, housing, utilities, or transportation can affect the health of many of our patients.   That is why we wanted to ask: are you currently experiencing any life circumstances that are negatively impacting your health and/or quality of life? Patient declined to answer    Social Drivers of Health     Food Insecurity: Not on file   Internet Connectivity: Not on file   Housing/Utilities: Unknown (10/18/2021)    Housing/Utilities     Within the past 12 months, have you ever stayed: outside, in a car, in a tent, in an overnight shelter, or temporarily in someone else's home (i.e. couch-surfing)?: No     Are you worried about losing your housing?: Not on file     Within the past 12 months, have you been unable to get utilities (heat, electricity) when it was really needed?: Not on file   Tobacco Use: Low Risk  (08/19/2023)    Patient History     Smoking Tobacco Use: Never     Smokeless Tobacco Use: Never     Passive Exposure: Not on file   Transportation Needs: Not on file   Alcohol Use: Not on file   Interpersonal Safety: Not on file   Physical Activity: Not on file   Intimate Partner Violence: Not on file   Stress: Not on file   Substance Use: Not on file (05/05/2023)   Social Connections: Not on file   Financial Resource Strain: Not on file   Depression: Not on file   Health Literacy: Low Risk  (10/18/2021)    Health Literacy     : Never       Would you be willing to receive help with any of the needs that you have identified today? Not applicable       Norva Karvonen  [facility] Specialty Pharmacist

## 2023-08-29 NOTE — Progress Notes (Signed)
 FOLLOW UP Date of Service/Encounter:  08/30/23  Subjective:  Travis Morrison (DOB: 03-25-2001) is a 23 y.o. male who [facility] in re-evaluation of the following: asthma, allergic rhinitis, atopic dermatitis on Dupixent injections, and food allergy to fish, shellfish, lentils, and peanuts History obtained from: chart review and patient and father.  For Review, LV was on 01/01/23  with Thermon Leyland, FNP seen for routine follow-up. See below for summary of history and diagnostics.  This is my first encounter with this patient. ----------------------------------------------------- Pertinent History/Diagnostics:  Asthma: Current meds: Advair 250 2 puff BID, albuterol PRN. History of concern for compliance issues. Allergic Rhinitis:  Current plan: Flonase and cetirizine as needed  No recent environmental testing and not interested in updating. Food Allergy:  avoid fish, shellfish, lentils, and peanuts  Eczema: mostly experiences flares on bilateral hands for which he uses clobetasol on most days  Follows with dermatology, received duipxent 300 mg weekly.  On 08/06/23-dad reported that he would be starting a pill for eczema. Dupixent to be stopped.  --------------------------------------------------- Today presents for follow-up. Discussed the use of AI scribe software for clinical note transcription with the patient, who gave verbal consent to proceed.  History of Present Illness   The patient presents for follow-up regarding asthma and allergy management.  He uses Advair twice daily for asthma and has needed the albuterol inhaler infrequently, less than twice a week. No recent need for systemic steroids or antibiotics. He has not required prednisone recently, which was previously used for his skin condition.  He has a history of multiple allergies, including fish, shellfish, peanuts, and lentils, and avoids these foods with no recent accidental  exposures. He had a skin reaction to lemon lime juice. He is not currently taking cetirizine or Flonase and has not received allergy injections in the past. He has an EpiPen but has never used it.  He has a history of severe eczema managed by dermatology.  He is currently transitioning to Rinvoq for his skin condition.  He is not on methotrexate or folic acid anymore.       Chart Review: 08/06/23-300 mg weekly provided in clinic, rest of supply given to dad. Dad reported he would be starting a pill soon. Dermatology notes 08/16/23: still symptomatic on methotrexate, folic acid and dupixent weekly, also prescribed TAC and clobetasol. Plan to start Rinvoq 15 mg daily and stop methotrexate and dupixent.  All medications reviewed by clinical staff and updated in chart. No new pertinent medical or surgical history except as noted in HPI.  ROS: All others negative except as noted per HPI.   Objective:  BP 132/88   Pulse 77   Temp 98.2 F (36.8 C) (Temporal)   Resp 16   Ht 5\' 6"  (1.676 m)   Wt 202 lb (91.6 kg)   SpO2 98%   BMI 32.60 kg/m  Body mass index is 32.6 kg/m. Physical Exam: General Appearance:  Alert, cooperative, no distress, appears stated age  Head:  Normocephalic, without obvious abnormality, atraumatic  Eyes:  Conjunctiva clear, EOM's intact  Ears EACs normal bilaterally and normal TMs bilaterally  Nose: Nares normal, hypertrophic turbinates, normal mucosa, and no visible anterior polyps  Throat: Lips, tongue normal; teeth and gums normal, normal posterior oropharynx  Neck: Supple, symmetrical  Lungs:   clear to auscultation bilaterally, Respirations unlabored, no coughing  Heart:  regular rate and rhythm and no murmur, Appears well perfused  Extremities: No edema  Skin:  erythematous, dry patches scattered on face, trunk and bilateral upper extremities with excoriations.  Neurologic: No gross deficits   Labs:  Lab Orders  No laboratory test(s) ordered today     Spirometry:  Tracings reviewed. His effort: Good reproducible efforts. FVC: 3.86L FEV1: 3.13L, 83% predicted FEV1/FVC ratio: 0.81 Interpretation: Spirometry consistent with normal pattern.  Please see scanned spirometry results for details.  Assessment/Plan   Did discuss consideration of allergy injections to control for environmental allergy triggers which would help with allergic rhinitis, asthma and likely his eczema. He is not currently interested. He would need updated allergy testing-I suspect it will be panpositive due to eczematous state.  Asthma-at goal Continue Advair Diskus 250 mg-take 1 puff every 12 hours to prevent coughing or wheezing Continue albuterol 2 puffs every 4 hours if needed for wheezing or coughing spells or instead albuterol 0.083% 1 unit dose every 4 hours if needed. Try not to use this medication on a regular schedule if you are not coughing, wheezing or short of breath Use albuterol 2 puffs 5-15 minutes before exercise to decrease cough or wheeze  Allergic rhinitis-at goal Continue Flonase 2 sprays per nostril once a day if needed for stuffy nose Continue saline nasal rinses as needed for nasal symptoms. Use this before any medicated nasal sprays for best result Continue cetirizine 10 mg-take 1 tablet once a day only if needed for runny nose for itching For thick post nasal drainage, begin Mucinex [phone] mg twice a day for and increase hydration as tolerated until your nasal symptoms are resolved Consider updating your environmental allergy testing.  Remember to stop antihistamines for 3 days before the testing appointment  Atopic dermatitis-managed by dermatology-not at goal Continue on the treatment of your eczema prescribed by his dermatologist Now on Rinvoq 15 mg daily.  Food allergy-stable Continue to avoid fish, shellfish, peanut and lentils.  If he has an allergic reaction give Benadryl 50 mg every 4 hours and if he has life-threatening  symptoms inject with EpiPen 0.3 mg Consider updating your food allergy testing.  Remember to stop antihistamines for 3 days before your testing appointment  Continue the other medications as listed in the chart  Call the clinic if this treatment plan is not working well for you  Follow up in 6 months or sooner if needed It was a pleasure meeting you in clinic today! Thank you for allowing me to participate in your care.  Other: none  Tonny Bollman, MD  [facility]

## 2023-08-30 ENCOUNTER — Other Ambulatory Visit: Payer: Self-pay

## 2023-08-30 ENCOUNTER — Encounter: Payer: Self-pay | Admitting: Internal Medicine

## 2023-08-30 ENCOUNTER — Ambulatory Visit: Admitting: Internal Medicine

## 2023-08-30 VITALS — BP 132/88 | HR 77 | Temp 98.2°F | Resp 16 | Ht 66.0 in | Wt 202.0 lb

## 2023-08-30 DIAGNOSIS — T7800XD Anaphylactic reaction due to unspecified food, subsequent encounter: Secondary | ICD-10-CM

## 2023-08-30 DIAGNOSIS — J4540 Moderate persistent asthma, uncomplicated: Secondary | ICD-10-CM | POA: Diagnosis not present

## 2023-08-30 DIAGNOSIS — J301 Allergic rhinitis due to pollen: Secondary | ICD-10-CM

## 2023-08-30 DIAGNOSIS — L2084 Intrinsic (allergic) eczema: Secondary | ICD-10-CM | POA: Diagnosis not present

## 2023-08-30 MED ORDER — FLUTICASONE-SALMETEROL 250-50 MCG/ACT IN AEPB: 1.0000 | INHALATION_SPRAY | Freq: Two times a day (BID) | RESPIRATORY_TRACT | 6 refills | Status: DC

## 2023-08-30 MED ORDER — ALBUTEROL SULFATE HFA 108 (90 BASE) MCG/ACT IN AERS: 2.0000 | INHALATION_SPRAY | RESPIRATORY_TRACT | 6 refills | Status: DC | PRN

## 2023-08-30 MED ORDER — EPINEPHRINE 0.3 MG/0.3ML IJ SOAJ: INTRAMUSCULAR | 1 refills | Status: AC

## 2023-08-30 MED ORDER — FLUTICASONE PROPIONATE 50 MCG/ACT NA SUSP: NASAL | 5 refills | Status: DC

## 2023-08-30 NOTE — Patient Instructions (Addendum)
 Asthma Continue Advair Diskus 250 mg-take 1 puff every 12 hours to prevent coughing or wheezing Continue albuterol 2 puffs every 4 hours if needed for wheezing or coughing spells or instead albuterol 0.083% 1 unit dose every 4 hours if needed. Try not to use this medication on a regular schedule if you are not coughing, wheezing or short of breath Use albuterol 2 puffs 5-15 minutes before exercise to decrease cough or wheeze  Allergic rhinitis Continue Flonase 2 sprays per nostril once a day if needed for stuffy nose Continue saline nasal rinses as needed for nasal symptoms. Use this before any medicated nasal sprays for best result Continue cetirizine 10 mg-take 1 tablet once a day only if needed for runny nose for itching For thick post nasal drainage, begin Mucinex [phone] mg twice a day for and increase hydration as tolerated until your nasal symptoms are resolved Consider updating your environmental allergy testing.  Remember to stop antihistamines for 3 days before the testing appointment  Atopic dermatitis-managed by dermatology Continue on the treatment of your eczema prescribed by his dermatologist Now on Rinvoq 15 mg daily.  Food allergy Continue to avoid fish, shellfish, peanut and lentils.  If he has an allergic reaction give Benadryl 50 mg every 4 hours and if he has life-threatening symptoms inject with EpiPen 0.3 mg Consider updating your food allergy testing.  Remember to stop antihistamines for 3 days before your testing appointment  Continue the other medications as listed in the chart  Call the clinic if this treatment plan is not working well for you  Follow up in 6 months or sooner if needed It was a pleasure meeting you in clinic today! Thank you for allowing me to participate in your care.  Tonny Bollman, MD [facility]

## 2023-09-12 NOTE — Unmapped (Signed)
 Barry Bond's father reports the Rinvoq is working well and he's had no flares recently. He denies adverse effects. They'll plan to call to schedule followup/ aware of labs needed.     [facility] Delivery Pharm[facility] Clinical Assessment & Refill Coordination Note    Barry Kissick., DOB: 10-25-2000  Phone: There are no phone numbers on file.    All above HIPAA information was verified with patient's family member, father, Barry Bond.     Was a Nurse, learning disability used for this call? No    Specialty Medication(s):   Inflammatory Disorders: Rinvoq     Current Outpatient Medications   Medication Sig Dispense Refill    acetaminophen-codeine (TYLENOL #3) 300-30 mg per tablet Take 1 tablet by mouth every four (4) hours as needed for pain.      albuterol HFA 90 mcg/actuation inhaler Inhale 2 puffs.      calcium carbonate-vitamin D3 600 mg(1,500mg ) -800 unit Tab TAKE 1 TABLET BY MOUTH EVERY DAY 90 tablet 3    cetirizine (ZYRTEC) 10 MG tablet 1 tablet once a day if needed for runny nose or itchy eyes      clobetasol (TEMOVATE) 0.05 % ointment Apply topically Two (2) times a day. 120 g 1    dupilumab (DUPIXENT SYRINGE) 300 mg/2 mL Syrg injection Inject the contents of 1 syringe (300 mg total) under the skin every seven (7) days. 8 mL 11    EPINEPHrine (EPIPEN) 0.3 mg/0.3 mL injection Inject 0.3 mL (0.3 mg total) into the muscle once. for 1 dose In case of anaphylaxis 2 each 3    fluticasone propion-salmeterol (ADVAIR, WIXELA) 250-50 mcg/dose diskus Inhale 1 puff.      fluticasone propionate (FLONASE) 50 mcg/actuation nasal spray 2 spray per nostril once a day if needed for stuffy nose.      folic acid (FOLVITE) 1 MG tablet Please take 1 tablet (1 mg) on days that you are not taking Methotrexate 90 tablet 3    methotrexate 2.5 MG tablet Take 6 tablets by mouth once weekly 30 tablet 2    upadacitinib (RINVOQ) 15 mg Tb24 Take 1 tablet (15 mg) by mouth daily. 30 tablet 2     No current facility-administered medications for this visit.        Changes to medications: Barry Bond reports no changes at this time.    Medication list has been reviewed and updated in Epic: Yes    Allergies   Allergen Reactions    Benadryl [Diphenhydramine Hcl] Swelling    Peanut Swelling and Other (See Comments)     Face    Peanut Oil Anaphylaxis     Pt has an epi pen  Pt has an epi pen    Soy Anaphylaxis    Cetirizine Other (See Comments)     Other reaction(s): Unknown    Coughing and eczema flares    Cyproheptadine Other (See Comments)     Other Reaction: Other reaction    Fish Containing Products Other (See Comments)     Unknown  Other reaction(s): UNKNOWN    Hydroxyzine Other (See Comments)    Lentils     Shellfish Containing Products Cough     Unknown    Nickel Rash       Changes to allergies: No    Allergies have been reviewed and updated in Epic: Yes    SPECIALTY MEDICATION ADHERENCE     Rinvoq ~ 7 days left    Medication Adherence    Patient reported X missed  doses in the last month: 0  Specialty Medication: Rinvoq  Support network for adherence: family member          Specialty medication(s) dose(s) confirmed: Regimen is correct and unchanged.     Are there any concerns with adherence? No    Adherence counseling provided? Not needed    CLINICAL MANAGEMENT AND INTERVENTION      Clinical Benefit Assessment:    Do you feel the medicine is effective or helping your condition? Yes    Clinical Benefit counseling provided? Not needed    Adverse Effects Assessment:    Are you experiencing any side effects? No    Are you experiencing difficulty administering your medicine? No    Quality of Life Assessment:    Quality of Life    Rheumatology  Oncology  Dermatology  1. What impact has your specialty medication had on the symptoms of your skin condition (i.e. itchiness, soreness, stinging)?: Some  2. What impact has your specialty medication had on your comfort level with your skin?: Some  Cystic Fibrosis          How many days over the past month did your AD  keep you from your normal activities? For example, brushing your teeth or getting up in the morning. Patient declined to answer    Have you discussed this with your provider? Not needed    Acute Infection Status:    Acute infections noted within Epic:  No active infections    Patient reported infection: None    Therapy Appropriateness:    Is therapy appropriate based on current medication list, adverse reactions, adherence, clinical benefit and progress toward achieving therapeutic goals? Yes, therapy is appropriate and should be continued     Clinical Intervention:    Was an intervention completed as part of this clinical assessment? No    DISEASE/MEDICATION-SPECIFIC INFORMATION      N/A    Chronic Inflammatory Diseases: Have you experienced any flares in the last month? No  Has this been reported to your provider? No    PATIENT SPECIFIC NEEDS     Does the patient have any physical, cognitive, or cultural barriers? No    Is the patient high risk? No    Does the patient require physician intervention or other additional services (i.e., nutrition, smoking cessation, social work)? No    Does the patient have an additional or emergency contact listed in their chart? No, patient refused. (Father's number only contact)    SOCIAL DETERMINANTS OF HEALTH     At the [facility], we have learned that life circumstances - like trouble affording food, housing, utilities, or transportation can affect the health of many of our patients.   That is why we wanted to ask: are you currently experiencing any life circumstances that are negatively impacting your health and/or quality of life? Patient declined to answer    Social Drivers of Health     Food Insecurity: Not on file   Tobacco Use: Low Risk  (08/19/2023)    Patient History     Smoking Tobacco Use: Never     Smokeless Tobacco Use: Never     Passive Exposure: Not on file   Transportation Needs: Not on file   Alcohol Use: Not on file   Housing: Unknown (10/18/2021)    Housing Within the past 12 months, have you ever stayed: outside, in a car, in a tent, in an overnight shelter, or temporarily in someone else's home (i.e. couch-surfing)?: No  Are you worried about losing your housing?: Not on file   Physical Activity: Not on file   Utilities: Not on file   Stress: Not on file   Interpersonal Safety: Not on file   Substance Use: Not on file (05/05/2023)   Intimate Partner Violence: Not on file   Social Connections: Not on file   Financial Resource Strain: Not on file   Depression: Not on file   Internet Connectivity: Not on file   Health Literacy: Low Risk  (10/18/2021)    Health Literacy     : Never       Would you be willing to receive help with any of the needs that you have identified today? Not applicable       SHIPPING     Specialty Medication(s) to be Shipped:   Inflammatory Disorders: Rinvoq    Other medication(s) to be shipped: No additional medications requested for fill at this time     Changes to insurance: No    Cost and Payment: Patient has a copay of $4. They are aware and have authorized the pharmacy to charge the credit card on file.    Delivery Scheduled: Yes, Expected medication delivery date: 3/24.     Medication will be delivered via UPS to the confirmed prescription address in [facility].    The patient will receive a drug information handout for each medication shipped and additional FDA Medication Guides as required.  Verified that patient has previously received a Conservation officer, historic buildings and a Surveyor, mining.    The patient or caregiver noted above participated in the development of this care plan and knows that they can request review of or adjustments to the care plan at any time.      All of the patient's questions and concerns have been addressed.    Sita [facility]en [facility] Specialty Pharmacist

## 2023-09-13 MED FILL — RINVOQ 15 MG TABLET,EXTENDED RELEASE: ORAL | 60 days supply | Qty: 60 | Fill #1

## 2023-11-05 DIAGNOSIS — L209 Atopic dermatitis, unspecified: Principal | ICD-10-CM

## 2023-11-05 MED ORDER — RINVOQ 15 MG TABLET,EXTENDED RELEASE
ORAL_TABLET | Freq: Every day | ORAL | 0 refills | 30.00000 days | Status: CP
Start: 2023-11-05 — End: ?
  Filled 2023-11-11: qty 30, 30d supply, fill #0

## 2023-11-08 NOTE — Unmapped (Signed)
 [facility] Special[facility] and Home Delivery Pharmacy Refill Coordination Note    Specialty Medication(s) to be Shipped:   Inflammatory Disorders: Rinvoq    Other medication(s) to be shipped: No additional medications requested for fill at this time     Barry Bond., DOB: 07/06/2000  Phone: There are no phone numbers on file.      All above HIPAA information was verified with patient.     Was a Nurse, learning disability used for this call? No    Completed refill call assessment today to schedule patient's medication shipment from the [facility] and [facility] Delivery Pharmacy  [phone]).  All relevant notes have been reviewed.     Specialty medication(s) and dose(s) confirmed: Regimen is correct and unchanged.   Changes to medications: Edgerrin reports no changes at this time.  Changes to insurance: No  New side effects reported not previously addressed with a pharmacist or physician: None reported  Questions for the pharmacist: No    Confirmed patient received a Conservation officer, historic buildings and a Surveyor, mining with first shipment. The patient will receive a drug information handout for each medication shipped and additional FDA Medication Guides as required.       DISEASE/MEDICATION-SPECIFIC INFORMATION        N/A    SPECIALTY MEDICATION ADHERENCE     Medication Adherence    Patient reported X missed doses in the last month: 0  Specialty Medication: upadacitinib (RINVOQ) 15 mg Tb24  Patient is on additional specialty medications: No  Informant: patient  Support network for adherence: family member              Were doses missed due to medication being on hold? No    upadacitinib (RINVOQ) 15 mg Tb24 : 3 days of medicine on hand       REFERRAL TO PHARMACIST     Referral to the pharmacist: Not needed      [facility]     Shipping address confirmed in Epic.     Cost and Payment: Patient has a copay of $4. They are aware and have authorized the pharmacy to charge the credit card on file.    Delivery Scheduled: Yes, Expected medication delivery date: 5/20.     Medication will be delivered via UPS to the prescription address in Epic WAM.    [facility] and [facility]

## 2023-12-03 DIAGNOSIS — L209 Atopic dermatitis, unspecified: Principal | ICD-10-CM

## 2023-12-03 MED ORDER — RINVOQ 15 MG TABLET,EXTENDED RELEASE
ORAL_TABLET | Freq: Every day | ORAL | 0 refills | 30.00000 days
Start: 2023-12-03 — End: ?

## 2023-12-04 MED ORDER — RINVOQ 15 MG TABLET,EXTENDED RELEASE
ORAL_TABLET | Freq: Every day | ORAL | 0 refills | 30.00000 days | Status: CP
Start: 2023-12-04 — End: ?
  Filled 2023-12-17: qty 30, 30d supply, fill #0

## 2023-12-04 NOTE — Unmapped (Signed)
 Needs appt and labs

## 2023-12-04 NOTE — Unmapped (Signed)
 Called, left voicemail    Thanks  [place]

## 2023-12-04 NOTE — Unmapped (Signed)
 Appointment on 07/02    Thanks  Jearlean Mince

## 2023-12-12 NOTE — Unmapped (Signed)
 [facility] Refill Coordination Note    Specialty Medication(s) to be Shipped:   Inflammatory Disorders: Rinvoq    Other medication(s) to be shipped: No additional medications requested for fill at this time     Barry Bond., DOB: 29-Mar-2001  Phone: [phone] (home) [phone] (work)      All above HIPAA information was verified with patient.     Was a Nurse, learning disability used for this call? No    Completed refill call assessment today to schedule patient's medication shipment from the [facility] and Home Delivery Pharmacy  [phone]).  All relevant notes have been reviewed.     Specialty medication(s) and dose(s) confirmed: Regimen is correct and unchanged.   Changes to medications: Barry Bond reports no changes at this time.  Changes to insurance: No  New side effects reported not previously addressed with a pharmacist or physician: None reported  Questions for the pharmacist: No    Confirmed patient received a Conservation officer, historic buildings and a Surveyor, mining with first shipment. The patient will receive a drug information handout for each medication shipped and additional FDA Medication Guides as required.       DISEASE/MEDICATION-SPECIFIC INFORMATION        N/A    SPECIALTY MEDICATION ADHERENCE     Medication Adherence    Patient reported X missed doses in the last month: 0  Specialty Medication: upadacitinib (RINVOQ) 15 mg Tb24  Patient is on additional specialty medications: No  Informant: patient  Support network for adherence: family member              Were doses missed due to medication being on hold? No    upadacitinib (RINVOQ) 15 mg Tb24 : 12 days of medicine on hand       REFERRAL TO PHARMACIST     Referral to the pharmacist: Not needed      [facility]     Shipping address confirmed in Epic.     Cost and Payment: Patient has a copay of $4. They are aware and have authorized the pharmacy to charge the credit card on file.    Delivery Scheduled: Yes, Expected medication delivery date: 6/25.     Medication will be delivered via UPS to the prescription address in Epic WAM.    [facility] and [facility]

## 2023-12-25 ENCOUNTER — Encounter: Admit: 2023-12-25 | Discharge: 2023-12-25 | Payer: Medicaid (Managed Care)

## 2023-12-25 DIAGNOSIS — Z79899 Other long term (current) drug therapy: Principal | ICD-10-CM

## 2023-12-25 DIAGNOSIS — L209 Atopic dermatitis, unspecified: Principal | ICD-10-CM

## 2023-12-25 LAB — CBC W/ AUTO DIFF
BASOPHILS ABSOLUTE COUNT: 0 10*9/L (ref 0.0–0.1)
BASOPHILS RELATIVE PERCENT: 0.5 %
EOSINOPHILS ABSOLUTE COUNT: 0.7 10*9/L — ABNORMAL HIGH (ref 0.0–0.5)
EOSINOPHILS RELATIVE PERCENT: 8.7 %
HEMATOCRIT: 43.8 % (ref 39.0–48.0)
HEMOGLOBIN: 14.9 g/dL (ref 12.9–16.5)
LYMPHOCYTES ABSOLUTE COUNT: 1.4 10*9/L (ref 1.1–3.6)
LYMPHOCYTES RELATIVE PERCENT: 17.2 %
MEAN CORPUSCULAR HEMOGLOBIN CONC: 34.1 g/dL (ref 32.0–36.0)
MEAN CORPUSCULAR HEMOGLOBIN: 29 pg (ref 25.9–32.4)
MEAN CORPUSCULAR VOLUME: 85.1 fL (ref 77.6–95.7)
MEAN PLATELET VOLUME: 9.2 fL (ref 6.8–10.7)
MONOCYTES ABSOLUTE COUNT: 0.5 10*9/L (ref 0.3–0.8)
MONOCYTES RELATIVE PERCENT: 5.9 %
NEUTROPHILS ABSOLUTE COUNT: 5.5 10*9/L (ref 1.8–7.8)
NEUTROPHILS RELATIVE PERCENT: 67.7 %
PLATELET COUNT: 340 10*9/L (ref 150–450)
RED BLOOD CELL COUNT: 5.15 10*12/L (ref 4.26–5.60)
RED CELL DISTRIBUTION WIDTH: 13.1 % (ref 12.2–15.2)
WBC ADJUSTED: 8.2 10*9/L (ref 3.6–11.2)

## 2023-12-25 LAB — LIPID PANEL
CHOLESTEROL: 176 mg/dL (ref <200)
HDL CHOLESTEROL: 46 mg/dL (ref >40)
LDL CHOLESTEROL CALCULATED: 112 mg/dL — ABNORMAL HIGH (ref <100)
NON-HDL CHOLESTEROL: 130 mg/dL — ABNORMAL HIGH (ref <130)
TRIGLYCERIDES: 113 mg/dL (ref <150)

## 2023-12-25 LAB — AST: AST (SGOT): 27 U/L (ref <=34)

## 2023-12-25 LAB — ALT: ALT (SGPT): 31 U/L (ref 10–49)

## 2023-12-25 MED ORDER — TRIAMCINOLONE ACETONIDE 0.1 % TOPICAL OINTMENT
Freq: Two times a day (BID) | TOPICAL | 1 refills | 0.00000 days | Status: CP
Start: 2023-12-25 — End: 2024-12-24

## 2023-12-25 MED ORDER — UPADACITINIB ER 30 MG TABLET,EXTENDED RELEASE 24 HR
ORAL_TABLET | Freq: Every day | ORAL | 3 refills | 30.00000 days | Status: CP
Start: 2023-12-25 — End: ?
  Filled 2024-01-02: qty 30, 30d supply, fill #0

## 2023-12-25 MED ORDER — CLOBETASOL 0.05 % TOPICAL OINTMENT
Freq: Two times a day (BID) | TOPICAL | 1 refills | 0.00000 days | Status: CP
Start: 2023-12-25 — End: ?

## 2023-12-25 NOTE — Unmapped (Signed)
 Clinical Assessment Needed For: Dose Change  Medication: RINVOQ  30 mg tablet (upadacitinib )  Last Fill Date/Day Supply: 12/17/23 / 30  Copay $4   Was previous dose already scheduled to fill: No    Notes to Pharmacist: dose increase

## 2023-12-26 NOTE — Unmapped (Signed)
 I reviewed with Barry Bond's mother that the higher, 30 mg Rinvoq  dose has been approved. We discussed he can increase to this dose now by taking 2x15 mg pills, but when the 30 mg arrive, we need to drop back to ONE tablet daily. She expressed understanding.    [facility] Refill Coordination Note    Specialty Medication(s) to be Shipped:   Inflammatory Disorders: Rinvoq     Other medication(s) to be shipped: No additional medications requested for fill at this time     Barry Bond., DOB: July 04, 2000  Phone: [phone] (home) [phone] (work)      All above HIPAA information was verified with patient's family member, mother, Barry Bond.     Was a Nurse, learning disability used for this call? No    Completed refill call assessment today to schedule patient's medication shipment from the [facility]acy  [phone]).  All relevant notes have been reviewed.     Specialty medication(s) and dose(s) confirmed: dose increase with this fill   Changes to medications: Barry Bond reports no changes at this time.  Changes to insurance: No  New side effects reported not previously addressed with a pharmacist or physician: None reported  Questions for the pharmacist: No    Confirmed patient received a Conservation officer, historic buildings and a Surveyor, mining with first shipment. The patient will receive a drug information handout for each medication shipped and additional FDA Medication Guides as required.       DISEASE/MEDICATION-SPECIFIC INFORMATION        N/A    SPECIALTY MEDICATION ADHERENCE     Medication Adherence    Patient reported X missed doses in the last month: 0  Specialty Medication: Rinvoq   Support network for adherence: family member            Were doses missed due to medication being on hold? No    upadacitinib  (RINVOQ ) 15 mg Tb24 : ~ 12 days of medicine on hand (has used new bottle about a week, then remaining tablets will be doubling up)      REFERRAL TO PHARMACIST Referral to the pharmacist: Not needed      Barry Bond     Shipping address confirmed in Epic.     Cost and Payment: Patient has a copay of $4. They are aware and have authorized the pharmacy to charge the credit card on file.    Delivery Scheduled: Yes, Expected medication delivery date: 7/11.     Medication will be delivered via UPS to the prescription address in Epic WAM.    [facility]  Specialty Pharmacist

## 2023-12-31 NOTE — Unmapped (Signed)
 Dermatology Note     Assessment and Plan:      Assessment & Plan  Atopic dermatitis, severe, chronic and not at treatment goal, 70% BSA  - Has failed numerous previous therapies including dupixent  combined with methotrexate  15mg , and rinvoq  15mg  daily. Previously treated with prednisone  w/ shown decreased bone density  - Has had an extensive work up for immune deficiency including immunoglobulins, lymphocyte markers, mitogens, vaccine response, genetic testing, and skin biopsy all pointing to normal eczema.  - Increase Rinvoq  from 15mg  to 30 mg daily, expecting further symptom improvement with a similar safety profile.  - Continue triamcinolone  (KENALOG ) 0.1 % ointment BID.  - Continue clobetasoL  (TEMOVATE ) 0.05 % ointment BID to thicker areas of rash, avoid face and skin folds. Use under white gloves at night  - Recommend moisturizers such as CeraVe, Vanicream, and Vaseline for tough areas like feet and neck.  - Follow up in 4 months.      High risk medication use  - labs 07/2023 normal with exception of elevated triglycerides at 255mg /dL  - Lipid Panel; Future  - AST; Future  - ALT; Future  - CBC w/ Differential; Future  - negative quantiferon gold 07/2023        The patient was advised to call for an appointment should any new, changing, or symptomatic lesions develop.     RTC: Return for 4 mo fu rinvoq . or sooner as needed   _________________________________________________________________      Chief Complaint     Chief Complaint   Patient presents with    Dermatitis     Patient here for follow up on dermatitis. He states he is doing well.        HPI     Barry Bond. is a 23 y.o. male who presents as a returning patient (last seen 08/16/2023) to Dermatology for follow up of atopic dermatitis.     History of Present Illness  Barry Bond. is a 23 year old male with atopic dermatitis who presents for a follow-up visit.    He started Rinvoq  15 mg daily five months ago, which has improved his symptoms compared to his previous regimen of methotrexate  and injections. Despite this, he continues to experience significant itching and occasional breakouts.    His skin condition is exacerbated by chlorine exposure from swimming and sweating. He has used bleach baths in the past, which provided some relief. He continues to use clobetasol , a steroid cream, which he finds beneficial, and Vaseline for particularly dry areas, although he finds it greasy and messy.    Recent lab work from February 2025 showed a negative Quantiferon Gold test, mildly elevated triglycerides at 255 mg/dL, and normal kidney and liver function tests. Complete blood count was normal.    He uses CeraVe and Vanicream as moisturizers but has not been using them recently. Vaseline makes his shower slippery, which is a concern for him. He is currently using clobetasol  and triamcinolone  ointments for his skin condition.        The patient denies any other new or changing lesions or areas of concern.     Pertinent Past Medical History     No history of skin cancer    Problem List       Atopic dermatitis - Primary    Relevant Medications    upadacitinib  (RINVOQ ) 30 mg tablet    clobetasol  (TEMOVATE ) 0.05 % ointment    triamcinolone  (KENALOG ) 0.1 % ointment  Family History:   Negative for melanoma    Past Medical History, Family History, Social History, Medication List, Allergies, and Problem List were reviewed in the rooming section of Epic.     ROS: Other than symptoms mentioned in the HPI, no fevers, chills, or other skin complaints    Physical Examination     GENERAL: Well-appearing male in no acute distress, resting comfortably.  NEURO: Alert and oriented, answers questions appropriately  PSYCH: Normal mood and affect  SKIN: Examination of the face, neck, bilateral upper extremities, bilateral lower extremities, and hands was performed      Physical Exam  SKIN: estimated 70% body surface area affected with excoriated, fissuring, eczematous patches diffusely over body.    All areas not commented on are within normal limits or unremarkable      (Approved Template 03/07/2020)

## 2024-01-30 NOTE — Unmapped (Signed)
..  Barry Bond. has been contacted in regards to their refill of Rinvoq . At this time, they have declined refill due to patient having 23 doses remaining. Refill assessment call date has been updated per the patient's request.

## 2024-02-11 NOTE — Unmapped (Signed)
 [facility] Specialty and Home Delivery Pharmacy Refill Coordination Note    Specialty Medication(s) to be Shipped:   Inflammatory Disorders: Rinvoq     Other medication(s) to be shipped: No additional medications requested for fill at this time    Specialty Medications not needed at this time: N/A     Joshu Furukawa., DOB: 2000-08-23  Phone: [phone] (home) [phone] (work)      All above HIPAA information was verified with patient.     Was a Nurse, learning disability used for this call? No    Completed refill call assessment today to schedule patient's medication shipment from the [facility] and Home Delivery Pharmacy  [phone]).  All relevant notes have been reviewed.     Specialty medication(s) and dose(s) confirmed: Regimen is correct and unchanged.   Changes to medications: Andersson reports no changes at this time.  Changes to insurance: No  New side effects reported not previously addressed with a pharmacist or physician: None reported  Questions for the pharmacist: No    Confirmed patient received a Conservation officer, historic buildings and a Surveyor, mining with first shipment. The patient will receive a drug information handout for each medication shipped and additional FDA Medication Guides as required.       DISEASE/MEDICATION-SPECIFIC INFORMATION        N/A    SPECIALTY MEDICATION ADHERENCE     Medication Adherence    Patient reported X missed doses in the last month: 0  Specialty Medication: upadacitinib  (RINVOQ ) 30 mg tablet  Patient is on additional specialty medications: No  Informant: patient  Support network for adherence: family member              Were doses missed due to medication being on hold? No    RINVOQ  30 mg tablet (upadacitinib )  11 days of medicine on hand       REFERRAL TO PHARMACIST     Referral to the pharmacist: Not needed      [facility]     Shipping address confirmed in Epic.     Cost and Payment: Patient has a copay of $4.00. They are aware and have authorized the pharmacy to charge the credit card on file.    Delivery Scheduled: Yes, Expected medication delivery date: 02/13/2024.     Medication will be delivered via UPS to the prescription address in Epic WAM.    Nel[facility] Winf[facility]  Specialty Technician

## 2024-02-12 MED FILL — RINVOQ 30 MG TABLET,EXTENDED RELEASE: ORAL | 30 days supply | Qty: 30 | Fill #1

## 2024-03-04 ENCOUNTER — Ambulatory Visit: Admitting: Internal Medicine

## 2024-03-06 NOTE — Unmapped (Signed)
 [facility] Refill Coordination Note    Specialty Medication(s) to be Shipped:   Inflammatory Disorders: Rinvoq     Other medication(s) to be shipped: No additional medications requested for fill at this time    Specialty Medications not needed at this time: N/A     Barry Bond., DOB: 05-10-2001  Phone: [phone] (home) [phone] (work)      All above HIPAA information was verified with patient's family member, mom.     Was a Nurse, learning disability used for this call? No    Completed refill call assessment today to schedule patient's medication shipment from the [facility]  [phone]).  All relevant notes have been reviewed.     Specialty medication(s) and dose(s) confirmed: Regimen is correct and unchanged.   Changes to medications: Grayling reports no changes at this time.  Changes to insurance: No  New side effects reported not previously addressed with a pharmacist or physician: None reported  Questions for the pharmacist: No    Confirmed patient received a Conservation officer, historic buildings and a Surveyor, mining with first shipment. The patient will receive a drug information handout for each medication shipped and additional FDA Medication Guides as required.       DISEASE/MEDICATION-SPECIFIC INFORMATION        N/A    SPECIALTY MEDICATION ADHERENCE     Medication Adherence    Patient reported X missed doses in the last month: 0  Specialty Medication: upadacitinib : RINVOQ  30 mg tablet  Patient is on additional specialty medications: No  Support network for adherence: family member              Were doses missed due to medication being on hold? No    RINVOQ  30 mg tablet (upadacitinib ) : 18 doses of medicine on hand       REFERRAL TO PHARMACIST     Referral to the pharmacist: Not needed      SHIPPING     Shipping address confirmed in Epic.     Cost and Payment: Patient has a copay of $4. They are aware and have authorized the pharmacy to charge the credit card on file.    Delivery Scheduled: Yes, Expected medication delivery date: 09/23.     Medication will be delivered via UPS to the prescription address in Epic WAM.    Shreeya Recendiz   [place] [facility]  Specialty Technician

## 2024-03-16 MED FILL — RINVOQ 30 MG TABLET,EXTENDED RELEASE: ORAL | 30 days supply | Qty: 30 | Fill #2

## 2024-03-18 ENCOUNTER — Other Ambulatory Visit: Payer: Self-pay

## 2024-03-18 ENCOUNTER — Ambulatory Visit (INDEPENDENT_AMBULATORY_CARE_PROVIDER_SITE_OTHER): Admitting: Internal Medicine

## 2024-03-18 ENCOUNTER — Encounter: Payer: Self-pay | Admitting: Internal Medicine

## 2024-03-18 VITALS — BP 128/78 | HR 83 | Temp 98.5°F | Resp 18 | Wt 198.7 lb

## 2024-03-18 DIAGNOSIS — T7800XD Anaphylactic reaction due to unspecified food, subsequent encounter: Secondary | ICD-10-CM | POA: Diagnosis not present

## 2024-03-18 DIAGNOSIS — L2084 Intrinsic (allergic) eczema: Secondary | ICD-10-CM

## 2024-03-18 DIAGNOSIS — J4540 Moderate persistent asthma, uncomplicated: Secondary | ICD-10-CM | POA: Diagnosis not present

## 2024-03-18 DIAGNOSIS — J301 Allergic rhinitis due to pollen: Secondary | ICD-10-CM

## 2024-03-18 NOTE — Progress Notes (Signed)
 FOLLOW UP Date of Service/Encounter:   03/18/2024  Subjective:  Travis Morrison (DOB: [birth year]) is a 23 y.o. male who [facility]24 in re-evaluation of the following: acute visit for itching and rash History obtained from: chart review and patient and mother.  For Review, LV was on 08/30/23  with Dr.Jamariah Tony seen for routine follow-up. See below for summary of history and diagnostics.   Therapeutic plans/changes recommended: Asthma doing well with an FEV1 93%.  We discussed AIT and how this would help with his allergic rhinitis and likely asthma and eczema.  Eczema remains severe but is managed by dermatology. ----------------------------------------------------- Pertinent History/Diagnostics:  Asthma: Current meds: Advair 250 2 puff BID, albuterol  PRN. History of concern for compliance issues. Allergic Rhinitis:  Current plan: Flonase  and cetirizine  as needed  No recent environmental testing and not interested in updating. Food Allergy :  avoid fish, shellfish, lentils, and peanuts  Eczema: managed by dermatology mostly experiences flares on bilateral hands for which he uses clobetasol on most days  Follows with dermatology, received duipxent 300 mg weekly.  On 08/06/23-dad reported that he would be starting a pill for eczema. Dupixent  to be stopped.  08/30/23: reported being on rinvoq prescribed by dermatology. --------------------------------------------------- Today presents for follow-up. Discussed the use of AI scribe software for clinical note transcription with the patient, who gave verbal consent to proceed.  History of Present Illness Travis Morrison is a 23 year old male with eczema who presents with a flare-up of his condition.  Pruritus and eczematous flare - Widespread pruritus described as occurring 'just everywhere' - Ongoing flare-up of eczema despite current therapy - Clobetasol cream used daily for pruritus with  some improvement in itching - he reports being unsure if able to take antihistamines, they are listed under his allergy  list but neither he or his mom are able to give a clear history of reaction. - This is only my second encounter with him. - We do not manage his eczema.  He follows with dermatology at [facility] for that.  Current and prior eczema therapies - Reports that his Rinvoq increased to 30 mg in June 2025 with persistent symptoms  Specialist involvement and follow-up - Eczema management primarily under dermatology care - Follow-up dermatology appointment scheduled for November 12  He reports that his asthma and breathing have been fine. His mother also presents with an urticarial type rash and and states she was told this was secondary to gain detergent.   All medications reviewed by clinical staff and updated in chart. No new pertinent medical or surgical history except as noted in HPI.  ROS: All others negative except as noted per HPI.   Objective:  BP 128/78   Pulse 83   Temp 98.5 F (36.9 C) (Temporal)   Resp 18   Wt 198 lb 11.2 oz (90.1 kg)   SpO2 97%   BMI 32.07 kg/m  Body mass index is 32.07 kg/m. Physical Exam: General Appearance:  Alert, cooperative, no distress, appears stated age  Head:  Normocephalic, without obvious abnormality, atraumatic  Eyes:  Conjunctiva clear, EOM's intact  Nose: Nares normal, no rhinorrhea  Throat: Lips, tongue normal; teeth and gums normal,   Neck: Supple, symmetrical  Lungs:   , Respirations unlabored, no coughing  Heart:  regular rate and rhythm and no murmur, Appears well perfused  Extremities: No edema  Skin: Widespread erythema, lichenification and excoriations noted on face, trunk, upper extremities and exposed areas, similar to  previous exam.  Neurologic: No gross deficits   Labs:  Lab Orders  No laboratory test(s) ordered today     Assessment/Plan   Discussed that his itching is likely due to uncontrolled eczema.   As we are no longer managing his eczema I have encouraged him to reach out to his dermatologist.  I have encouraged him to use his clobetasol twice a day as needed.  I have also discouraged the use of systemic steroids due to potential for rebound symptoms. I did recommend the use of fragrance and dye free products for her skin.  We could consider patch testing in the future, but his skin would need to look significantly better than its current state. We discussed allergy  injections at his previous visit but they are not interested.  Atopic dermatitis-managed by dermatology-FLARE of Eczema; not controlled Continue on the treatment of your eczema prescribed by his dermatologist Now on Rinvoq 30 mg daily. Recommend following up with dermatology  Asthma-at goal Continue Advair Diskus 250 mg-take 1 puff every 12 hours to prevent coughing or wheezing Continue albuterol  2 puffs every 4 hours if needed for wheezing or coughing spells or instead albuterol  0.083% 1 unit dose every 4 hours if needed. Try not to use this medication on a regular schedule if you are not coughing, wheezing or short of breath Use albuterol  2 puffs 5-15 minutes before exercise to decrease cough or wheeze  Allergic rhinitis-at goal Continue Flonase  2 sprays per nostril once a day if needed for stuffy nose Continue saline nasal rinses as needed for nasal symptoms. Use this before any medicated nasal sprays for best result For thick post nasal drainage, begin Mucinex [phone] mg twice a day for and increase hydration as tolerated until your nasal symptoms are resolved Consider updating your environmental allergy  testing.  Remember to stop antihistamines for 3 days before the testing appointment  Food allergy -at goal Continue to avoid fish, shellfish, peanut  and lentils.  If he has an allergic reaction give Benadryl 50 mg every 4 hours and if he has life-threatening symptoms inject with EpiPen  0.3 mg Consider updating your food  allergy  testing.  Remember to stop antihistamines for 3 days before your testing appointment  Follow up : 6 months, sooner if needed It was a pleasure seeing you again in clinic today! Thank you for allowing me to participate in your care.  Rocky Endow, MD [facility]   Other: none  Rocky Endow, MD  [facility] [place] 

## 2024-03-18 NOTE — Patient Instructions (Addendum)
 Asthma Continue Advair Diskus 250 mg-take 1 puff every 12 hours to prevent coughing or wheezing Continue albuterol  2 puffs every 4 hours if needed for wheezing or coughing spells or instead albuterol  0.083% 1 unit dose every 4 hours if needed. Try not to use this medication on a regular schedule if you are not coughing, wheezing or short of breath Use albuterol  2 puffs 5-15 minutes before exercise to decrease cough or wheeze  Allergic rhinitis Continue Flonase  2 sprays per nostril once a day if needed for stuffy nose Continue saline nasal rinses as needed for nasal symptoms. Use this before any medicated nasal sprays for best result For thick post nasal drainage, begin Mucinex [phone] mg twice a day for and increase hydration as tolerated until your nasal symptoms are resolved Consider updating your environmental allergy  testing.  Remember to stop antihistamines for 3 days before the testing appointment  Atopic dermatitis-managed by dermatology-FLARE of Eczema; not controlled Continue on the treatment of your eczema prescribed by his dermatologist Now on Rinvoq 30 mg daily. Recommend following up with dermatology  Food allergy  Continue to avoid fish, shellfish, peanut  and lentils.  If he has an allergic reaction give Benadryl 50 mg every 4 hours and if he has life-threatening symptoms inject with EpiPen  0.3 mg Consider updating your food allergy  testing.  Remember to stop antihistamines for 3 days before your testing appointment  Continue the other medications as listed in the chart  Call the clinic if this treatment plan is not working well for you  Follow up in 6 months or sooner if needed It was a pleasure seeing you again in clinic today! Thank you for allowing me to participate in your care.  Rocky Endow, MD [facility] [place]

## 2024-03-24 DIAGNOSIS — L209 Atopic dermatitis, unspecified: Principal | ICD-10-CM

## 2024-03-24 DIAGNOSIS — Z79899 Other long term (current) drug therapy: Principal | ICD-10-CM

## 2024-03-24 NOTE — Unmapped (Signed)
 Dermatology Note     Assessment and Plan:      Atopic dermatitis, severe, chronic and not at treatment goal, 70% BSA  - Has failed numerous previous therapies including dupixent  combined with methotrexate  15mg , and rinvoq  15mg  daily. Previously treated with prednisone  w/ shown decreased bone density  - Has had an extensive work up for immune deficiency including immunoglobulins, lymphocyte markers, mitogens, vaccine response, genetic testing, and skin biopsy all pointing to normal eczema.  - Continue Rinvoq  30 mg daily, may need to consider medication change at next visit if not improved   - Continue triamcinolone  (KENALOG ) 0.1 % ointment BID. For one week, will trial home wet wraps. Instructions given   - Continue clobetasoL  (TEMOVATE ) 0.05 % ointment BID to thicker areas of rash, avoid face and skin folds. Use under white gloves at night  - Recommend using vaseline only as a moisturizer     The patient was advised to call for an appointment should any new, changing, or symptomatic lesions develop.     RTC: Return in about 4 weeks (around 04/21/2024) for Eczema. or sooner as needed   _________________________________________________________________      Chief Complaint     Chief Complaint   Patient presents with    Rash     Arms       HPI     History of Present Illness  Barry Bond. is a 23 year old male with eczema who presents with severe itching. He is accompanied by his mother.    He experiences severe itching associated with his eczema, describing it as 'itching bad'. He reports that he has tried various treatments to manage the symptoms.    He is currently taking Rinvoq  once daily, which he has been on since last year. He reports mixed results, noting that it sometimes helps, but other times it does not. He also uses clobetasol  cream, applying it to affected areas such as his back and stomach.    In addition to Rinvoq  and clobetasol , he has tried oatmeal baths, which provide some relief from itching. He previously used Dupixent  injections, which were very helpful, but he finds Rinvoq  easier to use.    He mentions a possible allergy to game and cats, although he has not been officially diagnosed with a cat allergy by an allergist. He lives with his mother.    The patient denies any other new or changing lesions or areas of concern.     Pertinent Past Medical History     Past Medical History, Family History, Social History, Medication List, Allergies, and Problem List were reviewed in the rooming section of Epic.     ROS: Other than symptoms mentioned in the HPI, no fevers, chills, or other skin complaints    Physical Examination     GENERAL: Well-appearing male in no acute distress, resting comfortably.  NEURO: Alert and oriented, answers questions appropriately  SKIN (Full Skin Exam): Examination of the face, eyelids, lips, nose, ears, neck, chest, abdomen, back, arms, legs, hands, feet, palms, soles, nails was performed  - severely lichenified and excoriated plaques over the arms, popliteal fossa, back, neck, and face    All areas not commented on are within normal limits or unremarkable      (Approved Template 03/07/2020)

## 2024-03-24 NOTE — Unmapped (Addendum)
 Every day for the next week (7 days)   - take a shower  - apply triamcinolone   to the entire body   - wet pajamas in warm water and wring out very well  - put pajamas on and sit for 1 hour, then change into dry clothes     Apply clobetasol  as needed     You can try this detergent:

## 2024-04-13 NOTE — Unmapped (Signed)
 [facility] Refill Coordination Note    Specialty Medication(s) to be Shipped:   Inflammatory Disorders: Rinvoq     Other medication(s) to be shipped: No additional medications requested for fill at this time    Specialty Medications not needed at this time: N/A     Barry Bond., DOB: March 03, 2001  Phone: [phone] (home) [phone] (work)      All above HIPAA information was verified with patient's family member, mom.     Was a Nurse, learning disability used for this call? No    Completed refill call assessment today to schedule patient's medication shipment from the [facility]  [phone]).  All relevant notes have been reviewed.     Specialty medication(s) and dose(s) confirmed: Regimen is correct and unchanged.   Changes to medications: Ramona reports no changes at this time.  Changes to insurance: No  New side effects reported not previously addressed with a pharmacist or physician: None reported  Questions for the pharmacist: No    Confirmed patient received a Conservation officer, historic buildings and a Surveyor, mining with first shipment. The patient will receive a drug information handout for each medication shipped and additional FDA Medication Guides as required.       DISEASE/MEDICATION-SPECIFIC INFORMATION        N/A    SPECIALTY MEDICATION ADHERENCE     Medication Adherence    Patient reported X missed doses in the last month: 0  Specialty Medication: RINVOQ  30 mg tablet (upadacitinib )  Patient is on additional specialty medications: No  Support network for adherence: family member              Were doses missed due to medication being on hold? No     RINVOQ  30 mg tablet (upadacitinib ): 7 days of medicine on hand     REFERRAL TO PHARMACIST     Referral to the pharmacist: Not needed      [facility]     Shipping address confirmed in Epic.     Cost and Payment: Patient has a copay of $4. They are aware and have authorized the pharmacy to charge the credit card on file.    Delivery Scheduled: Yes, Expected medication delivery date: 04/16/24.     Medication will be delivered via UPS to the prescription address in Epic WAM.    Dena LOISE Dolores   [facility]  Specialty Technician

## 2024-04-15 MED FILL — RINVOQ 30 MG TABLET,EXTENDED RELEASE: ORAL | 30 days supply | Qty: 30 | Fill #3

## 2024-05-06 DIAGNOSIS — L209 Atopic dermatitis, unspecified: Principal | ICD-10-CM

## 2024-05-06 MED ORDER — EBGLYSS PEN 250 MG/2 ML SUBCUTANEOUS PEN INJECTOR
SUBCUTANEOUS | 11 refills | 0.00000 days | Status: CP
Start: 2024-05-06 — End: ?
  Filled 2024-07-09: qty 8, 28d supply, fill #0

## 2024-05-06 NOTE — Progress Notes (Signed)
 Dermatology Note     Assessment and Plan:      Assessment & Plan  Atopic dermatitis, severe, chronic and not at treatment goal, 45% BSA  - Has failed numerous previous therapies including dupixent  weekly combined with methotrexate  15mg , and rinvoq  30mg  daily. Previously treated with prednisone  w/ shown decreased bone density  - Has had an extensive work up for immune deficiency including immunoglobulins, lymphocyte markers, mitogens, vaccine response, genetic testing, and skin biopsy all pointing to normal eczema.  iscussed potential benefits and risks of switching to Evglys, including conjunctivitis as a possible side effect. Shared decision-making with patient and father, who expressed interest in trying a new injectable therapy. Evglys is similar to Dupixent  and may offer better control of symptoms. If not covered, alternatives such as Trilokinumab or abrocitinib may be considered.  - START Ebglyss 500 mg at baseline, followed by 500mg  after 14 days, followed by 250 mg every two weeks thereafter.  - STOP Rinvoq  30 mg daily when received Ebglyss  - Continue triamcinolone  (KENALOG ) 0.1 % ointment BID. For one week, will trial home wet wraps. Instructions given   - Continue clobetasoL  (TEMOVATE ) 0.05 % ointment BID to thicker areas of rash, avoid face and skin folds. Use under white gloves at night  - Recommend using vaseline only as a moisturizer   - Adbry and Cibinqo on reserve    High risk medication use    - Lipid Panel 12/2023 acceptable to continue rinvoq   - AST and ALT normal 12/2023  - CBC w/ Differential 12/2023 normal with elevated eosinophils  - negative quantiferon gold 07/2023          The patient was advised to call for an appointment should any new, changing, or symptomatic lesions develop.     RTC: Return in about 2 months (around 07/06/2024) for In-person. or sooner as needed   _________________________________________________________________      Chief Complaint     Chief Complaint   Patient presents with    Follow-up     Rinvoq  is working well eating a lot per mom       HPI     Barry Bond. is a 23 y.o. male who presents as a returning patient (last seen 03/24/2024) to Dermatology for follow up of atopic dermatitis.Barry Bond    History of Present Illness  Barry Bond. is a 23 year old male with atopic dermatitis who presents with persistent skin flares and itching. He was referred by a specialist in [facility] for his asthma and skin issues.    He experiences persistent skin flares and itching, with a severity of 7 out of 10. The itching is widespread, affecting his face, neck, arms, and other areas. He applies a topical cream, which provides some relief. The condition of his skin fluctuates, with some areas improving while others remain affected.    His legs experience symptoms that 'come and go,' with some improvement noted in the popliteal fossae and ankles. His feet and genital area are clear of rashes.    He has a history of asthma, which is currently well-controlled. He has previously been on various medications, including prednisone , which he was advised to use sparingly due to potential side effects. He has been on Rinvoq  for approximately six months.    No issues with sleep, stating he sleeps well and is not woken up by itching. No problems in the groin or bottom area. His asthma is currently well-controlled.  The patient denies any other new or changing lesions or areas of concern.     Pertinent Past Medical History     No history of skin cancer    Problem List          Musculoskeletal and Integument    Atopic dermatitis - Primary    Relevant Medications    lebrikizumab-lbkz (EBGLYSS PEN) 250 mg/2 mL PnIj    lebrikizumab-lbkz (EBGLYSS PEN) 250 mg/2 mL PnIj       Family History:   Negative for melanoma    Past Medical History, Family History, Social History, Medication List, Allergies, and Problem List were reviewed in the rooming section of Epic.     ROS: Other than symptoms mentioned in the HPI, no fevers, chills, or other skin complaints    Physical Examination     GENERAL: Well-appearing male in no acute distress, resting comfortably.  NEURO: Alert and oriented, answers questions appropriately  PSYCH: Normal mood and affect  SKIN: Examination of the scalp, face, neck, chest, abdomen, back, bilateral upper extremities, bilateral lower extremities, and hands was performed      Physical Exam  SKIN: Diffuse eczematous patches with lichenification and excoriation on arms, ears, chest, abdomen, and face. Mild eczematous patches on shoulders and popliteal fossae. Scalp, back, feet, groin, and perineal area clear. Ankles improved.    All areas not commented on are within normal limits or unremarkable      (Approved Template 03/07/2020)

## 2024-05-07 DIAGNOSIS — L209 Atopic dermatitis, unspecified: Principal | ICD-10-CM

## 2024-05-07 MED ORDER — UPADACITINIB ER 30 MG TABLET,EXTENDED RELEASE 24 HR
ORAL_TABLET | Freq: Every day | ORAL | 1 refills | 30.00000 days | Status: CP
Start: 2024-05-07 — End: ?
  Filled 2024-05-14: qty 30, 30d supply, fill #0

## 2024-05-07 NOTE — Progress Notes (Signed)
 [facility] Specialty Medication Onboarding    Specialty Medication: Ebglys  Prior Authorization: Approved   Financial Assistance: No - copay  <$25  Copay/Day Supply: $4 / 28 dasys (LD & MD)    Insurance Restrictions: Yes - max 1 month supply     Notes to Pharmacist:   Credit Card on File: yes  Start Date on Rx:    Delivery Method (based on home address currently on file): UPS no restrictions      The triage team has completed the benefits investigation and has determined that the patient is able to fill this medication at [facility]. Please contact the patient to complete the onboarding or follow up with the prescribing physician as needed.

## 2024-05-07 NOTE — Progress Notes (Signed)
 [facility] Delivery Pharm[facility] Refill Coordination Note    Specialty Medication(s) to be Shipped:   Inflammatory Disorders: Rinvoq     Other medication(s) to be shipped: No additional medications requested for fill at this time    Specialty Medications not needed at this time: N/A     Barry Gries., DOB: February 06, 2001  Phone: [phone] (home) [phone] (work)      All above HIPAA information was verified with patient's family member, mom.     Was a nurse, learning disability used for this call? No    Completed refill call assessment today to schedule patient's medication shipment from the [facility]  [phone]).  All relevant notes have been reviewed.     Specialty medication(s) and dose(s) confirmed: Regimen is correct and unchanged.   Changes to medications: Barry Bond reports no changes at this time.  Changes to insurance: No  New side effects reported not previously addressed with a pharmacist or physician: None reported  Questions for the pharmacist: No    Confirmed patient received a Conservation Officer, Historic Buildings and a Surveyor, Mining with first shipment. The patient will receive a drug information handout for each medication shipped and additional FDA Medication Guides as required.       DISEASE/MEDICATION-SPECIFIC INFORMATION        N/A    SPECIALTY MEDICATION ADHERENCE     Medication Adherence    Patient reported X missed doses in the last month: 0  Specialty Medication: RINVOQ  30 mg tablet (upadacitinib )  Patient is on additional specialty medications: No  Support network for adherence: family member              Were doses missed due to medication being on hold? No     RINVOQ  30 mg tablet (upadacitinib ): 16 days of medicine on hand       REFERRAL TO PHARMACIST     Referral to the pharmacist: Not needed      [facility]     Shipping address confirmed in Epic.     Cost and Payment: Patient has a copay of $4. They are aware and have authorized the pharmacy to charge the credit card on file.    Delivery Scheduled: Yes, Expected medication delivery date: 05/15/24.  However, Rx request for refills was sent to the provider as there are none remaining.     Medication will be delivered via UPS to the prescription address in Epic WAM.    Dena LOISE Dolores   [facility]  Specialty Technician

## 2024-05-11 NOTE — Progress Notes (Signed)
 1/14 - spoke with mother to set up delivery. She reports she'll be taking Tita to his local allergy office for assistance with injections    [facility]    Patient Onboarding/Medication Counseling    Mr.Coldren is a 23 y.o. male with atopic dermatitis who I am counseling today on initiation of therapy.  I am speaking to the patient's family member, mother, Almarie.    Was a nurse, learning disability used for this call? No    Verified patient's date of birth / HIPAA.    Specialty medication(s) to be sent: Inflammatory Disorders: Ebglyss       Non-specialty medications/supplies to be sent: na      Medications not needed at this time: na       The patient declined counseling on medication administration, missed dose instructions, goals of therapy, warnings and precautions, and drug/food interactions because they were counseled and clinic and plan to go to allergy office for administration. The information in the declined sections below are for informational purposes only and was not discussed with patient.       Ebglyss  (lebrikizumab)    Medication & Administration     Dosage: Atopic dermatitis: Inject 500mg  (given as two 250mg  injections) under the skin at week 0 and week 2 as a loading dose, followed by 250mg  every other week until week 16 or later. Once clinical response is achieved, reduce to maintenance dose of 250mg  every 4 weeks.    Administration:       Prefilled Pen  Gather all supplies needed for injection on a clean, flat working surface: medication pen removed from packaging, alcohol swab, sharps container, etc.  Look at the medication label - look for correct medication, correct dose, and check the expiration date. Make sure the pen is intact.  Look at the medication - the liquid should appear clear and colorless to slightly yellow to slightly brown.   Select injection site - you can use the front of your thigh or your belly (but not the area 2 inches around your belly button); if someone else is giving you the injection you can also use your upper arm in the skin covering your triceps muscle  Prepare injection site - wash your hands and clean the skin at the injection site with an alcohol swab and let it air dry, do not touch the injection site again before the injection. *You do not need to warm your pen to room temperature.  Make sure pen is locked by ensuring the line on the end lines up with the picture of a lock. Uncap the pen by twisting off the gray base cap.  Place and hold the clear base flat and firmly against the skin.  Keep the clear base on the skin then turn the lock ring to the unlock position.   Press and hold the purple injection button and listen for 2 loud clicks, one to indicate the start of the injection and one to indicate the completion. The injection may take up to 14 seconds.   You will know the injectio nis complete when the gray plunger is visible.   Dispose of the used pen immediately in your sharps disposal container the needle will be covered automatically  If you see any blood at the injection site, press a cotton ball or gauze on the site and maintain pressure until the bleeding stops, do not rub the injection site      Adherence/Missed dose instructions:  contact your healthcare provider  for instructions    Goals of Therapy     -Reduce symptoms of pruritus and dermatitis  -Prevent exacerbations  -Minimize therapeutic risks  -Avoidance of long-term systemic and topical glucocorticoid use  -Maintenance of effective psychosocial functioning    Side Effects & Monitoring Parameters     Injection site reaction (redness, irritation, inflammation localized to the site of administration)      The following side effects should be reported to the provider:  Signs of a hypersensitivity reaction - rash; hives; itching; red, swollen, blistered, or peeling skin; wheezing; tightness in the chest or throat; difficulty breathing, swallowing, or talking; swelling of the mouth, face, lips, tongue, or throat; etc.  Eye pain or irritation or any visual disturbances  Dizziness or passing out  Stomach cramps  Shingles       Contraindications, Warnings, & Precautions     Have your bloodwork checked as you have been told by your prescriber   Talk with your doctor if you are pregnant, planning to become pregnant, or breastfeeding  Conjunctivitis and keratitis have been reported, report any new or worsening eye symptoms  Treat any preexisting parasitic infections prior to starting lebrikizumab      Drug/Food Interactions     Medication list reviewed in Epic. The patient was instructed to inform the care team before taking any new medications or supplements. No drug interactions identified.   Talk with you prescriber or pharmacist before receiving any live vaccinations while taking this medication and after you stop taking it    Storage, Handling Precautions, & Disposal     Store EBGLYSS  in the refrigerator.  Do not freeze  EBGLYSS  Is shipped on ice.  If needed, you may store at room temperature for up to 7 days  Store in original packaging, protected from light  Do not shake  Dispose of used syringes in a sharps disposal container            Current Medications (including OTC/herbals), Comorbidities and Allergies     Current Medications[1]    Allergies[2]    Problem List[3]    Medication list has been reviewed and updated in Epic: Yes    Allergies have been reviewed and updated in Epic: Yes    Appropriateness of Therapy     Acute infections noted within Epic:  No active infections  Patient reported infection: None    Is the medication and dose appropriate considering the patient???s diagnosis, treatment, and disease journey, comorbidities, medical history, current medications, allergies, therapeutic goals, self-administration ability, and access barriers? Yes    Prescription has been clinically reviewed: Yes      Baseline Quality of Life Assessment      How many days over the past month did your AD keep you from your normal activities? For example, brushing your teeth or getting up in the morning. Patient declined to answer    Financial Information     Medication Assistance provided: Prior Authorization    Anticipated copay of $4 reviewed with patient. Verified delivery address.    Delivery Information     Scheduled delivery date: 1/16    Expected start date: tbd - Mom will call allergy office for appt      Medication will be delivered via UPS to the prescription address in [facility].  This shipment will not require a signature.      Explained the services we provide at [facility] Delivery [facility] and that each month we would call to set up  refills.  Stressed importance of returning phone calls so that we could ensure they receive their medications in time each month.  Informed patient that we should be setting up refills 7-10 days prior to when they will run out of medication.  A pharmacist will reach out to perform a clinical assessment periodically.  Informed patient that a welcome packet, containing information about our pharmacy and other support services, a Notice of Privacy Practices, and a drug information handout will be sent.      The patient or caregiver noted above participated in the development of this care plan and knows that they can request review of or adjustments to the care plan at any time.      Patient or caregiver verbalized understanding of the above information as well as how to contact the pharmacy at [phone] option 4 with any questions/concerns.  The pharmacy is open Monday through Friday 8:30am-4:30pm.  A pharmacist is available 24/7 via pager to answer any clinical questions they may have.    Patient Specific Needs     Does the patient have any physical, cognitive, or cultural barriers? No    Does the patient have adequate living arrangements? (i.e. the ability to store and take their medication appropriately) Yes    Did you identify any home environmental safety or security hazards? No    Patient prefers to have medications discussed with  Patient     Is the patient or caregiver able to read and understand education materials at a high school level or above? Yes    Patient's primary language is  English     Is the patient high risk? No    Does the patient have an additional or emergency contact listed in their chart? Yes    SOCIAL DETERMINANTS OF HEALTH     At the [facility], we have learned that life circumstances - like trouble affording food, housing, utilities, or transportation can affect the health of many of our patients.   That is why we wanted to ask: are you currently experiencing any life circumstances that are negatively impacting your health and/or quality of life? Patient declined to answer    Social Drivers of Health     Food Insecurity: Not on file   Tobacco Use: Low Risk (05/06/2024)    Patient History     Smoking Tobacco Use: Never     Smokeless Tobacco Use: Never     Passive Exposure: Not on file   Transportation Needs: Not on file   Alcohol Use: Not on file   Housing: Unknown (10/18/2021)    Housing     Within the past 12 months, have you ever stayed: outside, in a car, in a tent, in an overnight shelter, or temporarily in someone else's home (i.e. couch-surfing)?: No     Are you worried about losing your housing?: Not on file   Physical Activity: Not on file   Utilities: Not on file   Stress: Not on file   Interpersonal Safety: Not on file   Substance Use: Not on file (05/05/2023)   Intimate Partner Violence: Not on file   Social Connections: Not on file   Financial Resource Strain: Not on file   Health Literacy: Low Risk (10/18/2021)    Health Literacy     : Never   Internet Connectivity: Not on file       Would you be willing to receive help with any of the needs that you have identified today? Not  applicable       [name] Specialty Pharmacist         [1]   Current Outpatient Medications Medication Sig Dispense Refill    acetaminophen-codeine (TYLENOL #3) 300-30 mg per tablet Take 1 tablet by mouth every four (4) hours as needed for pain.      albuterol  HFA 90 mcg/actuation inhaler Inhale 2 puffs.      calcium  carbonate-vitamin D3 600 mg(1,500mg ) -800 unit Tab TAKE 1 TABLET BY MOUTH EVERY DAY 90 tablet 3    cetirizine  (ZYRTEC ) 10 MG tablet 1 tablet once a day if needed for runny nose or itchy eyes      clobetasol  (TEMOVATE ) 0.05 % ointment Apply topically Two (2) times a day. 120 g 1    EPINEPHrine  (EPIPEN ) 0.3 mg/0.3 mL injection Inject 0.3 mL (0.3 mg total) into the muscle once. for 1 dose In case of anaphylaxis 2 each 3    fluticasone  propion-salmeterol (ADVAIR , WIXELA) 250-50 mcg/dose diskus Inhale 1 puff.      fluticasone  propionate (FLONASE) 50 mcg/actuation nasal spray 2 spray per nostril once a day if needed for stuffy nose.      folic acid  (FOLVITE ) 1 MG tablet PLEASE TAKE 1 TABLET (1 MG) ON DAYS THAT YOU ARE NOT TAKING METHOTREXATE  30 tablet 23    lebrikizumab-lbkz  (EBGLYSS  PEN) 250 mg/2 mL PnIj Inject 2 pens at day 0 and day 14 for loading 8 mL 0    lebrikizumab-lbkz  (EBGLYSS  PEN) 250 mg/2 mL PnIj Inject one pen every 14 days for maintenance doses 4 mL 11    triamcinolone  (KENALOG ) 0.1 % ointment Apply topically two (2) times a day. As needed to rash 454 g 1    upadacitinib  (RINVOQ ) 30 mg tablet Take 1 tablet (30 mg total) by mouth daily. 30 tablet 1     No current facility-administered medications for this visit.   [2]   Allergies  Allergen Reactions    Benadryl [Diphenhydramine Hcl] Swelling    Peanut Swelling and Other (See Comments)     Face    Peanut Oil Anaphylaxis     Pt has an epi pen  Pt has an epi pen    Soy Anaphylaxis    Cetirizine  Other (See Comments)     Other reaction(s): Unknown    Coughing and eczema flares    Cyproheptadine Other (See Comments)     Other Reaction: Other reaction    Fish Containing Products Other (See Comments)     Unknown  Other reaction(s): UNKNOWN Hydroxyzine Other (See Comments)    Lentils     Shellfish Containing Products Cough     Unknown    Nickel Rash   [3]   Patient Active Problem List  Diagnosis    Atopic dermatitis    Moderate persistent asthma without complication (HHS-HCC)    Osteopenia

## 2024-05-20 DIAGNOSIS — Z79899 Other long term (current) drug therapy: Principal | ICD-10-CM

## 2024-05-20 MED ORDER — FOLIC ACID 1 MG TABLET
ORAL_TABLET | 23 refills | 0.00000 days | Status: CP
Start: 2024-05-20 — End: ?

## 2024-05-28 NOTE — Progress Notes (Signed)
 Will continue Rinvoq  for now while working on coordination of Ebglyss  injection support with Allergy/Asthma office.      [facility] Delivery Pharm[facility] Refill Coordination Note    Specialty Medication(s) to be Shipped:   Inflammatory Disorders: Rinvoq     Other medication(s) to be shipped: No additional medications requested for fill at this time    Specialty Medications not needed at this time: N/A     Barry Bond., DOB: 09-06-2000  Phone: [phone] (home) [phone] (work)      All above HIPAA information was verified with patient's family member, mother.     Was a nurse, learning disability used for this call? No    Completed refill call assessment today to schedule patient's medication shipment from the [facility]  [phone]).  All relevant notes have been reviewed.     Specialty medication(s) and dose(s) confirmed: Regimen is correct and unchanged.   Changes to medications: Barry Bond reports no changes at this time.  Changes to insurance: No  New side effects reported not previously addressed with a pharmacist or physician: None reported  Questions for the pharmacist: No    Confirmed patient received a Conservation Officer, Historic Buildings and a Surveyor, Mining with first shipment. The patient will receive a drug information handout for each medication shipped and additional FDA Medication Guides as required.       DISEASE/MEDICATION-SPECIFIC INFORMATION        N/A    SPECIALTY MEDICATION ADHERENCE     Medication Adherence    Patient reported X missed doses in the last month: 0  Specialty Medication: Rinvoq   Support network for adherence: family member              Were doses missed due to medication being on hold? No    Rinvoq  30 mg: ~18 doses of medicine on hand     REFERRAL TO PHARMACIST     Referral to the pharmacist: Not needed      [facility]     Shipping address confirmed in Epic.     Cost and Payment: Patient has a copay of $4. They are aware and have authorized the pharmacy to charge the credit card on file.    Delivery Scheduled: Yes, Expected medication delivery date: 12/16.     Medication will be delivered via UPS to the prescription address in Epic WAM.    [facility]  Specialty Pharmacist

## 2024-06-08 MED FILL — RINVOQ 30 MG TABLET,EXTENDED RELEASE: ORAL | 30 days supply | Qty: 30 | Fill #1

## 2024-06-22 NOTE — Telephone Encounter (Signed)
 Call from patient's family leaving a message on nurse line.  Mom states wanted to know the status of delivery of injections for eczema.    Since sent to [facility] delivery pharmacy , will send message to that office.

## 2024-06-23 NOTE — Progress Notes (Signed)
 I spoke with Neven's mother Almarie to review that I had been unable to connect with the [facility] office, and I asked that she follow up with them if she'd still like for them to provide injection support (unclear if they allow brown bagging, or if we should deliver to clinic) I offered to ship Ebglyss  to home, but she declined, citing that they cannot perform injections independently. She asked if I could call her local pharmacy, who provides injection support for her - I encouraged her to call and ask since they won't be the dispensing pharmacy and this might be out of their protocol.    She'll plan to call the pharmacy back once ready for shipment.    Erminia Mcnew A. Claudene, PharmD, BCPS - Clinical Pharmacist   [facility]    [address] [place], [place]  [address]  t [phone], opt 4 then 2 - f [phone]

## 2024-07-01 ENCOUNTER — Ambulatory Visit: Admitting: Internal Medicine

## 2024-07-08 ENCOUNTER — Ambulatory Visit: Admitting: Internal Medicine

## 2024-07-08 ENCOUNTER — Encounter: Payer: Self-pay | Admitting: Internal Medicine

## 2024-07-08 VITALS — BP 100/64 | HR 74 | Temp 97.9°F | Resp 16 | Ht 66.0 in | Wt 189.0 lb

## 2024-07-08 DIAGNOSIS — L2084 Intrinsic (allergic) eczema: Secondary | ICD-10-CM | POA: Diagnosis not present

## 2024-07-08 DIAGNOSIS — T7800XD Anaphylactic reaction due to unspecified food, subsequent encounter: Secondary | ICD-10-CM | POA: Diagnosis not present

## 2024-07-08 DIAGNOSIS — J4540 Moderate persistent asthma, uncomplicated: Secondary | ICD-10-CM | POA: Diagnosis not present

## 2024-07-08 DIAGNOSIS — J301 Allergic rhinitis due to pollen: Secondary | ICD-10-CM | POA: Diagnosis not present

## 2024-07-08 MED ORDER — FLUTICASONE PROPIONATE 50 MCG/ACT NA SUSP: NASAL | 5 refills | Status: AC

## 2024-07-08 MED ORDER — ALBUTEROL SULFATE (2.5 MG/3ML) 0.083% IN NEBU: INHALATION_SOLUTION | RESPIRATORY_TRACT | 1 refills | Status: AC

## 2024-07-08 MED ORDER — NEFFY 2 MG/0.1ML NA SOLN: 1.0000 | NASAL | 1 refills | Status: AC | PRN

## 2024-07-08 MED ORDER — FLUTICASONE-SALMETEROL 250-50 MCG/ACT IN AEPB: 1.0000 | INHALATION_SPRAY | Freq: Two times a day (BID) | RESPIRATORY_TRACT | 6 refills | Status: AC

## 2024-07-08 MED ORDER — ALBUTEROL SULFATE HFA 108 (90 BASE) MCG/ACT IN AERS: 2.0000 | INHALATION_SPRAY | RESPIRATORY_TRACT | 6 refills | Status: AC | PRN

## 2024-07-08 NOTE — Patient Instructions (Addendum)
 Asthma Continue Advair Diskus 250 mg-take 1 puff every 12 hours to prevent coughing or wheezing Continue albuterol  2 puffs every 4 hours if needed for wheezing or coughing spells or instead albuterol  0.083% 1 unit dose every 4 hours if needed. Try not to use this medication on a regular schedule if you are not coughing, wheezing or short of breath Use albuterol  2 puffs 5-15 minutes before exercise to decrease cough or wheeze  Allergic rhinitis Continue Flonase  2 sprays per nostril once a day if needed for stuffy nose Continue saline nasal rinses as needed for nasal symptoms. Use this before any medicated nasal sprays for best result For thick post nasal drainage, begin Mucinex [phone] mg twice a day for and increase hydration as tolerated until your nasal symptoms are resolved Consider updating your environmental allergy  testing.  Remember to stop antihistamines for 3 days before the testing appointment  Atopic dermatitis-managed by dermatology-not controlled Continue on the treatment of your eczema prescribed by his dermatologist Has failed failed methotrexate and dupixent , failed high dose rinvoq Now on Rinvoq 30 mg daily. Plan to switch to Egylss. Would like to receive Ebglyss injections here.  Discussed that we would not be able to store for him, but we can have him bring the medication for his injection appointments.  Continue follow up with dermatology  Food allergy  Continue to avoid fish, shellfish, peanut  and lentils.  If he has an allergic reaction give Benadryl 50 mg every 4 hours and if he has life-threatening symptoms can use Neffy  nasal spray in case of accidental exposure Consider updating your food allergy  testing.  Remember to stop antihistamines for 3 days before your testing appointment  Continue the other medications as listed in the chart  Call the clinic if this treatment plan is not working well for you  Follow up in 6 months or sooner if needed It was a pleasure  seeing you again in clinic today! Thank you for allowing me to participate in your care.  Rocky Endow, MD [facility]

## 2024-07-08 NOTE — Progress Notes (Signed)
 "  FOLLOW UP Date of Service/Encounter:   07/08/2024  Subjective:  Travis Morrison (DOB: [birth year]) is a 24 y.o. male who [facility] in re-evaluation of the following: asthma, allergic rhinitis, food allergies, atopic dermatitis History obtained from: chart review and patient and mother.  For Review, LV was on 03/18/24  with Dr.Kailon Treese seen for acute visit for eczematous flare on rinvoq. See below for summary of history and diagnostics.   Therapeutic plans/changes recommended: discussed that his atopic dermatitis is now managed by dermatology and he is on rinvoq prescribed by dermatology; on 30 mg daily; recommended reaching out to his dermatologist and discussed considering patch testing once skin is cleared. AIT has also been discussed, but family has not been interested ----------------------------------------------------- Pertinent History/Diagnostics:  Asthma: Current meds: Advair 250 2 puff BID, albuterol  PRN. History of concern for compliance issues. Allergic Rhinitis:  Current plan: Flonase  and cetirizine  as needed  No recent environmental testing and not interested in updating. Food Allergy :  avoid fish, shellfish, lentils, and peanuts  Eczema: managed by dermatology mostly experiences flares on bilateral hands for which he uses clobetasol on most days  Follows with dermatology, received duipxent 300 mg weekly.  On 08/06/23-dad reported that he would be starting a pill for eczema. Dupixent  to be stopped.  08/30/23: reported being on rinvoq prescribed by dermatology. --------------------------------------------------- Today presents for follow-up. Discussed the use of AI scribe software for clinical note transcription with the patient, who gave verbal consent to proceed.  History of Present Illness Travis Morrison is a 24 year old male with asthma and eczema who presents for follow-up on his respiratory and skin  conditions.  Asthma - Uses Advair, one puff twice daily for maintenance therapy. - Occasional use of albuterol  inhaler and nebulizer, reserved for emergencies. - No recent need for rescue inhaler. - No emergency room or urgent care visits for respiratory symptoms since last visit. - No use of prednisone  since last visit. - Uses Mucinex for cough as needed when having increased mucus which is helpful.  Allergic rhinitis and environmental allergies - Uses Flonase  nasal spray intermittently; requests refill. - Has a cat at home and experiences sneezing. - still not interested in allergy  injections - No recent food allergy  reactions to fish, shellfish, peanuts, or other known allergens. - Has an EpiPen , last refilled in March of previous year.  Eczema - Manages eczema with Rinvoq, prescribed by dermatology. - not working, discussed switching to [employer] - family would like for him to get his injections here as it is closer to home - Not currently taking methotrexate. - Uses Dove soap and Vaseline with aloe for skin care. - Avoids products with fragrances or dyes. - No recent patch testing due to current skin condition.  Chart review:  - derm appt 05/06/24: excerpt from A/P: Atopic dermatitis, severe, chronic and not at treatment goal, 45% BSA - Has failed numerous previous therapies including dupixent  weekly combined with methotrexate 15mg , and rinvoq 30mg  daily. Previously treated with prednisone  w/ shown decreased bone density - Has had an extensive work up for immune deficiency including immunoglobulins, lymphocyte markers, mitogens, vaccine response, genetic testing, and skin biopsy all pointing to normal eczema. iscussed potential benefits and risks of switching to Evglys, including conjunctivitis as a possible side effect. Shared decision-making with patient and father, who expressed interest in trying a new injectable therapy. Evglys is similar to Dupixent  and may offer better  control of symptoms. If not covered,  alternatives such as Trilokinumab or abrocitinib may be considered. - START Ebglyss 500 mg at baseline, followed by 500mg  after 14 days, followed by 250 mg every two weeks thereafter. - STOP Rinvoq 30 mg daily when received Ebglyss - Continue triamcinolone  (KENALOG ) 0.1 % ointment BID. For one week, will trial home wet wraps. Instructions given  - Continue clobetasoL (TEMOVATE) 0.05 % ointment BID to thicker areas of rash, avoid face and skin folds. Use under white gloves at night - Recommend using vaseline only as a moisturizer  - Adbry and Cibinqo on reserve    All medications reviewed by clinical staff and updated in chart. No new pertinent medical or surgical history except as noted in HPI.  ROS: All others negative except as noted per HPI.   Objective:  BP 100/64   Pulse 74   Temp 97.9 F (36.6 C) (Temporal)   Resp 16   Ht 5' 6 (1.676 m)   Wt 189 lb (85.7 kg)   SpO2 99%   BMI 30.51 kg/m  Body mass index is 30.51 kg/m. Physical Exam: General Appearance:  Alert, cooperative, no distress, appears stated age  Head:  Normocephalic, without obvious abnormality, atraumatic  Eyes:  Conjunctiva clear, EOM's intact  Ears EACs normal bilaterally and normal TMs bilaterally  Nose: Nares normal, hypertrophic turbinates, normal mucosa, and no visible anterior polyps  Throat: Lips, tongue normal; teeth and gums normal, normal posterior oropharynx  Neck: Supple, symmetrical  Lungs:   clear to auscultation bilaterally, Respirations unlabored, no coughing  Heart:  regular rate and rhythm and no murmur, Appears well perfused  Extremities: No edema  Skin: Erythema of face, diffuse erythematous dry patches scattered over anterior and posterior trunk, bilateral upper and lower extermities, overlying excoriations on bilateral upper and lower extremities, upper back and chest, bilateral ear lobes with dry patches, face improved from prior visit  Neurologic:  No gross deficits   Labs:  Lab Orders  No laboratory test(s) ordered today    Spirometry:  Tracings reviewed. His effort: Good reproducible efforts. FVC: 4.48L FEV1: 3.33L, 82% predicted FEV1/FVC ratio: 0.74 Interpretation: Spirometry consistent with normal pattern.  Please see scanned spirometry results for details.   Assessment/Plan   Eczema not at goal and being managed by dermatology. We are happy to give injections, family will need to be in charge of logistics. Did reinforce no fragrances or dyes. Would stop aloe flavored vaseline. Would recommend AIT which may help with AD, but family not interested.  Would recommend patch testing, but discussed his back will need to be under better control for a valid test.   Asthma-at goal Continue Advair Diskus 250 mg-take 1 puff every 12 hours to prevent coughing or wheezing Continue albuterol  2 puffs every 4 hours if needed for wheezing or coughing spells or instead albuterol  0.083% 1 unit dose every 4 hours if needed. Try not to use this medication on a regular schedule if you are not coughing, wheezing or short of breath Use albuterol  2 puffs 5-15 minutes before exercise to decrease cough or wheeze  Allergic rhinitis-at goal Continue Flonase  2 sprays per nostril once a day if needed for stuffy nose Continue saline nasal rinses as needed for nasal symptoms. Use this before any medicated nasal sprays for best result For thick post nasal drainage, begin Mucinex [phone] mg twice a day for and increase hydration as tolerated until your nasal symptoms are resolved Consider updating your environmental allergy  testing.  Remember to stop antihistamines for 3 days before  the testing appointment  Atopic dermatitis-managed by dermatology-not controlled Continue on the treatment of your eczema prescribed by his dermatologist Has failed failed methotrexate and dupixent , failed high dose rinvoq Now on Rinvoq 30 mg daily. Plan to switch to Egylss.  Would like to receive Ebglyss injections here.  Discussed that we would not be able to store for him, but we can have him bring the medication for his injection appointments.  Continue follow up with dermatology  Food allergy -stable Continue to avoid fish, shellfish, peanut  and lentils.  If he has an allergic reaction give Benadryl 50 mg every 4 hours and if he has life-threatening symptoms can use Neffy  nasal spray in case of accidental exposure Consider updating your food allergy  testing.  Remember to stop antihistamines for 3 days before your testing appointment  Continue the other medications as listed in the chart  Call the clinic if this treatment plan is not working well for you  Follow up in 6 months or sooner if needed It was a pleasure seeing you again in clinic today! Thank you for allowing me to participate in your care.  Other: demonstration of Neffy  given in clinic today.  Rocky Endow, MD  [facility] [place]        "

## 2024-07-10 DIAGNOSIS — Z79899 Other long term (current) drug therapy: Principal | ICD-10-CM

## 2024-07-10 DIAGNOSIS — L209 Atopic dermatitis, unspecified: Principal | ICD-10-CM

## 2024-07-10 NOTE — Progress Notes (Signed)
 Dermatology Note     Assessment and Plan:      Assessment & Plan  Atopic dermatitis, severe, chronic and flaring, 60% BSA   - previous treatments: methotrexate  15mg  with dupixent  300mg  weekly, rinvoq  30mg ; Previously treated with prednisone  w/ shown decreased bone density, triamcinolone , clobetasol , protopic  oint; phototherapy at home not covered by insurance and not accessible in office  - Has had an extensive work up for immune deficiency including immunoglobulins, lymphocyte markers, mitogens, vaccine response, genetic testing, and skin biopsy all pointing to normal eczema. Recent allergy visit in care everywhere suggested patch testing.  - START Ebglyss  500 mg at baseline, followed by 500mg  after 14 days, followed by 250 mg every two weeks thereafter. Med was finally just shipped to patient home from [facility] specialty pharmacy. Patient has arranged for injections to be given at a local clinic as per family preference.  - Continue Rinvoq  30 mg daily until starting Ebglyss   - Continue triamcinolone  0.1% ointment twice daily as needed.  - Continue clobetasol  0.05% ointment twice daily as needed for thicker patches.  - Consider social work consult as I have concerns about patient/family ability to adhere to treatment regimen. Patient denies having a PCP.  - Will schedule follow-up with an MD in two months for further evaluation and treatment adjustment.      High risk medication use  - Lipid Panel 12/2023 acceptable to continue rinvoq   - AST and ALT normal 12/2023  - CBC w/ Differential 12/2023 normal with elevated eosinophils  - negative quantiferon gold 07/2023        The patient was advised to call for an appointment should any new, changing, or symptomatic lesions develop.     RTC: Return in about 2 months (around 09/07/2024) for fu atopic dermatitis with MD (not a PA). or sooner as needed   _________________________________________________________________      Chief Complaint     Chief Complaint   Patient presents with    Dermatitis     Follow up atopic dermatitis        HPI     Barry Bond. is a 24 y.o. male who presents as a returning patient (last seen 05/06/2024) to Dermatology for follow up of atopic dermatitis.SABRA    History of Present Illness  Barry Near. is a 24 year old male with eczema who presents for coordination of a new injection treatment. He is not accompanied by his mother, who is upstairs for her own appointment.    Eczematous dermatitis  - Eczema with inconsistent skin control despite current therapy  - Currently taking Rinvoq  30 mg daily  - Previously received Dupixent  injections in a clinic setting    Medication administration challenges  - Uncertainty regarding ability to self-administer new injection at home  - Inconsistent assistance from mother with medication management  - Occasionally requires mother's help to manage medications    Allergic symptoms  - Uses cetirizine  and a nasal spray for allergies, though he voices some uncertainty about what medications he takes and how often    Care coordination and support  - Lives with mother  - Relies on ride services for appointments  - Receives support from father in care        The patient denies any other new or changing lesions or areas of concern.     Pertinent Past Medical History       Past Medical History, Family History, Social History, Medication List, Allergies, and Problem List were reviewed in  the rooming section of Epic.     ROS: Other than symptoms mentioned in the HPI, no fevers, chills, or other skin complaints    Physical Examination     GENERAL: Well-appearing male in no acute distress, resting comfortably.  NEURO: Alert and oriented, answers questions appropriately  PSYCH: Normal mood and affect  SKIN: Examination of the face, neck, chest, abdomen, back, bilateral upper extremities, and bilateral lower extremities was performed      Physical Exam  SKIN: Extensive, excoriated, pink, eczematous and lichenified plaques over arms, face, neck, trunk. Legs not examined today.    All areas not commented on are within normal limits or unremarkable      (Approved Template 03/07/2020)

## 2024-07-14 NOTE — Telephone Encounter (Signed)
 Patient called for verification on medications. RN spoke with patient and his mother and told them to stop taking Rinvoq  today and take 2 syringes of Edglyss to the doctors appt tomorrow.

## 2024-07-15 ENCOUNTER — Ambulatory Visit

## 2024-07-15 DIAGNOSIS — L209 Atopic dermatitis, unspecified: Secondary | ICD-10-CM

## 2024-07-15 MED ORDER — LEBRIKIZUMAB-LBKZ 250 MG/2ML ~~LOC~~ SOAJ
500.0000 mg | Freq: Once | SUBCUTANEOUS | Status: AC
  Administered 2024-07-15 – 2024-07-29 (×2): 500 mg via SUBCUTANEOUS

## 2024-07-16 NOTE — Telephone Encounter (Addendum)
 Call from mother of patient stating he itched all night after beginning his medication.  Requested something to help the itching.  Tried to call patient 's mom back at number left on message , but went to generic vm.  LVM notifying that Dr's office returned call and to call us  back at [phone].    Mother returned call she missed earlier.  Tried to call her back but went to vm after 3rd ring.  Left message as above.

## 2024-07-27 NOTE — Progress Notes (Signed)
 Barry Bond and his mother report he did well with first load at Allergy office - they plan to go back for second half of load on 2/4 before transitioning to 1 injection every 14 days.    [facility] Clinical Assessment & Refill Coordination Note    Barry Bond., DOB: 03-Dec-2000  Phone: [phone] (home) [phone] (work)    All above HIPAA information was verified with patient.     Was a nurse, learning disability used for this call? No    Specialty Medication(s):   Inflammatory Disorders: Ebglyss      Current Medications[1]     Changes to medications: Amaree reports no changes at this time.    Medication list has been reviewed and updated in Epic: Yes    Allergies[2]    Changes to allergies: No    Allergies have been reviewed and updated in Epic: Yes    SPECIALTY MEDICATION ADHERENCE     Ebglyss  250 mg: 1 doses of medicine on hand     Specialty medication is an injection or given on a cycle: Yes, Next injection is scheduled for 2/4 (this delivery for doses on 2/18 and 3/4).    Medication Adherence    Patient reported X missed doses in the last month: 0  Specialty Medication: Ebglyss   Support network for adherence: family member          Specialty medication(s) dose(s) confirmed: Regimen is correct and unchanged.     Are there any concerns with adherence? No    Adherence counseling provided? Not needed    CLINICAL MANAGEMENT AND INTERVENTION      Clinical Benefit Assessment:    Do you feel the medicine is effective or helping your condition? Yes    Clinical Benefit counseling provided? Not needed    Adverse Effects Assessment:    Are you experiencing any side effects? No    Are you experiencing difficulty administering your medicine? No    Quality of Life Assessment:    Quality of Life    Rheumatology  Oncology  Dermatology  1. What impact has your specialty medication had on the symptoms of your skin condition (i.e. itchiness, soreness, stinging)?: Some  2. What impact has your specialty medication had on your comfort level with your skin?: Some  Cystic Fibrosis          How many days over the past month did your atopic dermatitis  keep you from your normal activities? For example, brushing your teeth or getting up in the morning. Patient declined to answer    Have you discussed this with your provider? Not needed    Acute Infection Status:    Acute infections noted within Epic:  No active infections    Patient reported infection: None    Therapy Appropriateness:    Is the medication and dose appropriate considering the patient???s diagnosis, treatment, and disease journey, comorbidities, medical history, current medications, allergies, therapeutic goals, self-administration ability, and access barriers? Yes, therapy is appropriate and should be continued     Clinical Intervention:    Was an intervention completed as part of this clinical assessment? No    DISEASE/MEDICATION-SPECIFIC INFORMATION      N/A    Chronic Inflammatory Diseases: Have you experienced any flares in the last month? No  Has this been reported to your provider? No    PATIENT SPECIFIC NEEDS     Does the patient have any physical, cognitive, or cultural barriers? No    Is the patient  high risk? No    Does the patient require physician intervention or other additional services (i.e., nutrition, smoking cessation, social work)? No    Does the patient have an additional or emergency contact listed in their chart? Yes    SOCIAL DETERMINANTS OF HEALTH     At the [facility], we have learned that life circumstances - like trouble affording food, housing, utilities, or transportation can affect the health of many of our patients.   That is why we wanted to ask: are you currently experiencing any life circumstances that are negatively impacting your health and/or quality of life? Patient declined to answer    Social Drivers of Health     Food Insecurity: Not on file   Tobacco Use: Low Risk (07/10/2024)    Patient History     Smoking Tobacco Use: Never     Smokeless Tobacco Use: Never     Passive Exposure: Not on file   Transportation Needs: Not on file   Alcohol Use: Not on file   Housing: Unknown (10/18/2021)    Housing     Within the past 12 months, have you ever stayed: outside, in a car, in a tent, in an overnight shelter, or temporarily in someone else's home (i.e. couch-surfing)?: No     Are you worried about losing your housing?: Not on file   Physical Activity: Not on file   Utilities: Not on file   Stress: Not on file   Interpersonal Safety: Not on file   Substance Use: Not on file (05/05/2023)   Intimate Partner Violence: Not on file   Social Connections: Not on file   Financial Resource Strain: Not on file   Health Literacy: Low Risk (10/18/2021)    Health Literacy     : Never   Internet Connectivity: Not on file       Would you be willing to receive help with any of the needs that you have identified today? Not applicable       SHIPPING     Specialty Medication(s) to be Shipped:   Inflammatory Disorders: Ebglyss     Other medication(s) to be shipped: No additional medications requested for fill at this time    Specialty Medications not needed at this time: N/A     Changes to insurance: No    Cost and Payment: Patient has a copay of $4. They are aware and have authorized the pharmacy to charge the credit card on file.    Delivery Scheduled: Yes, Expected medication delivery date: 2/6.     Medication will be delivered via UPS to the confirmed prescription address in [facility].    The patient will receive a drug information handout for each medication shipped and additional FDA Medication Guides as required.  Verified that patient has previously received a Conservation Officer, Historic Buildings and a Surveyor, Mining.    The patient or caregiver noted above participated in the development of this care plan and knows that they can request review of or adjustments to the care plan at any time.      All of the patient's questions and concerns have been addressed.    Toran [name] Specialty Pharmacist         [1]   Current Outpatient Medications   Medication Sig Dispense Refill    acetaminophen-codeine (TYLENOL #3) 300-30 mg per tablet Take 1 tablet by mouth every four (4) hours as needed for pain.  albuterol  2.5 mg /3 mL (0.083 %) nebulizer solution       albuterol  HFA 90 mcg/actuation inhaler Inhale 2 puffs.      calcium  carbonate-vitamin D3 600 mg(1,500mg ) -800 unit Tab TAKE 1 TABLET BY MOUTH EVERY DAY 90 tablet 3    cetirizine  (ZYRTEC ) 10 MG tablet 1 tablet once a day if needed for runny nose or itchy eyes      clobetasol  (TEMOVATE ) 0.05 % ointment Apply topically Two (2) times a day. 120 g 1    EPINEPHrine  (EPIPEN ) 0.3 mg/0.3 mL injection Inject 0.3 mL (0.3 mg total) into the muscle once. for 1 dose In case of anaphylaxis 2 each 3    fluticasone  propion-salmeterol (ADVAIR , WIXELA) 250-50 mcg/dose diskus Inhale 1 puff.      fluticasone  propionate (FLONASE) 50 mcg/actuation nasal spray 2 spray per nostril once a day if needed for stuffy nose.      folic acid  (FOLVITE ) 1 MG tablet PLEASE TAKE 1 TABLET (1 MG) ON DAYS THAT YOU ARE NOT TAKING METHOTREXATE  30 tablet 23    lebrikizumab-lbkz  (EBGLYSS  PEN) 250 mg/2 mL PnIj Inject one pen every 14 days for maintenance doses 4 mL 11    triamcinolone  (KENALOG ) 0.1 % ointment Apply topically two (2) times a day. As needed to rash 454 g 1    upadacitinib  (RINVOQ ) 30 mg tablet Take 1 tablet (30 mg total) by mouth daily.       No current facility-administered medications for this visit.   [2]   Allergies  Allergen Reactions    Benadryl [Diphenhydramine Hcl] Swelling    Peanut Swelling and Other (See Comments)     Face    Peanut Oil Anaphylaxis     Pt has an epi pen  Pt has an epi pen    Soy Anaphylaxis    Cetirizine  Other (See Comments)     Other reaction(s): Unknown    Coughing and eczema flares    Cyproheptadine Other (See Comments)     Other Reaction: Other reaction    Fish Containing Products Other (See Comments)     Unknown  Other reaction(s): UNKNOWN    Hydroxyzine Other (See Comments)    Lentils     Shellfish Containing Products Cough     Unknown    Nickel Rash

## 2024-07-29 ENCOUNTER — Ambulatory Visit

## 2024-07-29 DIAGNOSIS — L209 Atopic dermatitis, unspecified: Secondary | ICD-10-CM

## 2024-07-30 MED FILL — EBGLYSS PEN 250 MG/2 ML SUBCUTANEOUS PEN INJECTOR: 28 days supply | Qty: 4 | Fill #0

## 2024-07-31 ENCOUNTER — Telehealth: Payer: Self-pay | Admitting: Internal Medicine

## 2024-07-31 NOTE — Telephone Encounter (Signed)
 Tried to return call to Jay patient's mother and phone range and states call could not be completed at this time.

## 2024-07-31 NOTE — Telephone Encounter (Signed)
 Pt mother called about shots and requested a call back.

## 2024-08-12 ENCOUNTER — Ambulatory Visit

## 2024-09-16 ENCOUNTER — Ambulatory Visit: Admitting: Internal Medicine
